# Patient Record
Sex: Male | Born: 1955
Health system: Southern US, Community
[De-identification: ages and names within clinical notes are randomized; demographics above are authoritative.]

## PROBLEM LIST (undated history)

## (undated) ENCOUNTER — Emergency Department (HOSPITAL_COMMUNITY): Payer: Medicare Other

## (undated) DIAGNOSIS — I4891 Unspecified atrial fibrillation: Secondary | ICD-10-CM

## (undated) DIAGNOSIS — Q211 Atrial septal defect: Secondary | ICD-10-CM

## (undated) DIAGNOSIS — Q2112 Patent foramen ovale: Secondary | ICD-10-CM

## (undated) DIAGNOSIS — I340 Nonrheumatic mitral (valve) insufficiency: Secondary | ICD-10-CM

## (undated) DIAGNOSIS — Z8679 Personal history of other diseases of the circulatory system: Secondary | ICD-10-CM

## (undated) DIAGNOSIS — R011 Cardiac murmur, unspecified: Secondary | ICD-10-CM

## (undated) DIAGNOSIS — I712 Thoracic aortic aneurysm, without rupture, unspecified: Secondary | ICD-10-CM

## (undated) DIAGNOSIS — F5104 Psychophysiologic insomnia: Secondary | ICD-10-CM

## (undated) DIAGNOSIS — I499 Cardiac arrhythmia, unspecified: Secondary | ICD-10-CM

## (undated) HISTORY — DX: Thoracic aortic aneurysm, without rupture, unspecified: I71.20

## (undated) HISTORY — DX: Nonrheumatic mitral (valve) insufficiency: I34.0

## (undated) HISTORY — PX: CARDIOVERSION: SHX1299

## (undated) HISTORY — DX: Patent foramen ovale: Q21.12

## (undated) HISTORY — DX: Psychophysiologic insomnia: F51.04

## (undated) HISTORY — DX: Unspecified atrial fibrillation: I48.91

## (undated) HISTORY — DX: Personal history of other diseases of the circulatory system: Z86.79

## (undated) HISTORY — DX: Thoracic aortic aneurysm, without rupture: I71.2

## (undated) HISTORY — DX: Atrial septal defect: Q21.1

---

## 2008-07-12 ENCOUNTER — Ambulatory Visit: Payer: Self-pay | Admitting: Cardiovascular Disease

## 2008-07-12 ENCOUNTER — Encounter: Payer: Self-pay | Admitting: Emergency Medicine

## 2008-07-13 ENCOUNTER — Encounter: Payer: Self-pay | Admitting: Cardiology

## 2008-07-13 ENCOUNTER — Inpatient Hospital Stay (HOSPITAL_COMMUNITY): Admission: EM | Admit: 2008-07-13 | Discharge: 2008-07-15 | Payer: Self-pay | Admitting: Cardiology

## 2008-07-16 ENCOUNTER — Ambulatory Visit: Payer: Self-pay | Admitting: Cardiology

## 2008-07-16 LAB — CONVERTED CEMR LAB: Prothrombin Time: 16.4 s — ABNORMAL HIGH (ref 10.9–13.3)

## 2008-07-19 ENCOUNTER — Ambulatory Visit: Payer: Self-pay | Admitting: Cardiology

## 2008-07-19 LAB — CONVERTED CEMR LAB
INR: 2.5 — ABNORMAL HIGH (ref 0.8–1.0)
Prothrombin Time: 27.1 s — ABNORMAL HIGH (ref 10.9–13.3)

## 2008-07-26 ENCOUNTER — Ambulatory Visit: Payer: Self-pay | Admitting: Internal Medicine

## 2008-07-26 DIAGNOSIS — G4700 Insomnia, unspecified: Secondary | ICD-10-CM | POA: Insufficient documentation

## 2008-07-26 DIAGNOSIS — I4891 Unspecified atrial fibrillation: Secondary | ICD-10-CM | POA: Insufficient documentation

## 2008-07-28 ENCOUNTER — Ambulatory Visit: Payer: Self-pay | Admitting: Cardiology

## 2008-07-29 ENCOUNTER — Ambulatory Visit: Payer: Self-pay | Admitting: Cardiology

## 2008-08-05 ENCOUNTER — Ambulatory Visit: Payer: Self-pay | Admitting: Cardiology

## 2008-10-15 ENCOUNTER — Telehealth: Payer: Self-pay | Admitting: Internal Medicine

## 2009-03-14 ENCOUNTER — Encounter (INDEPENDENT_AMBULATORY_CARE_PROVIDER_SITE_OTHER): Payer: Self-pay | Admitting: *Deleted

## 2009-04-26 ENCOUNTER — Encounter: Payer: Self-pay | Admitting: Cardiology

## 2009-05-03 ENCOUNTER — Encounter (INDEPENDENT_AMBULATORY_CARE_PROVIDER_SITE_OTHER): Payer: Self-pay | Admitting: *Deleted

## 2009-05-24 ENCOUNTER — Encounter (INDEPENDENT_AMBULATORY_CARE_PROVIDER_SITE_OTHER): Payer: Self-pay | Admitting: *Deleted

## 2009-06-17 ENCOUNTER — Ambulatory Visit: Payer: Self-pay | Admitting: Internal Medicine

## 2009-06-17 DIAGNOSIS — J029 Acute pharyngitis, unspecified: Secondary | ICD-10-CM | POA: Insufficient documentation

## 2009-08-19 ENCOUNTER — Telehealth: Payer: Self-pay | Admitting: Cardiology

## 2009-08-29 DIAGNOSIS — R002 Palpitations: Secondary | ICD-10-CM

## 2009-08-29 DIAGNOSIS — R0602 Shortness of breath: Secondary | ICD-10-CM

## 2009-08-29 DIAGNOSIS — I080 Rheumatic disorders of both mitral and aortic valves: Secondary | ICD-10-CM

## 2009-08-29 DIAGNOSIS — I340 Nonrheumatic mitral (valve) insufficiency: Secondary | ICD-10-CM | POA: Insufficient documentation

## 2009-08-31 ENCOUNTER — Ambulatory Visit: Payer: Self-pay | Admitting: Cardiology

## 2009-08-31 ENCOUNTER — Encounter: Payer: Self-pay | Admitting: Cardiology

## 2009-09-23 ENCOUNTER — Telehealth: Payer: Self-pay | Admitting: Internal Medicine

## 2009-09-29 ENCOUNTER — Ambulatory Visit: Payer: Self-pay | Admitting: Cardiology

## 2009-09-29 ENCOUNTER — Ambulatory Visit (HOSPITAL_COMMUNITY)
Admission: RE | Admit: 2009-09-29 | Discharge: 2009-09-29 | Payer: Self-pay | Source: Home / Self Care | Admitting: Cardiology

## 2009-09-29 ENCOUNTER — Ambulatory Visit (HOSPITAL_COMMUNITY): Admission: RE | Admit: 2009-09-29 | Discharge: 2009-09-29 | Payer: Self-pay | Admitting: Cardiology

## 2009-09-29 ENCOUNTER — Ambulatory Visit: Payer: Self-pay

## 2009-09-29 ENCOUNTER — Encounter: Payer: Self-pay | Admitting: Cardiology

## 2009-09-30 ENCOUNTER — Ambulatory Visit: Payer: Self-pay | Admitting: Cardiology

## 2009-10-06 ENCOUNTER — Telehealth: Payer: Self-pay | Admitting: Internal Medicine

## 2009-10-06 ENCOUNTER — Telehealth: Payer: Self-pay | Admitting: Cardiology

## 2010-01-05 ENCOUNTER — Telehealth: Payer: Self-pay | Admitting: Cardiology

## 2010-01-10 ENCOUNTER — Telehealth: Payer: Self-pay | Admitting: Cardiology

## 2010-01-10 DIAGNOSIS — I714 Abdominal aortic aneurysm, without rupture, unspecified: Secondary | ICD-10-CM | POA: Insufficient documentation

## 2010-01-14 ENCOUNTER — Encounter
Admission: RE | Admit: 2010-01-14 | Discharge: 2010-01-14 | Payer: Self-pay | Source: Home / Self Care | Admitting: Cardiology

## 2010-05-10 ENCOUNTER — Telehealth: Payer: Self-pay | Admitting: Internal Medicine

## 2010-08-16 ENCOUNTER — Encounter: Payer: Self-pay | Admitting: Cardiology

## 2010-08-16 ENCOUNTER — Ambulatory Visit: Payer: Self-pay | Admitting: Cardiology

## 2010-08-16 ENCOUNTER — Encounter: Payer: Self-pay | Admitting: Internal Medicine

## 2010-08-16 ENCOUNTER — Ambulatory Visit: Payer: Self-pay | Admitting: Internal Medicine

## 2010-08-16 DIAGNOSIS — I712 Thoracic aortic aneurysm, without rupture: Secondary | ICD-10-CM | POA: Insufficient documentation

## 2010-10-17 NOTE — Progress Notes (Signed)
Summary: calling back-regarding test  Phone Note Call from Patient Call back at Home Phone [phone]   Caller: Patient Reason for Call: Talk to Nurse, Lab or Test Results Summary of Call: calling back regarding test. Initial call taken by: Lorne Skeens,  January 05, 2010 8:54 AM  Follow-up for Phone Call        [facility] Scherrie Bateman, LPN  January 05, 2010 10:19 AM SPOKE WITH PT HAS ALOT OF BILLING QUESTIONS RE MRA OF THORACIC  AORTA NOT SCHEDULED YET PENDING  ANSWERS RE COST Follow-up by: Scherrie Bateman, LPN,  January 05, 2010 10:51 AM  Additional Follow-up for Phone Call Additional follow up Details #1::        LMTCB Scherrie Bateman, LPN  January 05, 2010 1:00 PM ATTEMPTED TO CALL PT WITH APPROX LIST OF CHARGES FOF MRA THORACIC  SCAN COSTS 1230.00 WITH WITHOUT CONTRAST CONTRAST VARIES $5.00 PER CC AND THE COST OF TEST BEING READ  Additional Follow-up by: Scherrie Bateman, LPN,  January 05, 2010 2:39 PM    Additional Follow-up for Phone Call Additional follow up Details #2::    lmtcb Scherrie Bateman, LPN  January 09, 2010 3:11 PM  returning call, Johnny [name]  January 09, 2010 4:52 PM   Additional Follow-up for Phone Call Additional follow up Details #3:: Details for Additional Follow-up Action Taken: PT AWARE OF FEES WILL CALL [employer] TO SEE WHAT WILL COVER AND CALL BACK TO SCHEDULE Additional Follow-up by: Scherrie Bateman, LPN,  January 09, 2010 4:58 PM

## 2010-10-17 NOTE — Progress Notes (Signed)
Summary: Estazolam Refill  Phone Note Refill Request Message from:  Fax from Pharmacy on September 23, 2009 11:34 AM  Refills Requested: Medication #1:  estazolam 2 mg tab   Brand Name Necessary? No   Supply Requested: 1 month   Last Refilled: 01/18/2009  Method Requested: Electronic Next Appointment Scheduled: 09-29-09 930 cardiology  Initial call taken by: Roselle Locus,  September 23, 2009 11:34 AM  Follow-up for Phone Call        ok to refill x 3 Follow-up by: [name] DO,  September 23, 2009 1:11 PM  Additional Follow-up for Phone Call Additional follow up Details #1::        Rx called to pharmacy Additional Follow-up by: Glendell Docker CMA,  September 23, 2009 4:11 PM    New/Updated Medications: ESTAZOLAM 2 MG TABS (ESTAZOLAM) Take 1 tablet by mouth once a day Prescriptions: ESTAZOLAM 2 MG TABS (ESTAZOLAM) Take 1 tablet by mouth once a day  #30 x 2   Entered by:   Glendell Docker CMA   Authorized by:   [name] DO   Signed by:   Glendell Docker CMA on 09/23/2009   Method used:   Telephoned to ...       [facility] [address] (retail)       [address]       [place], [place]  [address]       Ph: [phone] or [phone]       Fax: [phone]   RxID:   [id number]

## 2010-10-17 NOTE — Progress Notes (Signed)
Summary: Estazolam Refill  Phone Note Call from Patient Call back at Home Phone [phone]   Caller: Patient Call For: [name] DO Reason for Call: Refill Medication Summary of Call: Patient called and left voice message requesting refill on the Estazolam.  Last office visit with Dr Artist Pais was October 06/17/2009. No future appointments of file Initial call taken by: Glendell Docker CMA,  May 10, 2010 12:42 PM  Follow-up for Phone Call        refill x 1 .   inform pt - he has to been seen within 1 yr for refills Follow-up by: [name] DO,  May 11, 2010 8:35 AM  Additional Follow-up for Phone Call Additional follow up Details #1::        call returned to patient  at [phone], he was informed rx sent to pharmacy and a appointment is needed  for future refills. Patient verbalized understanding and agrees Additional Follow-up by: Glendell Docker CMA,  May 11, 2010 9:33 AM    Prescriptions: ESTAZOLAM 2 MG TABS (ESTAZOLAM) Take 1 tablet by mouth once a day  #30 x 0   Entered by:   Glendell Docker CMA   Authorized by:   [name] DO   Signed by:   Glendell Docker CMA on 05/11/2010   Method used:   Telephoned to ...       [facility] [address] (retail)       [address]       [place], [place]  [address]       Ph: [phone] or [phone]       Fax: [phone]   RxID:   [id number]

## 2010-10-17 NOTE — Progress Notes (Signed)
Summary: returning call  Phone Note Call from Patient Call back at Work Phone [phone] Call back at ext [phone]   Caller: Patient Reason for Call: Talk to Nurse Summary of Call: returning call Initial call taken by: Migdalia Dk,  October 06, 2009 8:41 AM  Follow-up for Phone Call        Discussed results of stress echo and 2D-echo with pt. Explained to him that Dr. Jens Som would like pt to have MRA for furthur evaluation. Pt aware he would need blood work prior to test. Pt wants to think about this and check with his insurance before proceeding.  Will forward message to Stanton Kidney to follow up with pt. Dossie Arbour, RN, BSN  October 06, 2009 9:30 AM   Additional Follow-up for Phone Call Additional follow up Details #1::        Left message to call back Deliah Goody, RN  October 14, 2009 4:05 PM  Left message to call back Deliah Goody, RN  October 27, 2009 3:20 PM

## 2010-10-17 NOTE — Progress Notes (Signed)
Summary: Calling about MRA  Phone Note Call from Patient Call back at [facility] Phone [phone]   Caller: Patient Summary of Call: Pt calling regarding getting a MRA Initial call taken by: Judie Grieve,  January 10, 2010 8:35 AM  Follow-up for Phone Call        spoke with pt, the pt has decided to schedule MRA to check aortic root size. Deliah Goody, RN  January 10, 2010 3:39 PM     New Problems: ABDOMINAL ANEURYSM WITHOUT MENTION OF RUPTURE (ICD-441.4)   New Problems: ABDOMINAL ANEURYSM WITHOUT MENTION OF RUPTURE (ICD-441.4)

## 2010-10-17 NOTE — Assessment & Plan Note (Signed)
Summary: Johnny [name]   Visit Type:  Follow-up Primary Johnny [name]:  Johnny [name]   History of Present Illness: Johnny [name] is a pleasant gentleman with a history of atrial fibrillation and mitral regurgitation for followup. He had a TEE-guided cardioversion on July 13, 2008.  His TEE showed normal LV function.  He had prolapse of the posterior mitral valve leaflet with moderate mitral regurgitation (2+).  He also had a patent foramen ovale.  We subsequently proceeded with cardioversion successfully to sinus rhythm.  Also note, a TSH was within normal limits of 1.237. Echocardiogram in January of 2011 revealed normal LV function, dilated aortic root, mitral valve prolapse with moderate mitral regurgitation and mild left atrial enlargement. Stress echocardiogram in January of 2011 was normal. MRA of the thoracic aorta in April of 2011 showed aortic root of 4.4 cm. I last saw him in December of [address]. Since then the patient denies any dyspnea on exertion, orthopnea, PND, pedal edema, palpitations, syncope or chest pain.   Current Medications (verified): 1)  Cardizem Cd 120 Mg Xr24h-Cap (Diltiazem Hcl Coated Beads) .Marland Kitchen.. 1 Tab By Mouth Once Daily 2)  Multivitamins  Tabs (Multiple Vitamin) .... Take 1 Tablet By Mouth Once A Day 3)  Estazolam 2 Mg Tabs (Estazolam) .... Take 1 Tablet By Mouth Once A Day As Needed  Allergies: 1)  ! * Antihistamines  Past History:  Past Medical History: MITRAL REGURGITATION (ICD-396.3) INSOMNIA, CHRONIC (ICD-307.42) ATRIAL FIBRILLATION (ICD-427.31) Rheumatic Fever age 61  patent foramen ovale Dilated aortic root  Past Surgical History: Reviewed history from 06/17/2009 and no changes required. Cardioversion    Social History: Reviewed history from 06/17/2009 and no changes required. 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 Remaining systems are negative.   Vital Signs:  Patient profile:   55 year old male Height:      74 inches Weight:      218 pounds BMI:     28.09 Pulse rate:   66 / minute Pulse rhythm:   regular Resp:     18 per minute BP sitting:   106 / 80  (left arm) Cuff size:   large  Vitals Entered By: Vikki Ports (August 16, 2010 11:20 AM)  Physical Exam  General:  Well-developed well-nourished in no acute distress.  Skin is warm and dry.  HEENT is normal.  Neck is supple. No thyromegaly.  Chest is clear to auscultation with normal expansion.  Cardiovascular exam is regular rate and rhythm.  Abdominal exam nontender or distended. No masses palpated. Extremities show no edema. neuro grossly intact    EKG  Procedure date:  08/16/2010  Findings:      Sinus Rhythm with No ST Changes.  Impression & Recommendations:  Problem # 1:  ANEURYSM, THORACIC AORTIC (ICD-441.2) Plan followup MRA of the chest in April of 2012.  Problem # 2:  MITRAL REGURGITATION (ICD-396.3) Plan followup echocardiogram in January 2012. Orders: Echocardiogram (Echo)  Problem # 3:  ATRIAL FIBRILLATION (ICD-427.31) Patient remains in sinus rhythm. Continue Cardizem. He is not routinely taking an aspirin. I explained the risk of CVA and recommended he resume aspirin. He has no embolic risk factors and therefore does not require Coumadin.  Patient Instructions: 1)  Your physician wants you to follow-up in: ONE YEAR  You will receive a reminder letter in the mail two months in advance. If you don't receive a letter, please call our office to schedule the follow-up appointment. 2)  Your physician has requested that you have an echocardiogram.  Echocardiography is a painless test that uses sound waves to create images of your heart. It  provides your doctor with information about the size and shape of your heart and how well your heart's chambers and valves are working.  This procedure takes approximately one hour. There are no restrictions for this procedure.SCHEDULED IN JAN 3)  MRA OF CHEST IN APRIL

## 2010-10-17 NOTE — Progress Notes (Signed)
Summary: Sleep aid  Phone Note Call from Patient Call back at Work Phone [phone] Call back at ext [phone]   Caller: Patient Call For: [name] DO Summary of Call: patient called and left voice message stating he had touched base with Dr Artist Pais awhile back about his sleeping difficulty and he would like to know if Dr Artist Pais would prescribe him a sleep aid. Initial call taken by: Glendell Docker CMA,  October 06, 2009 5:54 PM  Follow-up for Phone Call        patient called back checking on the status of his rx request. Follow-up by: Glendell Docker CMA,  October 07, 2009 2:39 PM  Additional Follow-up for Phone Call Additional follow up Details #1::        refill estazolam x 3 Additional Follow-up by: [name] DO,  October 07, 2009 3:03 PM    Additional Follow-up for Phone Call Additional follow up Details #2::    attempted to contact patient at [phone] answer , voice message left  informing patient rx was called to pharmacy Follow-up by: Glendell Docker CMA,  October 07, 2009 4:25 PM  Prescriptions: ESTAZOLAM 2 MG TABS (ESTAZOLAM) Take 1 tablet by mouth once a day  #30 x 3   Entered by:   Glendell Docker CMA   Authorized by:   [name] DO   Signed by:   Glendell Docker CMA on 10/07/2009   Method used:   Telephoned to ...       [facility] [address] (retail)       [address]       [place], [place]  [address]       Ph: [phone] or [phone]       Fax: [phone]   RxID:   [id number]

## 2010-10-19 NOTE — Assessment & Plan Note (Signed)
Summary: med check/mhf   Vital Signs:  Patient profile:   55 year old male Height:      74 inches Weight:      210.25 pounds BMI:     27.09 Temp:     97.8 degrees F oral Pulse rate:   66 / minute Pulse rhythm:   regular Resp:     16 per minute BP sitting:   106 / 80  (left arm) Cuff size:   large  Vitals Entered By: Mervin Kung CMA Duncan Dull) (August 16, 2010 10:30 AM) CC: Pt here for follow up on medication. Needs refill on Estazolam. Is Patient Diabetic? No Pain Assessment Patient in pain? no        Primary Care Provider:  Dondra Spry DO  CC:  Pt here for follow up on medication. Needs refill on Estazolam..  History of Present Illness: chronic insomnia no trouble getting to sleep always gets up 4 AM  recently returned from trip to the beach did not have any trouble  has been taking estazolam for last 3 yrs as needed  feels disconnected when stressed worries when "I am not in control"   Preventive Screening-Counseling & Management  Alcohol-Tobacco     Smoking Status: never  Allergies: 1)  ! 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   Lungs:  normal respiratory effort and normal breath sounds.   Heart:  normal rate, regular rhythm, and no gallop.   Psych:  normally interactive, good eye contact, not anxious appearing, and not depressed appearing.     Impression & Recommendations:  Problem # 1:  INSOMNIA, CHRONIC (ICD-307.42) Assessment  Unchanged continue estazolam as needed question underlying anxiety d/o pt declines trial of SSRI  Orders: T-Basic Metabolic Panel [phone]) T-TSH [phone])  Problem # 2:  HEALTH MAINTENANCE EXAM (ICD-V70.0) Pt never went for screening colonoscopy.  we discussed other options for colon ca screening. He wants to investigate options before making decision.  Orders: T-Basic Metabolic Panel [phone]) T-Hepatic Function [phone]) T-Lipid Profile [phone]) T-TSH [phone]) CRP, high sensitivity-[facility] ([id number])  Complete Medication List: 1)  Cardizem Cd 120 Mg Xr24h-cap (Diltiazem hcl coated beads) .Marland Kitchen.. 1 tab by mouth once daily 2)  Multivitamins Tabs (Multiple vitamin) .... Take 1 tablet by mouth once a day 3)  Estazolam 2 Mg Tabs (Estazolam) .... Take 1 tablet by mouth once a day as needed  Patient Instructions: 1)  Please schedule a follow-up appointment in 1 year. 2)  Please schedule a follow-up appointment as needed. Prescriptions: ESTAZOLAM 2 MG TABS (ESTAZOLAM) Take 1 tablet by mouth once a day as needed  #30 x 5   Entered and Authorized by:   [name] DO   Signed by:   [name] DO on 08/16/2010   Method used:   Print then Give to Patient   RxID:   [id number]    Orders Added: 1)  T-Basic Metabolic Panel [[id number]] 2)  T-Hepatic Function [[id number]] 3)  T-Lipid Profile [[id number]] 4)  T-TSH [[id number]] 5)  CRP, high sensitivity-[facility] [[id number]]  6)  Est. Patient Level III [[address]109]    Contraindications/Deferment of Procedures/Staging:    Test/Procedure: FLU VAX    Reason for deferment: patient declined   Current Allergies (reviewed today): ! * ANTIHISTAMINES

## 2011-01-30 NOTE — Discharge Summary (Signed)
NAMEAJANI, [name] NO.:  [id number]   MEDICAL RECORD NO.:  [id number]          PATIENT TYPE:  INP   LOCATION:  2033                         FACILITY:  [facility]   PHYSICIAN:  Madolyn Frieze. Jens Som, MD, FACCDATE OF BIRTH:  1955-11-14   DATE OF ADMISSION:  07/13/2008  DATE OF DISCHARGE:  07/15/2008                               DISCHARGE SUMMARY   PROCEDURES:  1. Transesophageal echocardiogram.  2. Direct-current cardioversion.  3. A 2-D echocardiogram.   PRIMARY FINAL DISCHARGE DIAGNOSIS:  Paroxysmal atrial fibrillation with  rapid ventricular response.   SECONDARY DIAGNOSES:  1. Anticoagulation with Coumadin started this admission and to      continue for 4 weeks.  2. History of rheumatic fever at age 26.  3. Allergy or intolerance to codeine and antihistamines with      depression.   TIME OF DISCHARGE:  37 minutes.   HOSPITAL COURSE:  [name] is a 55 year old male with very little  previous cardiac history.  He had 3 or 4 days of palpitations and  dyspnea on exertion.  When his symptoms did not improve, he came to the  emergency room and was found to be in AFib with rapid ventricular  response with a heart rate in the 140s.  He was admitted for further  evaluation and treatment.   He was initially rate controlled with IV Cardizem.  He remained  symptomatic in atrial fibrillation despite heart rates in the normal  range.  He was scheduled for a TEE cardioversion, which was performed on  July 13, 2008.   He had normal left ventricular function with no left atrial appendage  thrombus and a normal right atrium and right ventricle as well.  There  was no pericardial effusion.  He had prolapse of the posterior mitral  leaflet with moderate MR, which was 2+.  He had a patent foramen ovale  by color Doppler.  He was direct-current cardioverted with 120 biphasic  joules.  He went into sinus rhythm.  His Cardizem was transitioned to  p.o.   [name] was  started on Lovenox and transitioned to Coumadin.  In the  hospital, he received 10 mg on July 13, 2008, and 10 mg on July 14, 2008.  He is to take 10 mg on July 15, 2008.  His INR at  discharge is 1.2.  He is to continue the Lovenox until his INR is  therapeutic and the patient understands this.   [name] had been placed on Cardizem at 60 mg q.6 h.; however, he did  not tolerate this dose and had a vagal response with heart rates  dropping into the 40s, associated weakness and diaphoresis.  The  Cardizem dose was decreased to Cardizem CD 120 mg daily and he tolerated  this well.  Labs were checked including cardiac enzymes, which were  negative.  His BNP was slightly elevated at 614, but a chest x-ray  showed no active disease and he was not volume overloaded by exam.  A  TSH was within normal limits at 1.237 and a D-dimer was also within  normal limits at 0.23.  A lipid profile showed a total cholesterol of  154, triglycerides 100, HDL 31, and LDL 103.  Magnesium was within  normal limits as well.   On July 15, 2008, [name] was tolerating the Cardizem at 120 mg  daily.  He had no further episodes of hypotension and bradycardia.  He  had no indications of any kind of neurologic event.  Dr. Jens Som felt  that the plan should be to continue Coumadin for 4 weeks but then  discontinue it and began aspirin at 325 mg daily because his [place] score  is 0.  [name] requested an outpatient referral and he has been set up  with Dr. Artist Pais.  His echocardiogram is to be repeated in 6 months and  consideration can be given to a Myoview as an outpatient.  On July 15, 2008, Dr. Jens Som evaluated [name] and considered him stable for  discharge with close outpatient followup.   DISCHARGE INSTRUCTIONS:  1. His activity level is to be increased gradually.  He is to call our      office for any further palpitations or neurologic symptoms.  He is      to follow up with Dr. Jens Som  on July 28, 2008, at 9:45.  He      is to see Dr. Artist Pais as a new patient on July 26, 2008, at 9:30.      [facility] on July 16, 2008,      at 9:15.   DISCHARGE MEDICATIONS:  1. Mucinex p.r.n.  2. Coumadin 5 mg as directed, 2 tablets today.  3. Cardizem CD 120 mg daily.  4. Lovenox 95 mg every 12 hours until INR is greater than or equal to      2.0.      Johnny Demark, PA-C      Madolyn Frieze. Jens Som, MD, [facility]  Electronically Signed    RB/MEDQ  D:  07/15/2008  T:  07/15/2008  Job:  [id number]   cc:   Barbette Hair. Artist Pais, DO

## 2011-01-30 NOTE — H&P (Signed)
NAMELABIB, CWYNAR NO.:  [id number]   MEDICAL RECORD NO.:  [id number]          PATIENT TYPE:  INP   LOCATION:  2033                         FACILITY:  [facility]   PHYSICIAN:  Christell Faith, MD   DATE OF BIRTH:  12-Aug-1956   DATE OF ADMISSION:  07/13/2008  DATE OF DISCHARGE:                              HISTORY & PHYSICAL   CHIEF COMPLAINT:  Chest fluttering and shortness of breath.   HISTORY OF PRESENT ILLNESS:  This is a 55 year old white man, who  presented to the [facility] in [facility], complaining  of 3-4 days of chest fluttering, fast heart rate, dyspnea on exertion,  chest burning pain, and orthostatic lightheadedness.  He was found to be  in new-onset atrial fibrillation with rapid ventricular response with  rates in the 140s.  He is currently rate controlled on IV diltiazem and  is feeling much better although he is still in atrial fibrillation.  He  denies any recent fever, viral illness, travel, or angina.   PAST MEDICAL HISTORY:  Rheumatic fever at age 23.   SOCIAL HISTORY:  Lives in [place] with his wife.  He manages  [employer], has 2 daughters, lifelong nonsmoker, nondrinker,  no drugs.  He has fairly heavy consumption of caffeinated tea and plays  tennis for exercise.   FAMILY HISTORY:  Mother died of pulmonary fibrosis.  Father is alive and  has diverticulitis.   ALLERGIES:  No true allergies.  He stays away from medicines that make  him feel depressed such as codeine and antihistamine.   MEDICINE:  P.r.n. Mucinex.   REVIEW OF SYSTEMS:  Positive for 4 days of chest pain, shortness of  breath, dyspnea on exertion, presyncope, palpitations.  In general,  positive for tendency towards depression, positive for increased stress  secondary to financial situation, and positive for occasional bright red  blood per rectum.   PHYSICAL EXAMINATION:  VITAL SIGNS:  Temperature 98.4; pulse initially  149, currently  94; respiratory rate 16, blood pressure 106/57;  saturation 95% on room air; and weight 95 kg.  GENERAL:  A pleasant white man in no acute distress.  HEENT:  Pupils equal, round, and reactive to light.  Sclerae are clear.  Mucous membranes are moist.  No oral lesions.  Dentition is good.  NECK:  Supple.  Neck veins are flat.  No carotid bruits.  Carotid  upstrokes are normal with palpitation.  CARDIAC:  Rhythm is irregularly irregular.  Rate is controlled.  No  murmurs.  LUNGS:  Clear to auscultation bilaterally without wheezing or rales.  ABDOMEN:  Soft, nontender, and nondistended with no bruits.  No  hepatosplenomegaly.  EXTREMITIES:  No clubbing or edema.  2+ dorsalis pedis pulses  bilaterally.  1+ radial pulses bilaterally.  NEUROLOGIC:  Awake, alert, and oriented x3.  Cranial nerves symmetric  and normal, and 5/5 strength in all 4 extremities.   DIAGNOSTIC TESTS:  Chest x-ray shows no active disease.  EKG shows  atrial fibrillation/flutter at 127 beats per minute with no ischemic  changes.   LABORATORY:  White  blood cells 6.9, hemoglobin 14.6, and platelets 198.  Sodium 141, potassium 3.7, BUN 12, creatinine 1, and glucose 82.  CK-MB  1.1 and troponin less than 0.05.   IMPRESSION:  A 55 year old white man with new-onset atrial fibrillation.  Assuming this is not valvular related, then his CHADS2 score should be  quite low.   PLAN:  1. Admit to telemetry bed and rule out myocardial infarction by      cycling serial EKGs and cardiac enzymes.  2. TSH is pending.  3. Check transthoracic echo.  4. We will treat with IV Lovenox for now and initiate Coumadin in      anticipation of a few weeks of Coumadin post cardioversion after      which he may be able to transition to aspirin.  5. Continue IV diltiazem overnight, and if he does not spontaneously      convert, then he will be kept n.p.o. for potential TEE      cardioversion tomorrow.  6. He would prefer to take calcium  channel blockers long term as      opposed to beta blockers given the potential depressive side      effects of beta blockers.  7. Given the significant chest pain component, he may require an      outpatient stress test, and we will go ahead and check a D-dimer.      Christell Faith, MD  Electronically Signed     NDL/MEDQ  D:  07/13/2008  T:  07/13/2008  Job:  [id number]

## 2011-01-30 NOTE — Assessment & Plan Note (Signed)
[facility]   NAME:  [name], [name]              ACCOUNT NO.:  [id number]   MEDICAL RECORD NO.:  [id number]      DATE OF BIRTH:  09/21/1955   PHYSICIAN:  Madolyn Frieze. Jens Som, MD, [facility] VISIT DATE:  07/13/2008                                   OFFICE VISIT   This is cardioversion of atrial fibrillation.  The patient had a  transesophageal echocardiogram prior to the procedure that demonstrated  no left atrial appendage thrombus.  He was subsequently sedated with  Pentothal 150 mg intravenously.  Synchronized cardioversion with 120  joules (biphasic) resulted in normal sinus rhythm.  There were no  immediate complications.  We would recommend continuing the Lovenox  until the INR is therapeutic.      Madolyn Frieze Jens Som, MD, [facility]  Electronically Signed     BSC/MEDQ  D:  07/13/2008  T:  07/14/2008  Job:  [id number]

## 2011-01-30 NOTE — Assessment & Plan Note (Signed)
[facility]                            CARDIOLOGY OFFICE NOTE   NAME:Johnny [name], Johnny [name]                       MRN:          [id number]  DATE:07/28/2008                            DOB:          December 27, 1955    Johnny [name] is a 55 year old gentleman that I recently admitted to [facility].  He presented with complaints of dyspnea on exertion and  palpitations, was found to be in atrial fibrillation with a rapid  ventricular response.  He was treated with Cardizem, but remained  symptomatic.  He ultimately underwent a TEE-guided cardioversion on  July 13, 2008.  His TEE showed normal LV function.  He had prolapse  of the posterior mitral valve leaflet with moderate mitral regurgitation  (2+).  He also had a patent foramen ovale.  We subsequently proceeded  with cardioversion successfully to sinus rhythm.  He was treated with  Cardizem and Coumadin.  He did have a vagal reaction prior to discharge.  Also note, a TSH was within normal limits of 1.237.  Since he was  discharge, he is feeling well.  He denies any dyspnea, chest pain,  palpitations, or syncope, and there is no pedal edema.   PRESENT MEDICATIONS:  Coumadin as directed and Cardizem CD 120 mg p.o.  daily.   PHYSICAL EXAMINATION:  VITAL SIGNS:  Blood pressure of 118/72 and his  pulse is 86.  He weighs 212 pounds.  HEENT:  Normal.  NECK:  Supple.  CHEST:  Clear.  CARDIOVASCULAR:  Regular rate and rhythm.  ABDOMEN:  No tenderness.  EXTREMITIES:  No edema.   His electrocardiogram shows a sinus rhythm at a rate of 85.  There are  no significant ST changes noted.   DIAGNOSES:  1. Paroxysmal atrial fibrillation - Mr. Blye remains in sinus rhythm      today.  We will continue with Cardizem for rate control if his      atrial fibrillation recurs.  He will also continue on Coumadin with      a goal INR of 2-3 for two more weeks.  This will make it 4 weeks      since his recent  cardioversion.  We will then transition from      Coumadin to aspirin 325 mg p.o. daily (note, his CHADS 2 score is 0      as he has no embolic risk factors).  I think his atrial      fibrillation is most likely due to his mitral regurgitation from      his mitral valve prolapse.  If it recurs in the future, then we      will most likely add flecainide or an antiarrhythmic to maintain      sinus rhythm.  Note, I will schedule him to have a stress      echocardiogram now.  2. Mitral regurgitation - His previous transesophageal echocardiogram      showed moderate mitral regurgitation.  We will plan to proceed with      a transthoracic in 6 months when I see him back.  3. Coumadin  therapy - This is being monitored in our [facility]      and again we will      discontinue this in 2 weeks.  4. History of rheumatic fever at age 51 by his report.     Madolyn Frieze Jens Som, MD, [facility]  Electronically Signed    BSC/MedQ  DD: 07/28/2008  DT: 07/28/2008  Job #: [id number]

## 2011-02-02 ENCOUNTER — Other Ambulatory Visit: Payer: Self-pay | Admitting: *Deleted

## 2011-02-02 DIAGNOSIS — G4700 Insomnia, unspecified: Secondary | ICD-10-CM

## 2011-02-02 DIAGNOSIS — Z0000 Encounter for general adult medical examination without abnormal findings: Secondary | ICD-10-CM

## 2011-02-02 LAB — BASIC METABOLIC PANEL
Calcium: 10.3 mg/dL (ref 8.4–10.5)
Creat: 1.09 mg/dL (ref 0.40–1.50)
Potassium: 4.8 mEq/L (ref 3.5–5.3)
Sodium: 142 mEq/L (ref 135–145)

## 2011-02-02 LAB — HEPATIC FUNCTION PANEL
ALT: 31 U/L (ref 0–53)
AST: 26 U/L (ref 0–37)
Bilirubin, Direct: 0.2 mg/dL (ref 0.0–0.3)
Total Bilirubin: 1 mg/dL (ref 0.3–1.2)

## 2011-02-02 LAB — LIPID PANEL
HDL: 42 mg/dL (ref >39)
Total CHOL/HDL Ratio: 4.2 Ratio

## 2011-02-02 LAB — TSH: TSH: 1.027 u[IU]/mL (ref 0.350–4.500)

## 2011-02-23 ENCOUNTER — Telehealth: Payer: Self-pay | Admitting: Internal Medicine

## 2011-02-23 NOTE — Telephone Encounter (Signed)
New rx- estazolam tabs 2mg .   Pt requesting 90 day supply with appropriate refills.

## 2011-02-27 MED ORDER — ESTAZOLAM 2 MG PO TABS: 2.0000 mg | ORAL_TABLET | Freq: Every day | ORAL | Status: DC

## 2011-02-27 NOTE — Telephone Encounter (Signed)
Okay for #90, RF x 2.  --PM

## 2011-02-27 NOTE — Telephone Encounter (Signed)
Call placed to [facility] [phone], spoke with pharmacy tech Katrina B. Verbal order provided on Estazolam 2 mg.

## 2011-03-04 ENCOUNTER — Encounter: Payer: Self-pay | Admitting: Family

## 2011-03-07 ENCOUNTER — Telehealth: Payer: Self-pay | Admitting: *Deleted

## 2011-03-07 NOTE — Telephone Encounter (Signed)
Left message for pt to call to schedule MRA of the chest to f/u dilated aortic root Deliah Goody

## 2011-04-05 ENCOUNTER — Encounter: Payer: Self-pay | Admitting: *Deleted

## 2011-04-05 NOTE — Telephone Encounter (Signed)
Patient is not returning our calls, will send him a letter Johnny [name]

## 2011-06-19 LAB — POCT CARDIAC MARKERS: CKMB, poc: 1.1

## 2011-06-19 LAB — PROTIME-INR
INR: 1
INR: 1.1
INR: 1.1
INR: 1.2
Prothrombin Time: 14.4

## 2011-06-19 LAB — CBC
HCT: 46.6
Hemoglobin: 14.5
Hemoglobin: 15.3
MCHC: 33.9
MCHC: 34
MCV: 91.6
Platelets: 170
Platelets: 183
Platelets: 184
RDW: 13.3
RDW: 13.4
RDW: 13.8
WBC: 5.4
WBC: 6.6
WBC: 6.9
WBC: 6.9

## 2011-06-19 LAB — BASIC METABOLIC PANEL
BUN: 12
Calcium: 9.2
Chloride: 105
GFR calc non Af Amer: >60
Glucose, Bld: 82
Potassium: 3.7
Sodium: 141

## 2011-06-19 LAB — COMPREHENSIVE METABOLIC PANEL
Albumin: 3.6
BUN: 12
Calcium: 9.3
Chloride: 108
Creatinine, Ser: 0.89
Total Bilirubin: 1.1

## 2011-06-19 LAB — CK TOTAL AND CKMB (NOT AT ARMC)
Relative Index: INVALID
Total CK: 52
Total CK: 67

## 2011-06-19 LAB — DIFFERENTIAL
Eosinophils Absolute: 0.1
Eosinophils Relative: 2
Monocytes Relative: 8

## 2011-06-19 LAB — D-DIMER, QUANTITATIVE: D-Dimer, Quant: 0.23

## 2011-06-19 LAB — LIPID PANEL
Cholesterol: 154
HDL: 31 — ABNORMAL LOW
Total CHOL/HDL Ratio: 5
VLDL: 20

## 2011-06-19 LAB — GLUCOSE, CAPILLARY: Glucose-Capillary: 165 — ABNORMAL HIGH

## 2011-06-19 LAB — B-NATRIURETIC PEPTIDE (CONVERTED LAB): Pro B Natriuretic peptide (BNP): 614 — ABNORMAL HIGH

## 2011-08-02 ENCOUNTER — Ambulatory Visit (INDEPENDENT_AMBULATORY_CARE_PROVIDER_SITE_OTHER): Payer: 59 | Admitting: Internal Medicine

## 2011-08-02 ENCOUNTER — Encounter: Payer: Self-pay | Admitting: Internal Medicine

## 2011-08-02 VITALS — BP 90/60 | HR 67 | Temp 97.3°F | Resp 18 | Wt 209.0 lb

## 2011-08-02 DIAGNOSIS — F411 Generalized anxiety disorder: Secondary | ICD-10-CM | POA: Insufficient documentation

## 2011-08-02 DIAGNOSIS — F419 Anxiety disorder, unspecified: Secondary | ICD-10-CM | POA: Insufficient documentation

## 2011-08-02 DIAGNOSIS — G4700 Insomnia, unspecified: Secondary | ICD-10-CM | POA: Insufficient documentation

## 2011-08-02 MED ORDER — ESCITALOPRAM OXALATE 10 MG PO TABS: 10.0000 mg | ORAL_TABLET | Freq: Every day | ORAL | Status: DC

## 2011-08-02 MED ORDER — ESTAZOLAM 2 MG PO TABS: 2.0000 mg | ORAL_TABLET | Freq: Every day | ORAL | Status: DC

## 2011-08-02 NOTE — Assessment & Plan Note (Signed)
Reviewed prosom as a benzodiazepine with potential for addiction and/or tolerance. Recommend restraining dose to 2mg  qhs. Suspect contribution from anxiety.

## 2011-08-02 NOTE — Assessment & Plan Note (Signed)
Recommend attempt low dose lexapro 10mg  po qd. Schedule close follow up for 3-4 wks.

## 2011-08-02 NOTE — Progress Notes (Signed)
  Subjective:    Patient ID: Johnny [name], male    DOB: 1955-11-25, 55 y.o.   MRN: [id number]  HPI Pt presents to clinic for evaluation of insomnia. Notes long standing h/o insomnia treated with estazolam 2mg  qhs. Recently has self increased to 3mg  qhs and has intermittently suboptimal control. Relates significant stressors primarily work/financial related. Has associated stress and anxiety daily as a result. Feels better by Saturday however Sunday begins to note sx's again in anticipation of the week beginning. H/o afib, mitral regurgitation and dilated aortic root followed by cardiology. States is in the process of scheduling follow up with cardiology. Total time of visit ~35 of which >50% of time spent in counseling.  Past Medical History  Diagnosis Date  . Mitral regurgitation   . Chronic insomnia   . Atrial fibrillation   . History of rheumatic fever     age 24  . Patent foramen ovale   . Dilated aortic root    Past Surgical History  Procedure Date  . Cardioversion     reports that he has never smoked. He has never used smokeless tobacco. He reports that he does not drink alcohol or use illicit drugs. family history includes Diverticulosis in his father and Pulmonary fibrosis in his mother. No Known Allergies   Review of Systems see hpi     Objective:   Physical Exam  Nursing note and vitals reviewed. Constitutional: He appears well-developed and well-nourished. No distress.  Skin: He is not diaphoretic.  Psychiatric: He has a normal mood and affect. His speech is normal and behavior is normal. Thought content normal.          Assessment & Plan:

## 2011-08-15 ENCOUNTER — Encounter: Payer: Self-pay | Admitting: Internal Medicine

## 2011-08-15 ENCOUNTER — Ambulatory Visit (INDEPENDENT_AMBULATORY_CARE_PROVIDER_SITE_OTHER): Payer: 59 | Admitting: Internal Medicine

## 2011-08-15 DIAGNOSIS — F411 Generalized anxiety disorder: Secondary | ICD-10-CM

## 2011-08-15 DIAGNOSIS — J40 Bronchitis, not specified as acute or chronic: Secondary | ICD-10-CM | POA: Insufficient documentation

## 2011-08-15 DIAGNOSIS — F419 Anxiety disorder, unspecified: Secondary | ICD-10-CM

## 2011-08-15 MED ORDER — DOXYCYCLINE HYCLATE 100 MG PO TABS: 100.0000 mg | ORAL_TABLET | Freq: Two times a day (BID) | ORAL | Status: AC

## 2011-08-15 NOTE — Assessment & Plan Note (Signed)
Improved without need for medication.

## 2011-08-15 NOTE — Assessment & Plan Note (Signed)
Begin abx tx to completion. Continue otc cold medication prn. Followup if no improvement or worsening.

## 2011-08-15 NOTE — Progress Notes (Signed)
  Subjective:    Patient ID: Johnny [name], male    DOB: 1956/08/16, 55 y.o.   MRN: [id number]  HPI Pt presents to clinic for evaluation of cough. Notes ~7d h/o cough productive for brown/gray sputum without hemoptysis with associated chest and nasal congestion. +possible mild subjective fever without chills. Is taking otc cold medication. No alleviating or exacerbating factors. Feels no improvement of sxs. Also last seen to discuss insomnia and significant external stressors. Did not begin lexapro. Feels despite stressors is better able to control them and no current need of medication. Sleep may recently be improved but somewhat related to fatigue/malaise of URI. No other complaints.  Past Medical History  Diagnosis Date  . Mitral regurgitation   . Chronic insomnia   . Atrial fibrillation   . History of rheumatic fever     age 59  . Patent foramen ovale   . Dilated aortic root    Past Surgical History  Procedure Date  . Cardioversion     reports that he has never smoked. He has never used smokeless tobacco. He reports that he does not drink alcohol or use illicit drugs. family history includes Diverticulosis in his father and Pulmonary fibrosis in his mother. No Known Allergies   Review of Systems see hpi     Objective:   Physical Exam  Nursing note and vitals reviewed. Constitutional: He appears well-developed and well-nourished. No distress.  HENT:  Head: Normocephalic and atraumatic.  Right Ear: Tympanic membrane, external ear and ear canal normal.  Left Ear: Tympanic membrane, external ear and ear canal normal.  Nose: Mucosal edema and rhinorrhea present.  Mouth/Throat: Uvula is midline and mucous membranes are normal. Posterior oropharyngeal erythema present. No oropharyngeal exudate or posterior oropharyngeal edema.  Eyes: Conjunctivae are normal. Right eye exhibits no discharge. Left eye exhibits no discharge. No scleral icterus.  Neck: Neck supple.  Cardiovascular:  Normal rate, regular rhythm and normal heart sounds.   Pulmonary/Chest: Effort normal and breath sounds normal.  Neurological: He is alert.  Skin: Skin is warm and dry. He is not diaphoretic.  Psychiatric: He has a normal mood and affect.          Assessment & Plan:

## 2011-09-14 ENCOUNTER — Other Ambulatory Visit: Payer: Self-pay | Admitting: *Deleted

## 2011-09-14 MED ORDER — DILTIAZEM HCL ER COATED BEADS 120 MG PO CP24: 120.0000 mg | ORAL_CAPSULE | Freq: Every day | ORAL | Status: DC

## 2011-11-14 ENCOUNTER — Other Ambulatory Visit: Payer: Self-pay | Admitting: *Deleted

## 2011-11-14 ENCOUNTER — Encounter: Payer: Self-pay | Admitting: Internal Medicine

## 2011-11-14 MED ORDER — ESTAZOLAM 2 MG PO TABS: 2.0000 mg | ORAL_TABLET | Freq: Every day | ORAL | Status: DC

## 2011-11-14 NOTE — Telephone Encounter (Signed)
Rx faxed to [facility] on [address] per Dr Rodena Medin approval.

## 2011-11-14 NOTE — Progress Notes (Signed)
This encounter was created in error - please disregard.

## 2012-02-06 ENCOUNTER — Encounter: Payer: Self-pay | Admitting: Internal Medicine

## 2012-02-07 ENCOUNTER — Other Ambulatory Visit: Payer: Self-pay | Admitting: Internal Medicine

## 2012-02-07 MED ORDER — TROCHE BASE POWD: Status: DC

## 2012-03-16 ENCOUNTER — Other Ambulatory Visit: Payer: Self-pay | Admitting: Cardiology

## 2012-03-17 ENCOUNTER — Telehealth: Payer: Self-pay | Admitting: Internal Medicine

## 2012-03-17 MED ORDER — ESTAZOLAM 2 MG PO TABS: 2.0000 mg | ORAL_TABLET | Freq: Every day | ORAL | Status: DC

## 2012-03-17 NOTE — Telephone Encounter (Signed)
Rx done/SLS 

## 2012-03-17 NOTE — Telephone Encounter (Signed)
Refill-estazolam 2mg  tab. Take one tablet by mouth at bedtime. Qty 30 last fill 5.29.13

## 2012-03-17 NOTE — Telephone Encounter (Signed)
#  30 rf3 

## 2012-04-15 ENCOUNTER — Other Ambulatory Visit: Payer: Self-pay | Admitting: *Deleted

## 2012-04-15 NOTE — Telephone Encounter (Signed)
Received fax from [facility] requesting refill of pt's estazolam. Left detailed message on pharmacy voicemail to use refills on file from 03/17/12 or call and let us know if we are not allowed to give additional refills beyond 30 days of this medication at a time.

## 2012-09-03 ENCOUNTER — Telehealth: Payer: Self-pay | Admitting: Internal Medicine

## 2012-09-03 NOTE — Telephone Encounter (Signed)
#  30 rf3. Hasn't been seen in a year. Needs non urgent f/u

## 2012-09-03 NOTE — Telephone Encounter (Signed)
Refill- estazolam 2mg  tablets. Take one tablet by mouth at bedtime. Qty 30 last fill 11.15.13

## 2012-09-04 MED ORDER — ESTAZOLAM 2 MG PO TABS: 2.0000 mg | ORAL_TABLET | Freq: Every day | ORAL | Status: DC

## 2012-09-04 NOTE — Telephone Encounter (Signed)
Rx to pharmacy-*Patient Overdue for Office Visit*/SLS [facility] with contact name & number to inform pt follow-up OV due; please call office to schedule/SLS

## 2013-01-08 ENCOUNTER — Other Ambulatory Visit: Payer: Self-pay | Admitting: Internal Medicine

## 2013-01-08 NOTE — Telephone Encounter (Signed)
Estazolam request denied as pt has not been seen since 08/15/11. Please call pt to arrange follow up.

## 2013-01-15 NOTE — Telephone Encounter (Signed)
Left message for patient to return my call.

## 2013-01-19 ENCOUNTER — Telehealth: Payer: Self-pay | Admitting: Internal Medicine

## 2013-01-19 NOTE — Telephone Encounter (Signed)
Patient scheduled an appointment for 01/20/13

## 2013-01-19 NOTE — Telephone Encounter (Signed)
Opened in error

## 2013-01-20 ENCOUNTER — Encounter: Payer: Self-pay | Admitting: Family

## 2013-01-20 ENCOUNTER — Ambulatory Visit (INDEPENDENT_AMBULATORY_CARE_PROVIDER_SITE_OTHER): Payer: BC Managed Care – PPO | Admitting: Family

## 2013-01-20 VITALS — BP 110/80 | HR 60 | Temp 97.5°F | Resp 18 | Wt 217.0 lb

## 2013-01-20 DIAGNOSIS — I059 Rheumatic mitral valve disease, unspecified: Secondary | ICD-10-CM

## 2013-01-20 DIAGNOSIS — I712 Thoracic aortic aneurysm, without rupture: Secondary | ICD-10-CM

## 2013-01-20 DIAGNOSIS — I340 Nonrheumatic mitral (valve) insufficiency: Secondary | ICD-10-CM

## 2013-01-20 DIAGNOSIS — E7212 Methylenetetrahydrofolate reductase deficiency: Secondary | ICD-10-CM

## 2013-01-20 DIAGNOSIS — G4700 Insomnia, unspecified: Secondary | ICD-10-CM

## 2013-01-20 DIAGNOSIS — E7210 Disorders of sulfur-bearing amino-acid metabolism, unspecified: Secondary | ICD-10-CM

## 2013-01-20 DIAGNOSIS — F419 Anxiety disorder, unspecified: Secondary | ICD-10-CM

## 2013-01-20 DIAGNOSIS — I714 Abdominal aortic aneurysm, without rupture: Secondary | ICD-10-CM

## 2013-01-20 DIAGNOSIS — F411 Generalized anxiety disorder: Secondary | ICD-10-CM

## 2013-01-20 MED ORDER — ESTAZOLAM 2 MG PO TABS: 2.0000 mg | ORAL_TABLET | Freq: Every day | ORAL | Status: DC

## 2013-01-20 NOTE — Assessment & Plan Note (Addendum)
Recommended that he add baby aspirin 81mg  once daily for cardiac protection.  Also recommended that he schedule fasting physical so we can review preventative care measures and also so that we can also check flp- he declines.  He says that he "already had 6000 dollars worth of lab work and I have signed a release." Will request records.

## 2013-01-20 NOTE — Patient Instructions (Addendum)
Schedule a follow up appointment with Dr.Crenshaw. You will be contact about your MRA chest and echocardiogram.  Please let us know if you have not heard back within 1 week about your referral.

## 2013-01-20 NOTE — Progress Notes (Signed)
Subjective:    Patient ID: Johnny [name], male    DOB: 1956-05-14, 57 y.o.   MRN: [id number]  HPI Johnny [name] is a 57 yr old male who presents today for follow up.  Johnny [name] has not been seen in >1 year.    Anxiety- reports that this is generally well controlled.  Gets down sometimes about some business stressors but notes that Johnny [name] is constantly reminded about all things Johnny [name] is thankful for.  Insomnia- requesting refill on Prosom. Using every night.  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    Johnny [name] has hx of AF- denies palpitations.   Review of Systems See HPI  Past Medical History  Diagnosis Date  . Mitral regurgitation   . Chronic insomnia   . Atrial fibrillation   . History of rheumatic fever     age 26  . Patent foramen ovale   . Dilated aortic root     History   Social History  . Marital Status: Married    Spouse Name: N/A    Number of Children: N/A  . Years of Education: N/A   Occupational History  . Not on file.   Social History Main Topics  . Smoking status: Never Smoker   . Smokeless tobacco: Never Used  . Alcohol Use: No  . Drug Use: No  . Sexually Active: Not on file   Other Topics Concern  . Not on file   Social History Narrative   Manages [employer]   Married   2 daughters ages 61, 65          Past Surgical History  Procedure Laterality Date  . Cardioversion      Family History  Problem Relation Age of Onset  . Pulmonary fibrosis Mother   . Diverticulosis Father     Allergies  Allergen Reactions  . Antihistamines, Chlorpheniramine-Type Other (See Comments)    Acts as a depressant with pt.    Current Outpatient Prescriptions on File Prior to Visit  Medication Sig Dispense Refill  . diltiazem (CARDIZEM CD) 120 MG 24 hr capsule TAKE ONE CAPSULE BY MOUTH ONE TIME  DAILY  30 capsule  12  . estazolam (PROSOM) 2 MG tablet Take 1 tablet (2 mg total) by mouth at bedtime.  30 tablet  3  . Multiple Vitamin (MULTIVITAMIN) tablet Take 1 tablet by mouth daily.        . Troche Base POWD Compounded MMD (TROCHE) Methylcobalamin 5 mg/Methyfolate 5 mg/Vitamin D3 5,000IU take 1 Troche daily  1 g  prn   No current facility-administered medications on file prior to visit.    BP 110/80  Pulse 60  Temp(Src) 97.5 F (36.4 C) (Oral)  Resp 18  Wt 217 lb (98.431 kg)  BMI 27.85 kg/m2  SpO2 95%       Objective:   Physical Exam  Constitutional: Johnny [name] is oriented to person, place, and time. Johnny [name] appears well-developed and well-nourished. No distress.  Cardiovascular: Normal rate and regular rhythm.   No murmur heard. Pulmonary/Chest: Effort normal and breath sounds normal. No respiratory distress. Johnny [name] has no wheezes. Johnny [name] has no rales. Johnny [name] exhibits no tenderness.  Musculoskeletal: Johnny [name] exhibits no edema.  Neurological: Johnny [name] is alert and oriented to person, place, and time.  Psychiatric: Johnny [name] has a normal mood and affect. His behavior is  normal. Judgment and thought content normal.          Assessment & Plan:

## 2013-01-20 NOTE — Assessment & Plan Note (Signed)
Stable  monitor

## 2013-01-20 NOTE — Assessment & Plan Note (Signed)
Stable, continue prosom, refill provided today.

## 2013-01-20 NOTE — Assessment & Plan Note (Signed)
Noted thoracic aneurysm per Dr. Jens Som noted 2011.  Pt did not follow up as recommended for echo/f/u MRA in 2012.  Recommended follow up with Dr. Jens Som. He does not wish to see him back until these follow up tests are completed.  Will order.

## 2013-01-23 ENCOUNTER — Telehealth: Payer: Self-pay | Admitting: Family

## 2013-01-23 NOTE — Telephone Encounter (Signed)
OK.  I will let Dr. Jens Som order these tests.  He does not have appointment with Dr. Jens Som, could you pls help him arrange? He is already established. Thanks.

## 2013-01-23 NOTE — Telephone Encounter (Signed)
Call to  pt he wants to see  Dr Jens Som before he schedules  Echo.     Because Dr Jens Som had order the last MRA  He also may wait until after his appointment with Dr Jens Som  To schedule MRA, .  Patient will call back

## 2013-01-27 ENCOUNTER — Encounter: Payer: Self-pay | Admitting: Family

## 2013-01-27 ENCOUNTER — Telehealth: Payer: Self-pay | Admitting: Family

## 2013-01-27 DIAGNOSIS — R739 Hyperglycemia, unspecified: Secondary | ICD-10-CM | POA: Insufficient documentation

## 2013-01-27 DIAGNOSIS — R7301 Impaired fasting glucose: Secondary | ICD-10-CM | POA: Insufficient documentation

## 2013-01-27 NOTE — Telephone Encounter (Signed)
Could you please call pt and arrange a follow up appointment with Dr. Jens Som?

## 2013-01-27 NOTE — Telephone Encounter (Signed)
Received medical records from [facility]  P: [phone] F: [phone]

## 2013-01-28 NOTE — Telephone Encounter (Signed)
Appointment scheduled with Dr. Jens Som for 02/04/13.

## 2013-01-29 NOTE — Telephone Encounter (Signed)
Patient returned phone call stating that he was confused about who orders which testing. He said that he wanted to see Dr. Jens Som after the testing is done. Transferred phone call to [place].

## 2013-01-29 NOTE — Telephone Encounter (Signed)
Left detailed message for patient to return my call to confirm this appointment

## 2013-02-03 ENCOUNTER — Telehealth: Payer: Self-pay | Admitting: Family

## 2013-02-03 ENCOUNTER — Ambulatory Visit
Admission: RE | Admit: 2013-02-03 | Discharge: 2013-02-03 | Disposition: A | Discharge status: Home / Self Care | Payer: No Typology Code available for payment source | Source: Ambulatory Visit | Attending: Family | Admitting: Family

## 2013-02-03 DIAGNOSIS — I714 Abdominal aortic aneurysm, without rupture: Secondary | ICD-10-CM

## 2013-02-03 MED ORDER — GADOBENATE DIMEGLUMINE 529 MG/ML IV SOLN
19.0000 mL | Freq: Once | INTRAVENOUS | Status: AC | PRN
  Administered 2013-02-03: 19 mL via INTRAVENOUS

## 2013-02-03 NOTE — Telephone Encounter (Signed)
Message copied by Sandford Craze on Tue Feb 03, 2013  4:55 PM ------      Message from: Eulah Pont      Created: Tue Feb 03, 2013  9:08 AM         Patient is scheduled for MRI   Tonight   GI,  He has appt with Dr Jens Som tomorrow here in [facility]  , Note states pt wants to wait until he sees Dr Jens Som to schedule 2 D Echo.      ----- Message -----         From: Sandford Craze, NP         Sent: 02/03/2013   8:16 AM           To: Vilinda Flake,            What is status of pt's 2d echo scheduling? thanks       ------

## 2013-02-04 ENCOUNTER — Encounter: Payer: Self-pay | Admitting: Cardiology

## 2013-02-04 ENCOUNTER — Ambulatory Visit (INDEPENDENT_AMBULATORY_CARE_PROVIDER_SITE_OTHER): Payer: BC Managed Care – PPO | Admitting: Cardiology

## 2013-02-04 VITALS — BP 124/80 | HR 68 | Ht 74.0 in | Wt 208.0 lb

## 2013-02-04 DIAGNOSIS — I080 Rheumatic disorders of both mitral and aortic valves: Secondary | ICD-10-CM

## 2013-02-04 DIAGNOSIS — I712 Thoracic aortic aneurysm, without rupture: Secondary | ICD-10-CM

## 2013-02-04 DIAGNOSIS — I4891 Unspecified atrial fibrillation: Secondary | ICD-10-CM

## 2013-02-04 NOTE — Progress Notes (Signed)
   HPI: [name] is a pleasant gentleman with a history of atrial fibrillation and mitral regurgitation for followup. He had a TEE-guided cardioversion on July 13, 2008.  His TEE showed normal LV function.  He had prolapse of the posterior mitral valve leaflet with moderate mitral regurgitation (2+).  He also had a patent foramen ovale.  We subsequently proceeded with cardioversion successfully to sinus rhythm.  Also note, a TSH was within normal limits of 1.237. Echocardiogram in January of 2011 revealed normal LV function, dilated aortic root, mitral valve prolapse with moderate mitral regurgitation and mild left atrial enlargement. Stress echocardiogram in January of 2011 was normal. Note we recommended followup echocardiogram and MRA of his thoracic aorta in January of 2012. He did not followup with the studies. MRA of the thoracic aorta repeated in May of 2014. This showed his aneurysm had increased from 4.4 cm to 5.0 cm since 2011. The ascending aorta was 3.7 cm. I have not seen him since November of 2011. Since he was last seen, the patient denies any dyspnea on exertion, orthopnea, PND, pedal edema, palpitations, syncope or chest pain.  Current Outpatient Prescriptions  Medication Sig Dispense Refill  . diltiazem (CARDIZEM CD) 120 MG 24 hr capsule TAKE ONE CAPSULE BY MOUTH ONE TIME DAILY  30 capsule  12  . estazolam (PROSOM) 2 MG tablet Take 1 tablet (2 mg total) by mouth at bedtime.  30 tablet  0  . Methylcobalamin POWD Inject 1 mL into the muscle 2 (two) times a week.      . Multiple Vitamin (MULTIVITAMIN) tablet Take 1 tablet by mouth daily.        . Troche [employer] POWD Compounded MMD (TROCHE) Methylcobalamin 5 mg/Methyfolate 5 mg/Vitamin D3 5,000IU take 1 Troche daily  1 g  prn   No current facility-administered medications for this visit.     Past Medical History  Diagnosis Date  . Mitral regurgitation   . Chronic insomnia   . Atrial fibrillation   . History of rheumatic fever    age 57  . Patent foramen ovale   . Thoracic aortic aneurysm     Past Surgical History  Procedure Laterality Date  . Cardioversion      History   Social History  . Marital Status: Married    Spouse Name: N/A    Number of Children: N/A  . Years of Education: N/A   Occupational History  . Not on file.   Social History Main Topics  . Smoking status: Never Smoker   . Smokeless tobacco: Never Used  . Alcohol Use: No  . Drug Use: No  . Sexually Active: Not on file   Other Topics Concern  . Not on file   Social History Narrative   Manages [employer]   Married   2 daughters ages 53, 20          ROS: no fevers or chills, productive cough, hemoptysis, dysphasia, odynophagia, melena, hematochezia, dysuria, hematuria, rash, seizure activity, orthopnea, PND, pedal edema, claudication. Remaining systems are negative.  Physical Exam: Well-developed well-nourished in no acute distress.  Skin is warm and dry.  HEENT is normal.  Neck is supple.  Chest is clear to auscultation with normal expansion.  Cardiovascular exam is regular rate and rhythm. 2/6 systolic murmur apex Abdominal exam nontender or distended. No masses palpated. Extremities show no edema. neuro grossly intact  ECG sinus rhythm at a rate of 68. Probable early repolarization abnormality.

## 2013-02-04 NOTE — Assessment & Plan Note (Signed)
Patient remains in sinus rhythm. Continue Cardizem. I have asked him to take aspirin. He has no other embolic risk factors.

## 2013-02-04 NOTE — Patient Instructions (Addendum)
Your physician recommends that you schedule a follow-up appointment in: November 2014 AFTER MRA  Your physician has requested that you have an echocardiogram. Echocardiography is a painless test that uses sound waves to create images of your heart. It provides your doctor with information about the size and shape of your heart and how well your heart's chambers and valves are working. This procedure takes approximately one hour. There are no restrictions for this procedure.  MRA OF THE CHEST November 2014

## 2013-02-04 NOTE — Assessment & Plan Note (Signed)
Patient's thoracic aortic aneurysm has increased in size to 5 cm. I will plan to repeat an MRA in 6 months. If it increases to greater than 5.5 cm or shows significant increased rate of growth we will need to consider referral for aortic root replacement. Note he does not have a history of Marfan's and there is no family history of aneurysms.

## 2013-02-04 NOTE — Assessment & Plan Note (Signed)
Repeat echocardiogram to assess mitral valve prolapse mitral regurgitation.

## 2013-02-10 ENCOUNTER — Other Ambulatory Visit (HOSPITAL_COMMUNITY): Payer: Self-pay | Admitting: Cardiology

## 2013-02-10 DIAGNOSIS — I059 Rheumatic mitral valve disease, unspecified: Secondary | ICD-10-CM

## 2013-02-12 ENCOUNTER — Other Ambulatory Visit (HOSPITAL_COMMUNITY): Payer: BC Managed Care – PPO

## 2013-02-16 ENCOUNTER — Other Ambulatory Visit: Payer: Self-pay | Admitting: Family

## 2013-02-16 NOTE — Telephone Encounter (Signed)
Prosom last filled 01/20/13, #30 x no refills. Please advise re: refills.

## 2013-02-16 NOTE — Telephone Encounter (Signed)
OK to sen #30 no refills.

## 2013-02-17 MED ORDER — ESTAZOLAM 2 MG PO TABS: ORAL_TABLET | ORAL | Status: DC

## 2013-02-17 NOTE — Telephone Encounter (Signed)
Med phoned in °

## 2013-02-19 ENCOUNTER — Encounter: Payer: Self-pay | Admitting: *Deleted

## 2013-03-09 ENCOUNTER — Telehealth: Payer: Self-pay | Admitting: Family

## 2013-03-09 NOTE — Telephone Encounter (Signed)
Refill- mmd raspberry 5/5/5000IU troche. Park 1 troche inside of mouth and let dissolve once daily. Qty 30 last fill 5.22.14

## 2013-03-10 MED ORDER — TROCHE BASE POWD: Status: DC

## 2013-03-13 ENCOUNTER — Other Ambulatory Visit: Payer: Self-pay | Admitting: Family

## 2013-03-13 NOTE — Telephone Encounter (Signed)
Medication name:  Name from pharmacy:  estazolam (PROSOM) 2 MG tablet  ESTAZOLAM 2MG  TABLETS Sig: TAKE 1 TABLET BY MOUTH AT BEDTIME Dispense: 30 tablet Refills: 0 Start: 03/13/2013 Class: Normal Requested on: 03/13/2013 Originally ordered on: 02/27/2011 Last refill: 02/17/2013

## 2013-03-13 NOTE — Telephone Encounter (Signed)
OK to send refill- do not fill until 7/2.

## 2013-03-13 NOTE — Telephone Encounter (Signed)
Refill called to pharmacy voicemail. 

## 2013-04-27 ENCOUNTER — Other Ambulatory Visit: Payer: Self-pay | Admitting: *Deleted

## 2013-04-27 MED ORDER — DILTIAZEM HCL ER COATED BEADS 120 MG PO CP24: ORAL_CAPSULE | ORAL | Status: DC

## 2013-04-29 ENCOUNTER — Other Ambulatory Visit: Payer: Self-pay | Admitting: Family

## 2013-04-29 NOTE — Telephone Encounter (Signed)
Refill request for estazolam Last filled by MD on - 03/13/13 #30 x0 Last seen- 01/20/13 Next appt: none Please advise refill?

## 2013-04-29 NOTE — Telephone Encounter (Signed)
OK to send #30 no refills.  

## 2013-04-30 NOTE — Telephone Encounter (Signed)
Rx called to pharmacy voicemail. 

## 2013-05-29 ENCOUNTER — Other Ambulatory Visit: Payer: Self-pay | Admitting: Family

## 2013-05-29 NOTE — Telephone Encounter (Signed)
OK to send 30 tabs zero refills please.  

## 2013-05-29 NOTE — Telephone Encounter (Signed)
Refill called to pharmacy voicemail. 

## 2013-06-29 ENCOUNTER — Other Ambulatory Visit: Payer: Self-pay | Admitting: Family

## 2013-06-29 NOTE — Telephone Encounter (Signed)
OK to send 1 refill.  Should be seen back in November for 6 month follow up please.

## 2013-06-29 NOTE — Telephone Encounter (Signed)
Estazolam refill last given on 05/29/13. Pt last seen in may and has no follow ups on record.  Please advise re: refill and follow up appt?

## 2013-06-30 NOTE — Telephone Encounter (Signed)
Rx called to pharmacy voicemail. 

## 2013-06-30 NOTE — Telephone Encounter (Signed)
Please call pt to arrange appt as advised below. 

## 2013-06-30 NOTE — Telephone Encounter (Signed)
Left detailed message informing patient of medication refill and that he needs to call our office to scheduled 6 month follow up for sometime in November.

## 2013-07-01 NOTE — Telephone Encounter (Signed)
Left message for patient to return my call.

## 2013-07-06 NOTE — Telephone Encounter (Signed)
Left message for patient to return my call.

## 2013-07-23 ENCOUNTER — Other Ambulatory Visit: Payer: Self-pay

## 2013-07-29 ENCOUNTER — Other Ambulatory Visit: Payer: Self-pay | Admitting: Family

## 2013-07-29 NOTE — Telephone Encounter (Signed)
Ok to send 30 tabs zero refills.  Needs OV please prior to additional refills.

## 2013-07-31 ENCOUNTER — Encounter: Payer: Self-pay | Admitting: Family

## 2013-07-31 ENCOUNTER — Telehealth: Payer: Self-pay | Admitting: Family

## 2013-07-31 ENCOUNTER — Ambulatory Visit (INDEPENDENT_AMBULATORY_CARE_PROVIDER_SITE_OTHER): Payer: BC Managed Care – PPO | Admitting: Family

## 2013-07-31 VITALS — BP 116/80 | HR 73 | Temp 97.5°F | Resp 16 | Wt 217.1 lb

## 2013-07-31 DIAGNOSIS — I714 Abdominal aortic aneurysm, without rupture: Secondary | ICD-10-CM

## 2013-07-31 DIAGNOSIS — I4891 Unspecified atrial fibrillation: Secondary | ICD-10-CM

## 2013-07-31 DIAGNOSIS — G4700 Insomnia, unspecified: Secondary | ICD-10-CM

## 2013-07-31 DIAGNOSIS — F411 Generalized anxiety disorder: Secondary | ICD-10-CM

## 2013-07-31 DIAGNOSIS — F419 Anxiety disorder, unspecified: Secondary | ICD-10-CM

## 2013-07-31 MED ORDER — ESTAZOLAM 2 MG PO TABS: ORAL_TABLET | ORAL | Status: DC

## 2013-07-31 NOTE — Telephone Encounter (Signed)
Rx called to pharmacy voicemail. 

## 2013-07-31 NOTE — Assessment & Plan Note (Signed)
Stable on prosom, continue same.

## 2013-07-31 NOTE — Assessment & Plan Note (Signed)
Repeat MRA to follow up.

## 2013-07-31 NOTE — Assessment & Plan Note (Signed)
Stable, moitor.

## 2013-07-31 NOTE — Telephone Encounter (Signed)
Johnny [name] does not have HTN.  He is cardizem due to atrial fibrillation.  Please notify [place] [facility].  If they require BMET due to age, it can be ordered due to need for contrast but he does not have HTN.

## 2013-07-31 NOTE — Patient Instructions (Addendum)
You will be contacted about your MRI. Follow up in 6 months, sooner if problems/concerns.

## 2013-07-31 NOTE — Assessment & Plan Note (Signed)
Rate controlled. Advised pt to take aspirin regularly.

## 2013-07-31 NOTE — Telephone Encounter (Signed)
Notified scheduler and to make sure MRI gets scheduled at [facility] imaging on [address] per pt's request.

## 2013-07-31 NOTE — Progress Notes (Signed)
Subjective:    Patient ID: Johnny [name], male    DOB: 1956/02/12, 58 y.o.   MRN: [id number]  HPI  Johnny [name] is a 58 yr old male who presents today for follow up of multiple medical problems:  Anxiety/insomnia- has trouble sleeping with certain stressors.  Reports that does have good response to the prosom.    Thoracic aortic aneurysm- 5 cm 5/14.  It was recommended that he have a 6 month follow up MRA.  Reports that he would like to proceede with follow up MRA.  A Fib- Asymptomatic.  Not taking aspirin regularly.    Review of Systems    see HPI  Past Medical History  Diagnosis Date  . Mitral regurgitation   . Chronic insomnia   . Atrial fibrillation   . History of rheumatic fever     age 57  . Patent foramen ovale   . Thoracic aortic aneurysm     History   Social History  . Marital Status: Married    Spouse Name: N/A    Number of Children: N/A  . Years of Education: N/A   Occupational History  . Not on file.   Social History Main Topics  . Smoking status: Never Smoker   . Smokeless tobacco: Never Used  . Alcohol Use: No  . Drug Use: No  . Sexual Activity: Not on file   Other Topics Concern  . Not on file   Social History Narrative   Manages [employer]   Married   2 daughters ages 54, 63          Past Surgical History  Procedure Laterality Date  . Cardioversion      Family History  Problem Relation Age of Onset  . Pulmonary fibrosis Mother   . Diverticulosis Father     Allergies  Allergen Reactions  . Antihistamines, Chlorpheniramine-Type Other (See Comments)    Acts as a depressant with pt.    Current Outpatient Prescriptions on File Prior to Visit  Medication Sig Dispense Refill  . diltiazem (CARDIZEM CD) 120 MG 24 hr capsule TAKE ONE CAPSULE BY MOUTH ONE TIME DAILY  30 capsule  6  . estazolam (PROSOM) 2 MG tablet TAKE 1 TABLET BY MOUTH AT BEDTIME  30 tablet  0  . Methylcobalamin POWD Inject 1 mL into the muscle 2 (two) times  a week.      . Multiple Vitamin (MULTIVITAMIN) tablet Take 1 tablet by mouth daily.        . [employer] POWD Compounded MMD (TROCHE) Methylcobalamin 5 mg/Methyfolate 5 mg/Vitamin D3 5,000IU take 1 Troche daily  1 g  prn   No current facility-administered medications on file prior to visit.    BP 116/80  Pulse 73  Temp(Src) 97.5 F (36.4 C) (Oral)  Resp 16  Wt 217 lb 1.9 oz (98.485 kg)  SpO2 99%    Objective:   Physical Exam  Constitutional: He is oriented to person, place, and time. He appears well-developed and well-nourished. No distress.  HENT:  Head: Normocephalic and atraumatic.  Cardiovascular: Normal rate and regular rhythm.   No murmur heard. Pulmonary/Chest: Effort normal and breath sounds normal. No respiratory distress. He has no wheezes. He has no rales. He exhibits no tenderness.  Neurological: He is alert and oriented to person, place, and time.  Psychiatric: He has a normal mood and affect. His behavior is normal. Judgment and thought content normal.          Assessment &  Plan:

## 2013-07-31 NOTE — Telephone Encounter (Signed)
I got Johnny [name] precerted for his Johnny MRA of the chest with contrast.  I scheduled it for tomorrow at 1 at the [facility].  They called back to say he has to have labs prior to the MRI as he has hypertension.  It is not listed on his problem list but when he had his last Johnny MRA it was listed on his imaging report.  He is taking Cardizem.  Johnny [name] wants you to ad the hypertension to his problem list and to enter the order for the bun and creatinine.  I have cancelled his procedure for tomorrow and will try and get him reschedule for another day next week.

## 2013-07-31 NOTE — Progress Notes (Signed)
Pre visit review using our clinic review tool, if applicable. No additional management support is needed unless otherwise documented below in the visit note. 

## 2013-07-31 NOTE — Assessment & Plan Note (Signed)
-  Stable, continue prosom.  

## 2013-08-01 ENCOUNTER — Ambulatory Visit (HOSPITAL_BASED_OUTPATIENT_CLINIC_OR_DEPARTMENT_OTHER): Payer: BC Managed Care – PPO

## 2013-08-06 ENCOUNTER — Inpatient Hospital Stay: Admission: RE | Admit: 2013-08-06 | Payer: BC Managed Care – PPO | Source: Ambulatory Visit

## 2013-08-22 ENCOUNTER — Ambulatory Visit
Admission: RE | Admit: 2013-08-22 | Discharge: 2013-08-22 | Disposition: A | Discharge status: Home / Self Care | Payer: No Typology Code available for payment source | Source: Ambulatory Visit | Attending: Family | Admitting: Family

## 2013-08-22 DIAGNOSIS — I714 Abdominal aortic aneurysm, without rupture: Secondary | ICD-10-CM

## 2013-08-22 MED ORDER — GADOBENATE DIMEGLUMINE 529 MG/ML IV SOLN
20.0000 mL | Freq: Once | INTRAVENOUS | Status: AC | PRN
  Administered 2013-08-22: 20 mL via INTRAVENOUS

## 2013-08-31 ENCOUNTER — Other Ambulatory Visit: Payer: Self-pay | Admitting: Family

## 2013-08-31 NOTE — Telephone Encounter (Signed)
Rx printed and was faxed to [phone].

## 2013-08-31 NOTE — Telephone Encounter (Signed)
Estazolam refilled per protocol. JG//CMA

## 2013-11-27 ENCOUNTER — Other Ambulatory Visit: Payer: Self-pay | Admitting: Cardiology

## 2013-12-30 ENCOUNTER — Other Ambulatory Visit: Payer: Self-pay | Admitting: Family

## 2013-12-30 NOTE — Telephone Encounter (Signed)
Ok to send 30 tabs with zero refills.  

## 2013-12-30 NOTE — Telephone Encounter (Signed)
Pt is due for follow up in May.  Please advise.  Medication name:  Name from pharmacy:  estazolam (PROSOM) 2 MG tablet  ESTAZOLAM 2MG  TABLETS Sig: TAKE 1 TABLET BY MOUTH DAILY AT BEDTIME. Dispense: 30 tablet Refills: 0 Start: 12/30/2013 Class: Normal Requested on: 12/30/2013 Originally ordered on: 02/27/2011 Last refill: 11/27/2013

## 2013-12-31 ENCOUNTER — Other Ambulatory Visit: Payer: Self-pay | Admitting: Cardiology

## 2014-01-01 NOTE — Telephone Encounter (Signed)
Rx called to pharmacy voicemail as below. 

## 2014-01-29 ENCOUNTER — Other Ambulatory Visit: Payer: Self-pay | Admitting: Cardiology

## 2014-01-29 ENCOUNTER — Other Ambulatory Visit: Payer: Self-pay | Admitting: Family

## 2014-01-29 NOTE — Telephone Encounter (Signed)
Medication name:  Name from pharmacy:  estazolam (PROSOM) 2 MG tablet  ESTAZOLAM 2MG  TABLETS Sig: TAKE 1 TABLET BY MOUTH AT BEDTIME Dispense: 30 tablet Refills: 0 Start: 01/29/2014 Class: Normal Requested on: 01/29/2014 Originally ordered on: 02/27/2011 Last refill: 01/01/2014

## 2014-01-31 NOTE — Telephone Encounter (Signed)
OK to send 30 tabs zero refills.  

## 2014-02-01 NOTE — Telephone Encounter (Signed)
Rx called to pharmacy voicemail. 

## 2014-02-28 ENCOUNTER — Other Ambulatory Visit: Payer: Self-pay | Admitting: Cardiology

## 2014-03-06 ENCOUNTER — Other Ambulatory Visit: Payer: Self-pay | Admitting: Family

## 2014-03-08 NOTE — Telephone Encounter (Signed)
Ok to send 30 tabs no refills. Should be seen for follow up please.

## 2014-03-08 NOTE — Telephone Encounter (Signed)
Refill request for estazlopam Last filled by MD on - 02/01/2014 #30 x0 Last Appt: 07/31/2013 Next Appt: none Please advise refill?

## 2014-03-12 NOTE — Telephone Encounter (Signed)
Left message informing patient that rx has been faxed.

## 2014-03-12 NOTE — Telephone Encounter (Signed)
Rx faxed

## 2014-03-12 NOTE — Telephone Encounter (Signed)
Patient is calling regarding this. He states that he needs this completed by later this morning. He is going out of town tomorrow. I also informed patient that he needs to be seen for additional refills and he states that he will call to schedule appointment when he is back in town.

## 2014-04-06 ENCOUNTER — Telehealth: Payer: Self-pay | Admitting: *Deleted

## 2014-04-06 DIAGNOSIS — I712 Thoracic aortic aneurysm, without rupture, unspecified: Secondary | ICD-10-CM

## 2014-04-06 NOTE — Telephone Encounter (Signed)
Left message for pt to call  Time to schedule CTA of chest to follow up thoracic aneurysm

## 2014-04-07 NOTE — Telephone Encounter (Signed)
Left message for pt, order placed for him to have the MRA at the [address] location. number left so he can call and schedule the MRA at his convenience. Also told the pt to call and schedule follow up with dr Jens Somcrenshaw after MRA is complete. Pt does not require lab work prior to imaging. Pt requested I leave this information on his voicemail.

## 2014-04-07 NOTE — Telephone Encounter (Signed)
Returning your call. °

## 2014-04-14 ENCOUNTER — Telehealth: Payer: Self-pay | Admitting: Cardiology

## 2014-04-14 NOTE — Telephone Encounter (Signed)
Left message with appointment information for MRI / MRA of abdomen---scheduled for Saturday 04/24/14 at 1:00pm---arrival time 12:30pm---no restrictions.  Please have lab work done no later than Thursday 04/22/14.

## 2014-04-24 ENCOUNTER — Ambulatory Visit
Admission: RE | Admit: 2014-04-24 | Discharge: 2014-04-24 | Disposition: A | Discharge status: Home / Self Care | Payer: BC Managed Care – PPO | Source: Ambulatory Visit | Attending: Cardiology | Admitting: Cardiology

## 2014-04-24 DIAGNOSIS — I712 Thoracic aortic aneurysm, without rupture, unspecified: Secondary | ICD-10-CM

## 2014-04-24 MED ORDER — GADOBENATE DIMEGLUMINE 529 MG/ML IV SOLN
20.0000 mL | Freq: Once | INTRAVENOUS | Status: AC | PRN
  Administered 2014-04-24: 20 mL via INTRAVENOUS

## 2014-04-27 ENCOUNTER — Encounter: Payer: Self-pay | Admitting: Cardiology

## 2014-05-05 ENCOUNTER — Encounter: Payer: Self-pay | Admitting: *Deleted

## 2014-05-05 ENCOUNTER — Encounter: Payer: Self-pay | Admitting: Cardiology

## 2014-05-05 ENCOUNTER — Ambulatory Visit (INDEPENDENT_AMBULATORY_CARE_PROVIDER_SITE_OTHER): Payer: BC Managed Care – PPO | Admitting: Cardiology

## 2014-05-05 VITALS — BP 124/70 | HR 81 | Ht 74.0 in | Wt 217.8 lb

## 2014-05-05 DIAGNOSIS — I059 Rheumatic mitral valve disease, unspecified: Secondary | ICD-10-CM

## 2014-05-05 DIAGNOSIS — I4891 Unspecified atrial fibrillation: Secondary | ICD-10-CM

## 2014-05-05 DIAGNOSIS — I080 Rheumatic disorders of both mitral and aortic valves: Secondary | ICD-10-CM

## 2014-05-05 DIAGNOSIS — I712 Thoracic aortic aneurysm, without rupture, unspecified: Secondary | ICD-10-CM

## 2014-05-05 DIAGNOSIS — R0989 Other specified symptoms and signs involving the circulatory and respiratory systems: Secondary | ICD-10-CM

## 2014-05-05 NOTE — Assessment & Plan Note (Signed)
Plan follow-up echocardiogram. 

## 2014-05-05 NOTE — Assessment & Plan Note (Signed)
Most recent MRA in August shows thoracic aortic aneurysm of 4.9 cm. It has not changed in one year. Plan repeat study in August of 2016. No history of aortic dissection in his family. No history of aneurysm in his family. We will plan an abdominal ultrasound to exclude aneurysm given bruit.

## 2014-05-05 NOTE — Progress Notes (Signed)
      HPI: FU atrial fibrillation, thoracic aortic aneurysm and mitral regurgitation. He had a TEE-guided cardioversion on July 13, 2008. His TEE showed normal LV function. He had prolapse of the posterior mitral valve leaflet with moderate mitral regurgitation (2+). He also had a patent foramen ovale. We subsequently proceeded with cardioversion successfully to sinus rhythm. Also note, a TSH was within normal limits of 1.237. Echocardiogram in January of 2011 revealed normal LV function, dilated aortic root, mitral valve prolapse with moderate mitral regurgitation and mild left atrial enlargement. Stress echocardiogram in January of 2011 was normal. Note we recommended followup echocardiogram for MR. He did not followup with this study. Last MRA of his thoracic aorta in August of 2015 showed stable fusiform dilatation of the aortic root at 4.9 cm. Since he was last seen, the patient denies any dyspnea on exertion, orthopnea, PND, pedal edema, palpitations, syncope or chest pain.   Current Outpatient Prescriptions  Medication Sig Dispense Refill  . aspirin EC 81 MG tablet Take 81 mg by mouth daily.      Marland Kitchen. CARTIA XT 120 MG 24 hr capsule TAKE ONE CAPSULE BY MOUTH EVERY DAY  30 capsule  2  . estazolam (PROSOM) 2 MG tablet TAKE 1 TABLET BY MOUTH AT BEDTIME  30 tablet  0  . Methylcobalamin POWD Inject 1 mL into the muscle 2 (two) times a week.      . Multiple Vitamin (MULTIVITAMIN) tablet Take 1 tablet by mouth daily.        . [employer] POWD Compounded MMD (TROCHE) Methylcobalamin 5 mg/Methyfolate 5 mg/Vitamin D3 5,000IU take 1 Troche daily  1 g  prn   No current facility-administered medications for this visit.     Past Medical History  Diagnosis Date  . Mitral regurgitation   . Chronic insomnia   . Atrial fibrillation   . History of rheumatic fever     age 58  . Patent foramen ovale   . Thoracic aortic aneurysm     Past Surgical History  Procedure Laterality Date  . Cardioversion        History   Social History  . Marital Status: Married    Spouse Name: N/A    Number of Children: N/A  . Years of Education: N/A   Occupational History  . Not on file.   Social History Main Topics  . Smoking status: Never Smoker   . Smokeless tobacco: Never Used  . Alcohol Use: No  . Drug Use: No  . Sexual Activity: Not on file   Other Topics Concern  . Not on file   Social History Narrative   [employer]ages [employer]   Married   2 daughters ages 2027, 20          ROS: no fevers or chills, productive cough, hemoptysis, dysphasia, odynophagia, melena, hematochezia, dysuria, hematuria, rash, seizure activity, orthopnea, PND, pedal edema, claudication. Remaining systems are negative.  Physical Exam: Well-developed well-nourished in no acute distress.  Skin is warm and dry.  HEENT is normal.  Neck is supple.  Chest is clear to auscultation with normal expansion.  Cardiovascular exam is regular rate and rhythm. 2/6 systolic murmur apex. Abdominal exam nontender or distended. No masses palpated. Positive bruit Extremities show no edema. neuro grossly intact  ECG Sinus rhythm, nonspecific ST changes.

## 2014-05-05 NOTE — Patient Instructions (Signed)
Your physician wants you to follow-up in: ONE YEAR WITH DR CRENSHAW You will receive a reminder letter in the mail two months in advance. If you don't receive a letter, please call our office to schedule the follow-up appointment.   Your physician has requested that you have an echocardiogram. Echocardiography is a painless test that uses sound waves to create images of your heart. It provides your doctor with information about the size and shape of your heart and how well your heart's chambers and valves are working. This procedure takes approximately one hour. There are no restrictions for this procedure.   Your physician has requested that you have an abdominal aorta duplex. During this test, an ultrasound is used to evaluate the aorta. Allow 30 minutes for this exam. Do not eat after midnight the day before and avoid carbonated beverages  

## 2014-05-05 NOTE — Assessment & Plan Note (Signed)
Patient remains in sinus rhythm. Continue Cardizem. Continue aspirin. No embolic risk factors.

## 2014-06-11 ENCOUNTER — Other Ambulatory Visit: Payer: Self-pay | Admitting: Cardiology

## 2014-08-03 ENCOUNTER — Ambulatory Visit (INDEPENDENT_AMBULATORY_CARE_PROVIDER_SITE_OTHER): Payer: BC Managed Care – PPO | Admitting: Physician Assistant

## 2014-08-03 ENCOUNTER — Encounter: Payer: Self-pay | Admitting: Physician Assistant

## 2014-08-03 VITALS — BP 116/69 | HR 89 | Temp 98.3°F | Resp 16 | Ht 74.0 in | Wt 225.5 lb

## 2014-08-03 DIAGNOSIS — G4700 Insomnia, unspecified: Secondary | ICD-10-CM

## 2014-08-03 MED ORDER — ALPRAZOLAM 0.5 MG PO TABS: 0.5000 mg | ORAL_TABLET | Freq: Every day | ORAL | Status: DC

## 2014-08-03 NOTE — Patient Instructions (Signed)
Please take medication as directed at bedtime only if needed to help with sleep.  Follow-up with your Primary Care Provider in 1 month [facility](Melissa Peggyann Juba'Sullivan).     Insomnia Insomnia is frequent trouble falling and/or staying asleep. Insomnia can be a long term problem or a short term problem. Both are common. Insomnia can be a short term problem when the wakefulness is related to a certain stress or worry. Long term insomnia is often related to ongoing stress during waking hours and/or poor sleeping habits. Overtime, sleep deprivation itself can make the problem worse. Every little thing feels more severe because you are overtired and your ability to cope is decreased. CAUSES   Stress, anxiety, and depression.  Poor sleeping habits.  Distractions such as TV in the bedroom.  Naps close to bedtime.  Engaging in emotionally charged conversations before bed.  Technical reading before sleep.  Alcohol and other sedatives. They may make the problem worse. They can hurt normal sleep patterns and normal dream activity.  Stimulants such as caffeine for several hours prior to bedtime.  Pain syndromes and shortness of breath can cause insomnia.  Exercise late at night.  Changing time zones may cause sleeping problems (jet lag). It is sometimes helpful to have someone observe your sleeping patterns. They should look for periods of not breathing during the night (sleep apnea). They should also look to see how long those periods last. If you live alone or observers are uncertain, you can also be observed at a sleep clinic where your sleep patterns will be professionally monitored. Sleep apnea requires a checkup and treatment. Give your caregivers your medical history. Give your caregivers observations your family has made about your sleep.  SYMPTOMS   Not feeling rested in the morning.  Anxiety and restlessness at bedtime.  Difficulty falling and staying asleep. TREATMENT   Your caregiver may  prescribe treatment for an underlying medical disorders. Your caregiver can give advice or help if you are using alcohol or other drugs for self-medication. Treatment of underlying problems will usually eliminate insomnia problems.  Medications can be prescribed for short time use. They are generally not recommended for lengthy use.  Over-the-counter sleep medicines are not recommended for lengthy use. They can be habit forming.  You can promote easier sleeping by making lifestyle changes such as:  Using relaxation techniques that help with breathing and reduce muscle tension.  Exercising earlier in the day.  Changing your diet and the time of your last meal. No night time snacks.  Establish a regular time to go to bed.  Counseling can help with stressful problems and worry.  Soothing music and white noise may be helpful if there are background noises you cannot remove.  Stop tedious detailed work at least one hour before bedtime. HOME CARE INSTRUCTIONS   Keep a diary. Inform your caregiver about your progress. This includes any medication side effects. See your caregiver regularly. Take note of:  Times when you are asleep.  Times when you are awake during the night.  The quality of your sleep.  How you feel the next day. This information will help your caregiver care for you.  Get out of bed if you are still awake after 15 minutes. Read or do some quiet activity. Keep the lights down. Wait until you feel sleepy and go back to bed.  Keep regular sleeping and waking hours. Avoid naps.  Exercise regularly.  Avoid distractions at bedtime. Distractions include watching television or engaging in any intense  or detailed activity like attempting to balance the household checkbook.  Develop a bedtime ritual. Keep a familiar routine of bathing, brushing your teeth, climbing into bed at the same time each night, listening to soothing music. Routines increase the success of falling to  sleep faster.  Use relaxation techniques. This can be using breathing and muscle tension release routines. It can also include visualizing peaceful scenes. You can also help control troubling or intruding thoughts by keeping your mind occupied with boring or repetitive thoughts like the old concept of counting sheep. You can make it more creative like imagining planting one beautiful flower after another in your backyard garden.  During your day, work to eliminate stress. When this is not possible use some of the previous suggestions to help reduce the anxiety that accompanies stressful situations. MAKE SURE YOU:   Understand these instructions.  Will watch your condition.  Will get help right away if you are not doing well or get worse. Document Released: 08/31/2000 Document Revised: 11/26/2011 Document Reviewed: 10/01/2007 [facility] Patient Information 2015 [place], [place]. This information is not intended to replace advice given to you by your health care provider. Make sure you discuss any questions you have with your health care provider.

## 2014-08-03 NOTE — Assessment & Plan Note (Signed)
Patient refusing medications like Bellsomra or Lunesta giving history of ineffectiveness of Ambien.  Previous success with short-term use of Estazolam.   Will begin one time Rx for xanax to use sparingly.  Patient does not belong to me and will need to follow-up with PCP.

## 2014-08-03 NOTE — Progress Notes (Signed)
History of Present Illness: Johnny [name] is a 58 y.o. male who present to the clinic today c/o chronic insomnia.  Was previously on Estazolam prescribed by Dr. Artist PaisYoo with good relief.  Has weaned off his medication several months ago.   Has never tried over-the-counter-measures.  Has taken Melatonin and Ambien in the past with little relief.  Is allergic to medications like Sominex.  History: Past Medical History  Diagnosis Date  . Mitral regurgitation   . Chronic insomnia   . Atrial fibrillation   . History of rheumatic fever     age 56  . Patent foramen ovale   . Thoracic aortic aneurysm    Current outpatient prescriptions: aspirin EC 81 MG tablet, Take 81 mg by mouth daily., Disp: , Rfl: ;  CARTIA XT 120 MG 24 hr capsule, TAKE 1 CAPSULE BY MOUTH EVERY DAY, Disp: 30 capsule, Rfl: 12;  Methylcobalamin POWD, Inject 1 mL into the muscle 2 (two) times a week., Disp: , Rfl: ;  Multiple Vitamin (MULTIVITAMIN) tablet, Take 1 tablet by mouth daily.  , Disp: , Rfl:  ALPRAZolam (XANAX) 0.5 MG tablet, Take 1 tablet (0.5 mg total) by mouth at bedtime., Disp: 30 tablet, Rfl: 0 Allergies  Allergen Reactions  . Antihistamines, Chlorpheniramine-Type Other (See Comments)    Acts as a depressant with pt.   Family History  Problem Relation Age of Onset  . Pulmonary fibrosis Mother   . Diverticulosis Father    History   Social History  . Marital Status: Married    Spouse Name: N/A    Number of Children: N/A  . Years of Education: N/A   Social History Main Topics  . Smoking status: Never Smoker   . Smokeless tobacco: Never Used  . Alcohol Use: No  . Drug Use: No  . Sexual Activity: None   Other Topics Concern  . None   Social History Narrative   [employer]   Married   2 daughters ages 4827, 8020         Review of Systems: See HPI.  All other ROS are negative.  Physical Examination: BP 116/69 mmHg  Pulse 89  Temp(Src) 98.3 F (36.8 C) (Oral)  Resp 16  Ht 6\' 2"   (1.88 m)  Wt 225 lb 8 oz (102.286 kg)  BMI 28.94 kg/m2  SpO2 97%  Physical Exam  Constitutional: He is oriented to person, place, and time and well-developed, well-nourished, and in no distress.  HENT:  Head: Normocephalic and atraumatic.  Right Ear: External ear normal.  Left Ear: External ear normal.  Nose: Nose normal.  Mouth/Throat: Oropharynx is clear and moist. No oropharyngeal exudate.  TM within normal limits bilaterally.  Eyes: Conjunctivae are normal. Pupils are equal, round, and reactive to light.  Neck: Neck supple.  Cardiovascular: Normal rate, regular rhythm, normal heart sounds and intact distal pulses.   Pulmonary/Chest: Effort normal and breath sounds normal. No respiratory distress. He has no wheezes. He has no rales. He exhibits no tenderness.  Lymphadenopathy:    He has no cervical adenopathy.  Neurological: He is oriented to person, place, and time.  Skin: Skin is warm and dry. No rash noted.  Psychiatric: Affect normal.  Vitals reviewed.  Assessment/Plan: Insomnia Patient refusing medications like Bellsomra or Lunesta giving history of ineffectiveness of Ambien.  Previous success with short-term use of Estazolam.   Will begin one time Rx for xanax to use sparingly.  Patient does not belong to me and will  need to follow-up with PCP.

## 2014-08-03 NOTE — Progress Notes (Signed)
Pre visit review using our clinic review tool, if applicable. No additional management support is needed unless otherwise documented below in the visit note/SLS  

## 2015-04-28 ENCOUNTER — Telehealth: Payer: Self-pay | Admitting: *Deleted

## 2015-04-28 DIAGNOSIS — I712 Thoracic aortic aneurysm, without rupture, unspecified: Secondary | ICD-10-CM

## 2015-04-28 NOTE — Telephone Encounter (Signed)
Pt needs to have MRA of chest for one year follow up of thoracic aneurysm and follow up with dr Jens Som Left message for pt to call to discuss

## 2015-05-10 NOTE — Telephone Encounter (Signed)
Spoke with pt, he will have lab work at the [facility] in [place]. Order faxed for bmp prior to MRA to [phone].

## 2015-05-12 LAB — BASIC METABOLIC PANEL
BUN / CREAT RATIO: 11 (ref 10–22)
BUN: 11 mg/dL (ref 8–27)
CO2: 23 mmol/L (ref 18–29)
CREATININE: 0.97 mg/dL (ref 0.76–1.27)
Calcium: 9.7 mg/dL (ref 8.6–10.2)
Chloride: 104 mmol/L (ref 97–108)
GFR calc non Af Amer: 84 mL/min/{1.73_m2} (ref >59)
GFR, EST AFRICAN AMERICAN: 97 mL/min/{1.73_m2} (ref >59)
Glucose: 104 mg/dL — ABNORMAL HIGH (ref 65–99)
Potassium: 4.6 mmol/L (ref 3.5–5.2)
Sodium: 141 mmol/L (ref 134–144)

## 2015-05-14 ENCOUNTER — Ambulatory Visit
Admission: RE | Admit: 2015-05-14 | Discharge: 2015-05-14 | Disposition: A | Discharge status: Home / Self Care | Payer: BLUE CROSS/BLUE SHIELD | Source: Ambulatory Visit | Attending: Cardiology | Admitting: Cardiology

## 2015-05-14 DIAGNOSIS — I712 Thoracic aortic aneurysm, without rupture, unspecified: Secondary | ICD-10-CM

## 2015-05-14 MED ORDER — GADOBENATE DIMEGLUMINE 529 MG/ML IV SOLN
20.0000 mL | Freq: Once | INTRAVENOUS | Status: AC | PRN
Start: 2015-05-14 — End: 2015-05-14
  Administered 2015-05-14: 20 mL via INTRAVENOUS

## 2015-07-07 ENCOUNTER — Other Ambulatory Visit: Payer: Self-pay | Admitting: Cardiology

## 2015-07-07 MED ORDER — DILTIAZEM HCL ER COATED BEADS 120 MG PO CP24: ORAL_CAPSULE | ORAL | Status: DC

## 2015-08-06 ENCOUNTER — Other Ambulatory Visit: Payer: Self-pay | Admitting: Cardiology

## 2015-09-28 ENCOUNTER — Ambulatory Visit (INDEPENDENT_AMBULATORY_CARE_PROVIDER_SITE_OTHER): Payer: BLUE CROSS/BLUE SHIELD | Admitting: Cardiology

## 2015-09-28 ENCOUNTER — Encounter: Payer: Self-pay | Admitting: Cardiology

## 2015-09-28 VITALS — BP 118/81 | HR 68 | Ht 74.0 in | Wt 227.0 lb

## 2015-09-28 DIAGNOSIS — I712 Thoracic aortic aneurysm, without rupture, unspecified: Secondary | ICD-10-CM

## 2015-09-28 DIAGNOSIS — I4891 Unspecified atrial fibrillation: Secondary | ICD-10-CM

## 2015-09-28 DIAGNOSIS — I059 Rheumatic mitral valve disease, unspecified: Secondary | ICD-10-CM

## 2015-09-28 DIAGNOSIS — R0989 Other specified symptoms and signs involving the circulatory and respiratory systems: Secondary | ICD-10-CM | POA: Diagnosis not present

## 2015-09-28 DIAGNOSIS — I480 Paroxysmal atrial fibrillation: Secondary | ICD-10-CM | POA: Diagnosis not present

## 2015-09-28 MED ORDER — DILTIAZEM HCL ER COATED BEADS 120 MG PO CP24: ORAL_CAPSULE | ORAL | Status: DC

## 2015-09-28 NOTE — Patient Instructions (Signed)
Medication Instructions:   NO CHANGE  Testing/Procedures:  Your physician has requested that you have an echocardiogram. Echocardiography is a painless test that uses sound waves to create images of your heart. It provides your doctor with information about the size and shape of your heart and how well your heart's chambers and valves are working. This procedure takes approximately one hour. There are no restrictions for this procedure.   Your physician has requested that you have an abdominal aorta duplex. During this test, an ultrasound is used to evaluate the aorta. Allow 30 minutes for this exam. Do not eat after midnight the day before and avoid carbonated beverages   Follow-Up:  Your physician wants you to follow-up in: ONE YEAR WITH DR CRENSHAW You will receive a reminder letter in the mail two months in advance. If you don't receive a letter, please call our office to schedule the follow-up appointment.   If you need a refill on your cardiac medications before your next appointment, please call your pharmacy.  

## 2015-09-28 NOTE — Assessment & Plan Note (Signed)
We will again try and scheduleAn echocardiogram to reassess mitral regurgitation.

## 2015-09-28 NOTE — Progress Notes (Signed)
      HPI: FU atrial fibrillation, thoracic aortic aneurysm and mitral regurgitation. He had a TEE-guided cardioversion on July 13, 2008. His TEE showed normal LV function. He had prolapse of the posterior mitral valve leaflet with moderate mitral regurgitation (2+). He also had a patent foramen ovale. We subsequently proceeded with cardioversion successfully to sinus rhythm. Also note, a TSH was within normal limits of 1.237. Echocardiogram in January of 2011 revealed normal LV function, dilated aortic root, mitral valve prolapse with moderate mitral regurgitation and mild left atrial enlargement. Stress echocardiogram in January of 2011 was normal. Note we recommended followup echocardiogram for MR. He has not followed up for these studies. Abdominal ultrasound also ordered at last office visit but not performed. Last MRA of his thoracic aorta in August of 2016 showed fusiform dilatation of the aortic root measuring 50 mm. Since he was last seen, the patient denies any dyspnea on exertion, orthopnea, PND, pedal edema, palpitations, syncope or chest pain.   Current Outpatient Prescriptions  Medication Sig Dispense Refill  . CARTIA XT 120 MG 24 hr capsule TAKE 1 CAPSULE BY MOUTH EVERY DAY 30 capsule 1  . Methylcobalamin POWD Inject 1 mL into the muscle 2 (two) times a week. Reported on 09/28/2015    . Multiple Vitamin (MULTIVITAMIN) tablet Take 1 tablet by mouth daily.       No current facility-administered medications for this visit.     Past Medical History  Diagnosis Date  . Mitral regurgitation   . Chronic insomnia   . Atrial fibrillation (HCC)   . History of rheumatic fever     age 60  . Patent foramen ovale   . Thoracic aortic aneurysm [facility](HCC)     Past Surgical History  Procedure Laterality Date  . Cardioversion      Social History   Social History  . Marital Status: Married    Spouse Name: N/A  . Number of Children: N/A  . Years of Education: N/A   Occupational History    . Not on file.   Social History Main Topics  . Smoking status: Never Smoker   . Smokeless tobacco: Never Used  . Alcohol Use: No  . Drug Use: No  . Sexual Activity: Not on file   Other Topics Concern  . Not on file   Social History Narrative   [employer]ages [employer]   Married   2 daughters ages 6327, 5020          Family History  Problem Relation Age of Onset  . Pulmonary fibrosis Mother   . Diverticulosis Father     ROS: no fevers or chills, productive cough, hemoptysis, dysphasia, odynophagia, melena, hematochezia, dysuria, hematuria, rash, seizure activity, orthopnea, PND, pedal edema, claudication. Remaining systems are negative.  Physical Exam: Well-developed well-nourished in no acute distress.  Skin is warm and dry.  HEENT is normal.  Neck is supple.  Chest is clear to auscultation with normal expansion.  Cardiovascular exam is regular rate and rhythm. 2/6 systolic murmur apex Abdominal exam nontender or distended. No masses palpated. Extremities show no edema. neuro grossly intact  ECG Sinus rhythm at a rate of 69. No ST changes.

## 2015-09-28 NOTE — Assessment & Plan Note (Signed)
Most recent MRA in August shows thoracic aortic aneurysm of 5 cm. It has not changed dramatically in one year. Plan repeat study in August of 2017. No history of aortic dissection in his family. No history of aneurysm in his family. We will plan an abdominal ultrasound to exclude aneurysm given bruit.

## 2015-09-28 NOTE — Assessment & Plan Note (Signed)
Patient remains in sinus rhythm. Continue Cardizem.No embolic risk factors. CHADSvasc 0.

## 2015-10-12 ENCOUNTER — Ambulatory Visit (HOSPITAL_BASED_OUTPATIENT_CLINIC_OR_DEPARTMENT_OTHER)
Admission: RE | Admit: 2015-10-12 | Discharge: 2015-10-12 | Disposition: A | Discharge status: Home / Self Care | Payer: BLUE CROSS/BLUE SHIELD | Source: Ambulatory Visit | Attending: Cardiology | Admitting: Cardiology

## 2015-10-12 ENCOUNTER — Other Ambulatory Visit: Payer: Self-pay | Admitting: Cardiology

## 2015-10-12 DIAGNOSIS — I77810 Thoracic aortic ectasia: Secondary | ICD-10-CM | POA: Diagnosis not present

## 2015-10-12 DIAGNOSIS — I059 Rheumatic mitral valve disease, unspecified: Secondary | ICD-10-CM | POA: Insufficient documentation

## 2015-10-12 DIAGNOSIS — I340 Nonrheumatic mitral (valve) insufficiency: Secondary | ICD-10-CM | POA: Insufficient documentation

## 2015-10-12 DIAGNOSIS — I341 Nonrheumatic mitral (valve) prolapse: Secondary | ICD-10-CM | POA: Diagnosis not present

## 2015-10-12 DIAGNOSIS — R0989 Other specified symptoms and signs involving the circulatory and respiratory systems: Secondary | ICD-10-CM

## 2015-10-12 DIAGNOSIS — I517 Cardiomegaly: Secondary | ICD-10-CM | POA: Diagnosis not present

## 2015-10-12 DIAGNOSIS — I5189 Other ill-defined heart diseases: Secondary | ICD-10-CM | POA: Insufficient documentation

## 2015-10-12 DIAGNOSIS — I253 Aneurysm of heart: Secondary | ICD-10-CM | POA: Insufficient documentation

## 2015-10-12 NOTE — Progress Notes (Signed)
  Echocardiogram 2D Echocardiogram has been performed.  Johnny [name] 10/12/2015, 11:32 AM

## 2015-11-22 ENCOUNTER — Telehealth: Payer: Self-pay | Admitting: Family

## 2015-11-22 NOTE — Telephone Encounter (Signed)
LVM inquiring if patient received flu shot  °

## 2016-05-07 ENCOUNTER — Telehealth: Payer: Self-pay | Admitting: *Deleted

## 2016-05-07 DIAGNOSIS — I712 Thoracic aortic aneurysm, without rupture, unspecified: Secondary | ICD-10-CM

## 2016-05-07 NOTE — Telephone Encounter (Signed)
Pt needs MRA of the chest to follow up on his thoracic aneurysm. He will need a BMP for that scan. Left message for pt to call

## 2016-05-14 ENCOUNTER — Ambulatory Visit (HOSPITAL_COMMUNITY): Admission: RE | Admit: 2016-05-14 | Payer: BLUE CROSS/BLUE SHIELD | Source: Ambulatory Visit

## 2016-06-05 NOTE — Telephone Encounter (Signed)
Left message for pt to call to discuss.

## 2016-06-20 ENCOUNTER — Encounter: Payer: Self-pay | Admitting: *Deleted

## 2016-06-20 NOTE — Telephone Encounter (Signed)
Letter mailed to patient asking him to call and schedule

## 2016-10-10 ENCOUNTER — Other Ambulatory Visit: Payer: Self-pay | Admitting: *Deleted

## 2016-10-10 DIAGNOSIS — I712 Thoracic aortic aneurysm, without rupture, unspecified: Secondary | ICD-10-CM

## 2016-10-10 DIAGNOSIS — I714 Abdominal aortic aneurysm, without rupture, unspecified: Secondary | ICD-10-CM

## 2016-10-25 ENCOUNTER — Telehealth: Payer: Self-pay | Admitting: Cardiology

## 2016-10-25 NOTE — Telephone Encounter (Signed)
[facility] they called patient on 10/12/2016 to schedule MR Chest/ABD.  Unable to reach due to home phone was disconnected.  I called patient cell phone and told him that GI was trying to call him to schedule the tests.   I called GI and to schedule the test and to let them know his number had change.  They are going to call him to schedule the test.   Patient information has been updated in the system.

## 2016-11-10 ENCOUNTER — Ambulatory Visit
Admission: RE | Admit: 2016-11-10 | Discharge: 2016-11-10 | Disposition: A | Discharge status: Home / Self Care | Payer: 59 | Source: Ambulatory Visit | Attending: Cardiology | Admitting: Cardiology

## 2016-11-10 ENCOUNTER — Other Ambulatory Visit: Payer: Self-pay | Admitting: Cardiology

## 2016-11-10 DIAGNOSIS — I712 Thoracic aortic aneurysm, without rupture, unspecified: Secondary | ICD-10-CM

## 2016-11-10 DIAGNOSIS — I714 Abdominal aortic aneurysm, without rupture, unspecified: Secondary | ICD-10-CM

## 2016-11-11 ENCOUNTER — Other Ambulatory Visit: Payer: Self-pay | Admitting: Cardiology

## 2016-11-12 NOTE — Telephone Encounter (Signed)
Rx(s) sent to pharmacy electronically.  

## 2016-12-13 ENCOUNTER — Encounter: Payer: Self-pay | Admitting: Cardiology

## 2016-12-18 NOTE — Progress Notes (Signed)
      HPI: FU atrial fibrillation, thoracic aortic aneurysm and mitral regurgitation. He had a TEE-guided cardioversion on July 13, 2008. His TEE showed normal LV function. He had prolapse of the posterior mitral valve leaflet with moderate mitral regurgitation (2+). He also had a patent foramen ovale. Stress echocardiogram in January of 2011 was normal. Last echocardiogram January 2017 showed normal LV function, moderate left ventricular hypertrophy, dilated aortic root, bileaflet mitral valve prolapse with moderate mitral regurgitation. Last MRA February 2018 showed aortic root measuring 4.9 cm at the sinuses of Valsalva unchanged. Abdominal ultrasound January 2017 showed abdominal aortic aneurysm measuring 3.3 cm. Since he was last seen, the patient denies any dyspnea on exertion, orthopnea, PND, pedal edema, palpitations, syncope or chest pain.   Current Outpatient Prescriptions  Medication Sig Dispense Refill  . diltiazem (CARTIA XT) 120 MG 24 hr capsule Take 1 capsule (120 mg total) by mouth daily. <PLEASE MAKE APPOINTMENT FOR REFILLS> 180 capsule 0  . Methylcobalamin POWD Inject 1 mL into the muscle 2 (two) times a week. Reported on 09/28/2015    . Multiple Vitamin (MULTIVITAMIN) tablet Take 1 tablet by mouth daily.       No current facility-administered medications for this visit.      Past Medical History:  Diagnosis Date  . Atrial fibrillation (HCC)   . Chronic insomnia   . History of rheumatic fever    age 61  . Mitral regurgitation   . Patent foramen ovale   . Thoracic aortic aneurysm [facility])     Past Surgical History:  Procedure Laterality Date  . CARDIOVERSION      Social History   Social History  . Marital status: Married    Spouse name: N/A  . Number of children: N/A  . Years of education: N/A   Occupational History  . Not on file.   Social History Main Topics  . Smoking status: Never Smoker  . Smokeless tobacco: Never Used  . Alcohol use No  . Drug use:  No  . Sexual activity: Not on file   Other Topics Concern  . Not on file   Social History Narrative   [employer]ages [employer]   Married   2 daughters ages 49, 58          Family History  Problem Relation Age of Onset  . Pulmonary fibrosis Mother   . Diverticulosis Father     ROS: no fevers or chills, productive cough, hemoptysis, dysphasia, odynophagia, melena, hematochezia, dysuria, hematuria, rash, seizure activity, orthopnea, PND, pedal edema, claudication. Remaining systems are negative.  Physical Exam: Well-developed well-nourished in no acute distress.  Skin is warm and dry.  HEENT is normal.  Neck is supple. No bruits Chest is clear to auscultation with normal expansion.  Cardiovascular exam is regular rate and rhythm.  Abdominal exam nontender or distended. No masses palpated. Extremities show no edema. neuro grossly intact  ECG- Normal sinus rhythm with no ST changes. personally reviewed  A/P  1 Thoracic aortic aneurysm-plan follow-up MRA February 2019.  2 Mitral valve prolapse with moderate mitral regurgitation-plan follow-up echocardiogram.  3 paroxysmal atrial fibrillation-patient remains in sinus rhythm. CHADSvasc 0. Continue ASA. Continue Cardizem for rate control if atrial fibrillation recurs.  4 abdominal aortic aneurysm-schedule follow-up ultrasound.  Olga Millers, MD

## 2016-12-26 ENCOUNTER — Ambulatory Visit (INDEPENDENT_AMBULATORY_CARE_PROVIDER_SITE_OTHER): Payer: 59 | Admitting: Cardiology

## 2016-12-26 ENCOUNTER — Encounter: Payer: Self-pay | Admitting: Cardiology

## 2016-12-26 VITALS — BP 111/74 | HR 80 | Ht 74.0 in | Wt 226.1 lb

## 2016-12-26 DIAGNOSIS — I712 Thoracic aortic aneurysm, without rupture, unspecified: Secondary | ICD-10-CM

## 2016-12-26 DIAGNOSIS — I059 Rheumatic mitral valve disease, unspecified: Secondary | ICD-10-CM | POA: Diagnosis not present

## 2016-12-26 DIAGNOSIS — I714 Abdominal aortic aneurysm, without rupture, unspecified: Secondary | ICD-10-CM

## 2016-12-26 NOTE — Patient Instructions (Signed)
Medication Instructions:   NO CHANGE  Testing/Procedures:  Your physician has requested that you have an echocardiogram. Echocardiography is a painless test that uses sound waves to create images of your heart. It provides your doctor with information about the size and shape of your heart and how well your heart's chambers and valves are working. This procedure takes approximately one hour. There are no restrictions for this procedure.   Your physician has requested that you have an abdominal aorta duplex. During this test, an ultrasound is used to evaluate the aorta. Allow 30 minutes for this exam. Do not eat after midnight the day before and avoid carbonated beverages   Follow-Up:  Your physician wants you to follow-up in: ONE YEAR WITH DR CRENSHAW You will receive a reminder letter in the mail two months in advance. If you don't receive a letter, please call our office to schedule the follow-up appointment.   If you need a refill on your cardiac medications before your next appointment, please call your pharmacy.  

## 2017-02-05 ENCOUNTER — Encounter: Payer: Self-pay | Admitting: *Deleted

## 2017-11-07 ENCOUNTER — Other Ambulatory Visit: Payer: Self-pay | Admitting: *Deleted

## 2017-11-07 DIAGNOSIS — I714 Abdominal aortic aneurysm, without rupture, unspecified: Secondary | ICD-10-CM

## 2017-11-07 DIAGNOSIS — I712 Thoracic aortic aneurysm, without rupture, unspecified: Secondary | ICD-10-CM

## 2018-04-08 ENCOUNTER — Other Ambulatory Visit: Payer: Self-pay | Admitting: Cardiology

## 2018-04-12 ENCOUNTER — Inpatient Hospital Stay: Admission: RE | Admit: 2018-04-12 | Payer: 59 | Source: Ambulatory Visit

## 2018-04-12 ENCOUNTER — Other Ambulatory Visit: Payer: 59

## 2018-04-26 ENCOUNTER — Ambulatory Visit
Admission: RE | Admit: 2018-04-26 | Discharge: 2018-04-26 | Disposition: A | Discharge status: Home / Self Care | Payer: 59 | Source: Ambulatory Visit | Attending: Cardiology | Admitting: Cardiology

## 2018-04-26 ENCOUNTER — Other Ambulatory Visit: Payer: 59

## 2018-04-26 DIAGNOSIS — I712 Thoracic aortic aneurysm, without rupture, unspecified: Secondary | ICD-10-CM

## 2018-04-26 MED ORDER — GADOBENATE DIMEGLUMINE 529 MG/ML IV SOLN
20.0000 mL | Freq: Once | INTRAVENOUS | Status: AC | PRN
  Administered 2018-04-26: 20 mL via INTRAVENOUS

## 2018-06-03 NOTE — Progress Notes (Signed)
HPI: FU atrial fibrillation, thoracic aortic aneurysm and mitral regurgitation. He had a TEE-guided cardioversion on July 13, 2008. His TEE showed normal LV function. He had prolapse of the posterior mitral valve leaflet with moderate mitral regurgitation (2+). He also had a patent foramen ovale. Stress echocardiogram in January of 2011 was normal. Last echocardiogram January 2017 showed normal LV function, moderate left ventricular hypertrophy, dilated aortic root, bileaflet mitral valve prolapse with moderate mitral regurgitation. Abdominal ultrasound January 2017 showed abdominal aortic aneurysm measuring 3.3 cm.   MRA August 2019 showed 4.9 cm dilated aortic root and dilated aortic arch measuring 3.8 cm.  Echocardiogram and abdominal ultrasound ordered at last office visit but not performed.  Since he was last seen,  he denies chest pain, dyspnea, palpitations or syncope.  Current Outpatient Medications  Medication Sig Dispense Refill  . Methylcobalamin POWD Inject 1 mL into the muscle 2 (two) times a week. Reported on 09/28/2015    . Multiple Vitamin (MULTIVITAMIN) tablet Take 1 tablet by mouth daily.      Marland Kitchen. diltiazem (CARTIA XT) 120 MG 24 hr capsule Take 1 capsule (120 mg total) by mouth daily. <PLEASE MAKE APPOINTMENT FOR REFILLS> (Patient not taking: Reported on 06/11/2018) 180 capsule 0   No current facility-administered medications for this visit.      Past Medical History:  Diagnosis Date  . Atrial fibrillation (HCC)   . Chronic insomnia   . History of rheumatic fever    age 62  . Mitral regurgitation   . Patent foramen ovale   . Thoracic aortic aneurysm [facility](HCC)     Past Surgical History:  Procedure Laterality Date  . CARDIOVERSION      Social History   Socioeconomic History  . Marital status: Married    Spouse name: Not on file  . Number of children: Not on file  . Years of education: Not on file  . Highest education level: Not on file  Occupational History  .  Not on file  Social Needs  . Financial resource strain: Not on file  . Food insecurity:    Worry: Not on file    Inability: Not on file  . Transportation needs:    Medical: Not on file    Non-medical: Not on file  Tobacco Use  . Smoking status: Never Smoker  . Smokeless tobacco: Never Used  Substance and Sexual Activity  . Alcohol use: No  . Drug use: No  . Sexual activity: Not on file  Lifestyle  . Physical activity:    Days per week: Not on file    Minutes per session: Not on file  . Stress: Not on file  Relationships  . Social connections:    Talks on phone: Not on file    Gets together: Not on file    Attends religious service: Not on file    Active member of club or organization: Not on file    Attends meetings of clubs or organizations: Not on file    Relationship status: Not on file  . Intimate partner violence:    Fear of current or ex partner: Not on file    Emotionally abused: Not on file    Physically abused: Not on file    Forced sexual activity: Not on file  Other Topics Concern  . Not on file  Social History Narrative   [employer]ages [employer]   Married   2 daughters ages 7427, 7920  Family History  Problem Relation Age of Onset  . Pulmonary fibrosis Mother   . Diverticulosis Father     ROS: no fevers or chills, productive cough, hemoptysis, dysphasia, odynophagia, melena, hematochezia, dysuria, hematuria, rash, seizure activity, orthopnea, PND, pedal edema, claudication. Remaining systems are negative.  Physical Exam: Well-developed well-nourished in no acute distress.  Skin is warm and dry.  HEENT is normal.  Neck is supple.  Chest is clear to auscultation with normal expansion.  Cardiovascular exam is regular rate and rhythm.  2/6 systolic murmur left sternal border. Abdominal exam nontender or distended. No masses palpated. Extremities show no edema. neuro grossly intact  ECG-sinus rhythm at a rate of 75.  No ST changes.   Personally reviewed  A/P  1 thoracic aortic aneurysm-patient will need a follow-up MRA August 2020.  2 mitral valve prolapse with history of moderate mitral regurgitation-we will repeat echocardiogram.    3 abdominal aortic aneurysm-we will arrange follow-up abdominal ultrasound.   4 paroxysmal atrial fibrillation-no recurrent atrial arrhythmias. CHADSvasc 0.  Continue aspirin.  We discontinued Cardizem previously and he understands his heart rate may be higher if atrial fibrillation recurs.  Olga Millers, MD

## 2018-06-11 ENCOUNTER — Encounter: Payer: Self-pay | Admitting: Cardiology

## 2018-06-11 ENCOUNTER — Ambulatory Visit: Payer: 59 | Admitting: Cardiology

## 2018-06-11 VITALS — BP 116/68 | HR 75 | Ht 74.0 in | Wt 232.0 lb

## 2018-06-11 DIAGNOSIS — I712 Thoracic aortic aneurysm, without rupture, unspecified: Secondary | ICD-10-CM

## 2018-06-11 DIAGNOSIS — I714 Abdominal aortic aneurysm, without rupture, unspecified: Secondary | ICD-10-CM

## 2018-06-11 DIAGNOSIS — I059 Rheumatic mitral valve disease, unspecified: Secondary | ICD-10-CM

## 2018-06-11 NOTE — Patient Instructions (Signed)
Medication Instructions:   NO CHANGE  Testing/Procedures:  Your physician has requested that you have an echocardiogram. Echocardiography is a painless test that uses sound waves to create images of your heart. It provides your doctor with information about the size and shape of your heart and how well your heart's chambers and valves are working. This procedure takes approximately one hour. There are no restrictions for this procedure.  Your physician has requested that you have an abdominal aorta duplex. During this test, an ultrasound is used to evaluate the aorta. Allow 30 minutes for this exam. Do not eat after midnight the day before and avoid carbonated beverages     Follow-Up:  Your physician recommends that you schedule a follow-up appointment in: July 2020   If you need a refill on your cardiac medications before your next appointment, please call your pharmacy.

## 2018-06-25 ENCOUNTER — Other Ambulatory Visit (HOSPITAL_BASED_OUTPATIENT_CLINIC_OR_DEPARTMENT_OTHER): Payer: 59

## 2018-06-27 ENCOUNTER — Ambulatory Visit (HOSPITAL_BASED_OUTPATIENT_CLINIC_OR_DEPARTMENT_OTHER): Admission: RE | Admit: 2018-06-27 | Payer: 59 | Source: Ambulatory Visit

## 2019-03-10 ENCOUNTER — Other Ambulatory Visit: Payer: Self-pay | Admitting: *Deleted

## 2019-03-10 DIAGNOSIS — I712 Thoracic aortic aneurysm, without rupture, unspecified: Secondary | ICD-10-CM

## 2019-03-10 DIAGNOSIS — I714 Abdominal aortic aneurysm, without rupture, unspecified: Secondary | ICD-10-CM

## 2019-03-11 ENCOUNTER — Telehealth: Payer: Self-pay | Admitting: *Deleted

## 2019-03-11 NOTE — Telephone Encounter (Signed)
Unable to reach Johnny [name] by phone today,a message was left for him to call me back.

## 2019-07-27 ENCOUNTER — Other Ambulatory Visit: Payer: Self-pay | Admitting: *Deleted

## 2019-08-29 ENCOUNTER — Other Ambulatory Visit: Payer: 59

## 2019-08-29 ENCOUNTER — Ambulatory Visit
Admission: RE | Admit: 2019-08-29 | Discharge: 2019-08-29 | Disposition: A | Discharge status: Home / Self Care | Payer: Self-pay | Source: Ambulatory Visit | Attending: Cardiology | Admitting: Cardiology

## 2019-08-29 ENCOUNTER — Other Ambulatory Visit: Payer: Self-pay

## 2019-08-29 ENCOUNTER — Ambulatory Visit
Admission: RE | Admit: 2019-08-29 | Discharge: 2019-08-29 | Disposition: A | Discharge status: Home / Self Care | Payer: 59 | Source: Ambulatory Visit | Attending: Cardiology | Admitting: Cardiology

## 2019-08-29 DIAGNOSIS — I714 Abdominal aortic aneurysm, without rupture, unspecified: Secondary | ICD-10-CM

## 2019-08-29 DIAGNOSIS — I712 Thoracic aortic aneurysm, without rupture, unspecified: Secondary | ICD-10-CM

## 2019-08-29 MED ORDER — GADOBENATE DIMEGLUMINE 529 MG/ML IV SOLN
20.0000 mL | Freq: Once | INTRAVENOUS | Status: AC | PRN
  Administered 2019-08-29: 20 mL via INTRAVENOUS

## 2020-04-12 ENCOUNTER — Encounter (HOSPITAL_BASED_OUTPATIENT_CLINIC_OR_DEPARTMENT_OTHER): Payer: Self-pay | Admitting: Emergency Medicine

## 2020-04-12 ENCOUNTER — Inpatient Hospital Stay (HOSPITAL_BASED_OUTPATIENT_CLINIC_OR_DEPARTMENT_OTHER)
Admission: EM | Admit: 2020-04-12 | Discharge: 2020-04-14 | DRG: 310 | Disposition: A | Discharge status: Home / Self Care | Payer: Medicare Other | Attending: Internal Medicine | Admitting: Internal Medicine

## 2020-04-12 ENCOUNTER — Other Ambulatory Visit: Payer: Self-pay

## 2020-04-12 ENCOUNTER — Telehealth: Payer: Self-pay | Admitting: Cardiology

## 2020-04-12 ENCOUNTER — Emergency Department (HOSPITAL_BASED_OUTPATIENT_CLINIC_OR_DEPARTMENT_OTHER): Payer: Medicare Other

## 2020-04-12 DIAGNOSIS — I4891 Unspecified atrial fibrillation: Secondary | ICD-10-CM | POA: Diagnosis not present

## 2020-04-12 DIAGNOSIS — I340 Nonrheumatic mitral (valve) insufficiency: Secondary | ICD-10-CM | POA: Diagnosis not present

## 2020-04-12 DIAGNOSIS — R079 Chest pain, unspecified: Secondary | ICD-10-CM | POA: Diagnosis present

## 2020-04-12 DIAGNOSIS — R42 Dizziness and giddiness: Secondary | ICD-10-CM | POA: Diagnosis present

## 2020-04-12 DIAGNOSIS — I712 Thoracic aortic aneurysm, without rupture, unspecified: Secondary | ICD-10-CM | POA: Diagnosis present

## 2020-04-12 DIAGNOSIS — Z91030 Bee allergy status: Secondary | ICD-10-CM

## 2020-04-12 DIAGNOSIS — Z20822 Contact with and (suspected) exposure to covid-19: Secondary | ICD-10-CM | POA: Diagnosis present

## 2020-04-12 DIAGNOSIS — F5104 Psychophysiologic insomnia: Secondary | ICD-10-CM | POA: Diagnosis present

## 2020-04-12 DIAGNOSIS — Z888 Allergy status to other drugs, medicaments and biological substances status: Secondary | ICD-10-CM

## 2020-04-12 DIAGNOSIS — I700 Atherosclerosis of aorta: Secondary | ICD-10-CM | POA: Diagnosis not present

## 2020-04-12 DIAGNOSIS — Z8679 Personal history of other diseases of the circulatory system: Secondary | ICD-10-CM

## 2020-04-12 DIAGNOSIS — I480 Paroxysmal atrial fibrillation: Secondary | ICD-10-CM | POA: Diagnosis not present

## 2020-04-12 DIAGNOSIS — Z79899 Other long term (current) drug therapy: Secondary | ICD-10-CM

## 2020-04-12 DIAGNOSIS — J849 Interstitial pulmonary disease, unspecified: Secondary | ICD-10-CM | POA: Diagnosis not present

## 2020-04-12 LAB — CBC
HCT: 48.7 % (ref 39.0–52.0)
Hemoglobin: 16.1 g/dL (ref 13.0–17.0)
MCH: 30.6 pg (ref 26.0–34.0)
MCHC: 33.1 g/dL (ref 30.0–36.0)
MCV: 92.4 fL (ref 80.0–100.0)
Platelets: 220 10*3/uL (ref 150–400)
RBC: 5.27 MIL/uL (ref 4.22–5.81)
RDW: 13.8 % (ref 11.5–15.5)
WBC: 9.5 10*3/uL (ref 4.0–10.5)
nRBC: 0 % (ref 0.0–0.2)

## 2020-04-12 LAB — SARS CORONAVIRUS 2 BY RT PCR (HOSPITAL ORDER, PERFORMED IN ~~LOC~~ HOSPITAL LAB): SARS Coronavirus 2: NEGATIVE

## 2020-04-12 LAB — TSH: TSH: 0.696 u[IU]/mL (ref 0.350–4.500)

## 2020-04-12 LAB — BASIC METABOLIC PANEL
Anion gap: 12 (ref 5–15)
BUN: 22 mg/dL (ref 8–23)
CO2: 24 mmol/L (ref 22–32)
Calcium: 9.3 mg/dL (ref 8.9–10.3)
Chloride: 103 mmol/L (ref 98–111)
Creatinine, Ser: 1.14 mg/dL (ref 0.61–1.24)
GFR calc Af Amer: >60 mL/min (ref >60)
GFR calc non Af Amer: >60 mL/min (ref >60)
Glucose, Bld: 118 mg/dL — ABNORMAL HIGH (ref 70–99)
Potassium: 4.2 mmol/L (ref 3.5–5.1)
Sodium: 139 mmol/L (ref 135–145)

## 2020-04-12 LAB — MRSA PCR SCREENING: MRSA by PCR: NEGATIVE

## 2020-04-12 LAB — TROPONIN I (HIGH SENSITIVITY)
Troponin I (High Sensitivity): 18 ng/L — ABNORMAL HIGH (ref <18)
Troponin I (High Sensitivity): 18 ng/L — ABNORMAL HIGH (ref <18)

## 2020-04-12 LAB — BRAIN NATRIURETIC PEPTIDE: B Natriuretic Peptide: 508.8 pg/mL — ABNORMAL HIGH (ref 0.0–100.0)

## 2020-04-12 LAB — MAGNESIUM: Magnesium: 1.9 mg/dL (ref 1.7–2.4)

## 2020-04-12 LAB — HIV ANTIBODY (ROUTINE TESTING W REFLEX): HIV Screen 4th Generation wRfx: NONREACTIVE

## 2020-04-12 MED ORDER — DILTIAZEM HCL-DEXTROSE 125-5 MG/125ML-% IV SOLN (PREMIX)
5.0000 mg/h | INTRAVENOUS | Status: DC
  Administered 2020-04-12: 10 mg/h via INTRAVENOUS
  Administered 2020-04-13: 15 mg/h via INTRAVENOUS
  Filled 2020-04-12 (×2): qty 125

## 2020-04-12 MED ORDER — ONDANSETRON HCL 4 MG/2ML IJ SOLN: 4.0000 mg | Freq: Four times a day (QID) | INTRAMUSCULAR | Status: DC | PRN

## 2020-04-12 MED ORDER — DILTIAZEM LOAD VIA INFUSION
10.0000 mg | Freq: Once | INTRAVENOUS | Status: DC
  Filled 2020-04-12: qty 10

## 2020-04-12 MED ORDER — METOPROLOL TARTRATE 12.5 MG HALF TABLET
12.5000 mg | ORAL_TABLET | Freq: Two times a day (BID) | ORAL | Status: DC
  Administered 2020-04-12 – 2020-04-13 (×3): 12.5 mg via ORAL
  Filled 2020-04-12 (×2): qty 1

## 2020-04-12 MED ORDER — DILTIAZEM LOAD VIA INFUSION
15.0000 mg | Freq: Once | INTRAVENOUS | Status: AC
  Administered 2020-04-12: 15 mg via INTRAVENOUS
  Filled 2020-04-12: qty 15

## 2020-04-12 MED ORDER — DILTIAZEM HCL-DEXTROSE 125-5 MG/125ML-% IV SOLN (PREMIX)
INTRAVENOUS | Status: AC
  Administered 2020-04-12: 10 mg via INTRAVENOUS
  Filled 2020-04-12: qty 125

## 2020-04-12 MED ORDER — SODIUM CHLORIDE 0.9 % IV SOLN: Freq: Once | INTRAVENOUS | Status: AC

## 2020-04-12 MED ORDER — NITROGLYCERIN 0.4 MG SL SUBL: 0.4000 mg | SUBLINGUAL_TABLET | SUBLINGUAL | Status: DC | PRN

## 2020-04-12 MED ORDER — DILTIAZEM HCL 25 MG/5ML IV SOLN
15.0000 mg | Freq: Once | INTRAVENOUS | Status: DC
  Filled 2020-04-12: qty 5

## 2020-04-12 MED ORDER — CHLORHEXIDINE GLUCONATE 0.12 % MT SOLN: 15.0000 mL | Freq: Two times a day (BID) | OROMUCOSAL | Status: DC

## 2020-04-12 MED ORDER — ORAL CARE MOUTH RINSE
15.0000 mL | Freq: Two times a day (BID) | OROMUCOSAL | Status: DC
  Administered 2020-04-12 – 2020-04-13 (×2): 15 mL via OROMUCOSAL

## 2020-04-12 MED ORDER — SODIUM CHLORIDE 0.9 % IV BOLUS
1000.0000 mL | Freq: Once | INTRAVENOUS | Status: AC
  Administered 2020-04-12: 1000 mL via INTRAVENOUS

## 2020-04-12 MED ORDER — ACETAMINOPHEN 325 MG PO TABS: 650.0000 mg | ORAL_TABLET | ORAL | Status: DC | PRN

## 2020-04-12 NOTE — ED Notes (Signed)
NPO SINCE [id number] (HALF A GLASS OF MILK)

## 2020-04-12 NOTE — Consult Note (Signed)
Cardiology Consultation:   Patient ID: Johnny [name] MRN: [id number]; DOB: [birth year]  Admit date: 04/12/2020 Date of Consult: 04/12/2020  Primary Care Provider: Patient, No Pcp Per [facility] Cardiologist: [facility] Electrophysiologist:  None    Patient Profile:   Johnny [name] is a 64 y.o. male with a hx of PAF , rheumatic fever, MR,   thoracic aneurysm ( 4.9 cm)   who is being seen today for the evaluation of  Atrial fib  at the request of  Dr. Benjamine Mola.  History of Present Illness:   [name] is a 64 year old gentleman who is followed by Dr. Jens Som.  He has a remote history of atrial fibrillation approximately 15 years ago.  He has a thoracic aneurysm that is been stable over the years.  On Sunday he remembers not feeling very well.  When he took his blood pressure it was noted to be irregular.  He was having some increasing shortness of breath with any sort of activity or exertion.  He presented to the med [facility] on July 27 and was found to have rapid atrial fibrillation.  I contemplated doing a cardioversion but because he could not be exactly sure of when the A. fib started, the decision was made to admit him for Eliquis initiation.  He was started on Cardizem drip.  His rate slowed some.  We added low-dose metoprolol   He is normally extremely active.  He he works out in his yard and does lots of yard work without any chest pain or shortness of breath.  Past Medical History:  Diagnosis Date  . Atrial fibrillation (HCC)   . Chronic insomnia   . History of rheumatic fever    age 1  . Mitral regurgitation   . Patent foramen ovale   . Thoracic aortic aneurysm [facility])     Past Surgical History:  Procedure Laterality Date  . CARDIOVERSION       Home Medications:  Prior to Admission medications   Not on File    Inpatient Medications: Scheduled Meds: . chlorhexidine  15 mL Mouth Rinse BID  . mouth rinse  15 mL Mouth Rinse q12n4p    Continuous Infusions: . diltiazem (CARDIZEM) infusion 15 mg/hr (04/12/20 1700)   PRN Meds: acetaminophen, ondansetron (ZOFRAN) IV  Allergies:    Allergies  Allergen Reactions  . Bee Venom Anaphylaxis  . Antihistamines, Chlorpheniramine-Type Other (See Comments)    Acts as a depressant with pt.    Social History:   Social History   Socioeconomic History  . Marital status: Married    Spouse name: Not on file  . Number of children: Not on file  . Years of education: Not on file  . Highest education level: Not on file  Occupational History  . Not on file  Tobacco Use  . Smoking status: Never Smoker  . Smokeless tobacco: Never Used  Substance and Sexual Activity  . Alcohol use: No  . Drug use: No  . Sexual activity: Not on file  Other Topics Concern  . Not on file  Social History Narrative   [employer]   Married   2 daughters ages 83, 49         Social Determinants of Health   Financial Resource Strain:   . Difficulty of Paying Living Expenses:   Food Insecurity:   . Worried About Programme researcher, broadcasting/film/video in the Last Year:   . Barista in  the Last Year:   Transportation Needs:   . Freight forwarder (Medical):   Marland Kitchen Lack of Transportation (Non-Medical):   Physical Activity:   . Days of Exercise per Week:   . Minutes of Exercise per Session:   Stress:   . Feeling of Stress :   Social Connections:   . Frequency of Communication with Friends and Family:   . Frequency of Social Gatherings with Friends and Family:   . Attends Religious Services:   . Active Member of Clubs or Organizations:   . Attends Banker Meetings:   Marland Kitchen Marital Status:   Intimate Partner Violence:   . Fear of Current or Ex-Partner:   . Emotionally Abused:   Marland Kitchen Physically Abused:   . Sexually Abused:     Family History:    Family History  Problem Relation Age of Onset  . Pulmonary fibrosis Mother   . Diverticulosis Father      ROS:  Please see  the history of present illness.   All other ROS reviewed and negative.     Physical Exam/Data:   Vitals:   04/12/20 1700 04/12/20 1710 04/12/20 1715 04/12/20 1719  BP: 103/79     Pulse: (!) 125 66 (!) 138 (!) 144  Resp: 22 (!) 27  20  Temp: 98.2 F (36.8 C)     TempSrc: Oral     SpO2: 95% 93%  93%  Weight: (!) 105.9 kg     Height: 6\' 2"  (1.88 m)       Intake/Output Summary (Last 24 hours) at 04/12/2020 1752 Last data filed at 04/12/2020 1700 Gross per 24 hour  Intake 959.2 ml  Output 225 ml  Net 734.2 ml   Last 3 Weights 04/12/2020 04/12/2020 04/12/2020  Weight (lbs) 233 lb 7.5 oz 236 lb 225 lb  Weight (kg) 105.9 kg 107.049 kg 102.059 kg     Body mass index is 29.98 kg/m.  General:  Well nourished, well developed, in no acute distress HEENT: normal Lymph: no adenopathy Neck: no JVD Endocrine:  No thryomegaly Vascular: No carotid bruits; FA pulses 2+ bilaterally without bruits  Cardiac:  Irreg. irreg Lungs:  clear to auscultation bilaterally, no wheezing, rhonchi or rales  Abd: soft, nontender, no hepatomegaly  Ext: no edema Musculoskeletal:  No deformities, BUE and BLE strength normal and equal Skin: warm and dry  Neuro:  CNs 2-12 intact, no focal abnormalities noted Psych:  Normal affect   EKG:  The EKG was personally reviewed and demonstrates:   Atrial fib with RVR  Telemetry:  Telemetry was personally reviewed and demonstrates:  AF with RVR   Relevant CV Studies:   Laboratory Data:  High Sensitivity Troponin:   Recent Labs  Lab 04/12/20 0854 04/12/20 1132  TROPONINIHS 18* 18*     Chemistry Recent Labs  Lab 04/12/20 0852  NA 139  K 4.2  CL 103  CO2 24  GLUCOSE 118*  BUN 22  CREATININE 1.14  CALCIUM 9.3  GFRNONAA >60  GFRAA >60  ANIONGAP 12    No results for input(s): PROT, ALBUMIN, AST, ALT, ALKPHOS, BILITOT in the last 168 hours. Hematology Recent Labs  Lab 04/12/20 0852  WBC 9.5  RBC 5.27  HGB 16.1  HCT 48.7  MCV 92.4  MCH 30.6    MCHC 33.1  RDW 13.8  PLT 220   BNPNo results for input(s): BNP, PROBNP in the last 168 hours.  DDimer No results for input(s): DDIMER in the last 168 hours.  Radiology/Studies:  DG Chest Port 1 View  Result Date: 04/12/2020 CLINICAL DATA:  Epigastric pain, lightheadedness EXAM: PORTABLE CHEST 1 VIEW COMPARISON:  07/12/2008 FINDINGS: Heart size is within normal limits. Atherosclerotic calcification of the aortic knob. Increased interstitial markings within the perihilar and bibasilar regions, left greater than right. No pleural effusion or pneumothorax. IMPRESSION: Increased interstitial markings within the perihilar and bibasilar regions, left greater than right. Findings may represent interstitial edema versus atypical/viral infection. Electronically Signed   By: Duanne Guess D.O.   On: 04/12/2020 09:41      Assessment and Plan:   1.   Atrial fib:   Much better on Dilt drip.   We added metoprolol 12.5 BID   The plan is for TEE / CV on  Thursday  CHADS2VASC is 1 ( age )   2.  Thoracic aortic dilatation:   Has been followed for years by Dr. Jens Som.     For questions or updates, please contact [facility] Please consult [email or web address] for contact info under    Signed, Kristeen Miss, MD  04/12/2020 5:52 PM

## 2020-04-12 NOTE — Telephone Encounter (Signed)
In review of pt sx and and dx pt notified to go to the ER for evaluation of noted sx-chest pain and afib

## 2020-04-12 NOTE — ED Notes (Signed)
ALLERGY STATUS REVIEWED

## 2020-04-12 NOTE — ED Notes (Addendum)
PATIENT BELONGINGS; ONE YELLOW METAL RING ON LEFT HAND, ONE PAIR OF TAN PANTS, ONE PAIR OF SOCKS, ONE PAIR OF GLASSES, ONE MOBILE PHONE WIFE IS TAKING HOME IS SUIT COAT, ONE SHIRT AND ONE TIE, T SHIRT AND ONE PAIR SHOES AND WATCH, WALLET

## 2020-04-12 NOTE — ED Provider Notes (Signed)
[facility] EMERGENCY DEPARTMENT Provider Note   CSN: [id number] Arrival date & time: 04/12/20  1324     History Chief Complaint  Patient presents with  . Chest Pain    Johnny [name] is a 64 y.o. male.  HPI   64 year old male with history of paroxysmal atrial fibrillation, rheumatic fever, mitral regurg, PFO, thoracic aortic aneurysm, who presents to the emergency department today for evaluation of chest pain.  Patient states that for the last 2-3 days he has been having lightheadedness upon standing and generalized fatigue.  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  He reports some associated midsternal chest pain/epigastric pain described as a pressure.  States pain has been mild and rates it at about a 2-3/10 however currently his pain is resolved.  He reports some associated dyspnea on exertion, palpitations and nausea.  Denies any lower extremity edema, orthopnea.  He called his cardiologist who advised him to come to the ED for assessment.  Past Medical History:  Diagnosis Date  . Atrial fibrillation (HCC)   . Chronic insomnia   . History of rheumatic fever    age 8  . Mitral regurgitation   . Patent foramen ovale   . Thoracic aortic aneurysm [facility])     Patient Active Problem List   Diagnosis Date Noted  . Atrial fibrillation with tachycardic ventricular rate (HCC) 04/12/2020  . Hyperglycemia 01/27/2013  . MTHFR mutation (HCC) 01/20/2013  . Anxiety 08/02/2011  . Insomnia 08/02/2011  . Aneurysm of thoracic aorta (HCC) 08/16/2010  . ABDOMINAL ANEURYSM WITHOUT MENTION OF RUPTURE 01/10/2010  . MITRAL REGURGITATION 08/29/2009  . INSOMNIA, CHRONIC 07/26/2008  . ATRIAL FIBRILLATION 07/26/2008    Past Surgical History:  Procedure Laterality Date  . CARDIOVERSION         Family History  Problem Relation Age of Onset  . Pulmonary fibrosis Mother   . Diverticulosis Father     Social History   Tobacco Use  . Smoking  status: Never Smoker  . Smokeless tobacco: Never Used  Substance Use Topics  . Alcohol use: No  . Drug use: No    Home Medications Prior to Admission medications   Medication Sig Start Date End Date Taking? Authorizing Provider  diltiazem (CARTIA XT) 120 MG 24 hr capsule Take 1 capsule (120 mg total) by mouth daily. <PLEASE MAKE APPOINTMENT FOR REFILLS> Patient not taking: Reported on 06/11/2018 11/12/16   Lewayne Bunting, MD  Methylcobalamin POWD Inject 1 mL into the muscle 2 (two) times a week. Reported on 09/28/2015 12/25/12   [provider]  Multiple Vitamin (MULTIVITAMIN) tablet Take 1 tablet by mouth daily.      [provider]    Allergies    Antihistamines, chlorpheniramine-type  Review of Systems   Review of Systems  Constitutional: Positive for fatigue. Negative for fever.  HENT: Negative for ear pain and sore throat.   Eyes: Negative for pain and visual disturbance.  Respiratory: Positive for shortness of breath. Negative for cough.   Cardiovascular: Positive for chest pain and palpitations. Negative for leg swelling.  Gastrointestinal: Positive for nausea. Negative for constipation, diarrhea and vomiting.       Epigastric pain  Genitourinary: Negative for dysuria and hematuria.  Musculoskeletal: Negative for back pain.  Skin: Negative for color change and rash.  Neurological: Positive for light-headedness. Negative for seizures and syncope.       Near syncopal  All other systems reviewed and are negative.  Physical Exam Updated Vital Signs BP (!) 88/78   Pulse (!) 150   Temp 98.1 F (36.7 C) (Oral)   Resp (!) 24   Ht 6\' 2"  (1.88 m)   Wt (!) 107 kg   SpO2 96%   BMI 30.30 kg/m   Physical Exam Vitals and nursing note reviewed.  Constitutional:      Appearance: He is well-developed.  HENT:     Head: Normocephalic and atraumatic.  Eyes:     Conjunctiva/sclera: Conjunctivae normal.  Cardiovascular:     Heart sounds: Murmur heard.       Comments: Tachycardic, irregularly irregular rhythm Pulmonary:     Effort: Pulmonary effort is normal. No respiratory distress.     Breath sounds: No decreased breath sounds, wheezing, rhonchi or rales.  Abdominal:     Palpations: Abdomen is soft.     Tenderness: There is no abdominal tenderness. There is no guarding or rebound.  Musculoskeletal:     Cervical back: Neck supple.     Right lower leg: No tenderness.     Left lower leg: No tenderness.  Skin:    General: Skin is warm and dry.  Neurological:     Mental Status: He is alert.     ED Results / Procedures / Treatments   Labs (all labs ordered are listed, but only abnormal results are displayed) Labs Reviewed  BASIC METABOLIC PANEL - Abnormal; Notable for the following components:      Result Value   Glucose, Bld 118 (*)    All other components within normal limits  TROPONIN I (HIGH SENSITIVITY) - Abnormal; Notable for the following components:   Troponin I (High Sensitivity) 18 (*)    All other components within normal limits  SARS CORONAVIRUS 2 BY RT PCR (HOSPITAL ORDER, PERFORMED IN [facility])  CBC  MAGNESIUM  TROPONIN I (HIGH SENSITIVITY)    EKG EKG Interpretation  Date/Time:  Tuesday April 12 2020 08:40:12 EDT Ventricular Rate:  168 PR Interval:    QRS Duration: 83 QT Interval:  284 QTC Calculation: 475 R Axis:   51 Text Interpretation: Atrial fibrillation with rapid V-rate RSR' in V1 or V2, probably normal variant Repolarization abnormality, prob rate related No STEMI Confirmed by [birth year]20[birth year]1981 [phone]) on 04/12/2020 9:18:47 AM   Radiology DG Chest Port 1 View  Result Date: 04/12/2020 CLINICAL DATA:  Epigastric pain, lightheadedness EXAM: PORTABLE CHEST 1 VIEW COMPARISON:  07/12/2008 FINDINGS: Heart size is within normal limits. Atherosclerotic calcification of the aortic knob. Increased interstitial markings within the perihilar and bibasilar regions, left greater than right. No pleural  effusion or pneumothorax. IMPRESSION: Increased interstitial markings within the perihilar and bibasilar regions, left greater than right. Findings may represent interstitial edema versus atypical/viral infection. Electronically Signed   By: 07/14/2008 D.O.   On: 04/12/2020 09:41    Procedures Procedures (including critical care time)  CRITICAL CARE Performed by: 04/14/2020   Total critical care time: 37 minutes  Critical care time was exclusive of separately billable procedures and treating other patients.  Critical care was necessary to treat or prevent imminent or life-threatening deterioration.  Critical care was time spent personally by me on the following activities: development of treatment plan with patient and/or surrogate as well as nursing, discussions with consultants, evaluation of patient's response to treatment, examination of patient, obtaining history from patient or surrogate, ordering and performing treatments and interventions, ordering and review of laboratory studies, ordering and review of radiographic studies,  pulse oximetry and re-evaluation of patient's condition.  9:35 AM  Cardiac monitoring reveals Afib with HR 160s (Rate & rhythm), as reviewed and interpreted by me. Cardiac monitoring was ordered due to afib with rvr and to monitor patient for dysrhythmia.  12:03 PM Cardiac monitoring reveals Afib with HR 137 (Rate & rhythm), as reviewed and interpreted by me. Cardiac monitoring was ordered due to afib treated with cardizem bolus/drip and to monitor patient for dysrhythmia.     Medications Ordered in ED Medications  diltiazem (CARDIZEM) 125 mg in dextrose 5% 125 mL (1 mg/mL) infusion (10 mg/hr Intravenous Rate/Dose Change 04/12/20 1149)  sodium chloride 0.9 % bolus 1,000 mL ( Intravenous Stopped 04/12/20 1030)  diltiazem (CARDIZEM) 1 mg/mL load via infusion 15 mg (15 mg Intravenous Bolus from Bag 04/12/20 0940)  0.9 %  sodium chloride infusion (  Intravenous New Bag/Given 04/12/20 1158)    ED Course  I have reviewed the triage vital signs and the nursing notes.  Pertinent labs & imaging results that were available during my care of the patient were reviewed by me and considered in my medical decision making (see chart for details).  Clinical Course as of Apr 12 1202  Tue Apr 12, 2020  1154 64 yo male presenting to ED with A Fib w/ RVR, onset of lightheadedness and fatigue on Sunday (2 days ago) in the afternoon.  He has a hx of parox A Fib but is not on blood thinners and says he takes "no medicines" for his heart.  Today his rate is 120-150 bpm and irregular, his BP has been low-normal but stable, and he is overall well appearing on physical exam.  His workup shows negative COVID test (he was not vaccinted), normal CBC and unremarkable BMP.  Initial trop is 18 likely from some demand ischemic; 2nd trop pending.  ECG per my interpretation does not appear acutely ischemic.  The patient received a diltiazem bolus and was started on a drip for rate control - thus far unsuccessful.  We spoke to cardiology who recommended a medical admission for A Fib evaluation.  Patient admitted to hospitalist.  He remains stable and asymptomatic in ED.   [MT]  1156 He does have a hx of thoracic aneurysm (4.9 cm) and AAA that is being monitored by his cardiologist.  He has no active chest pain or abdominal pain at this time to raise immediate concern for dissection or rupture.   [MT]    Clinical Course User Index [MT] Trifan, Matthew J, MD   MDM Rules/Calculators/A&P                          63  year old male presenting for evaluation of fatigue and lightheadedness.  EKG reveals A. fib with RVR, with a rate of 168.  Reviewed/interpreted labs and chest x-ray CBC without leukocytosis or anemia BMP with no significant electrolyte derangement, normal kidney function Magnesium normal Troponin marginally elevated at 18, likely due to demand Covid testing  negative  Chest x-ray with concern for possible atypical/viral infection versus interstitial edema  CHADSVasc = 1  Patient was given bolus and infusion of diltiazem however he remains in A. fib with RVR.  He is just about at 48 hours out from the onset of his symptoms.  Will consult cardiology for further recommendations regarding cardioversion.  10:10 AM CONSULT with Dr. Elease HashimotoNahser with cardiology who does not recommend cardioversion in the ED. Recommends admission to hospitalist service.  11:20 AM  CONSULT with Dr. Lajuana Ripple who accepts patient for admission.    Final Clinical Impression(s) / ED Diagnoses Final diagnoses:  Atrial fibrillation, unspecified type [facility])    Rx / DC Orders ED Discharge Orders         Ordered    Amb referral to AFIB Clinic     Discontinue  Reprint     04/12/20 0923           Karrie Meres, PA-C 04/12/20 1203    Terald Sleeper, MD 04/12/20 [phone]

## 2020-04-12 NOTE — ED Notes (Signed)
ED Provider at bedside. 

## 2020-04-12 NOTE — Progress Notes (Signed)
   04/12/20 1719  Vitals  Pulse Rate (!) 144  ECG Heart Rate (!) 125  Resp 20  Oxygen Therapy  SpO2 93 %  MEWS Score  MEWS Temp 0  MEWS Systolic 0  MEWS Pulse 2  MEWS RR 0  MEWS LOC 0  MEWS Score 2  MEWS Score Color Yellow  paged dr Elease Hashimoto, regarding pt here from [facility], Dr. Jacqulyn Bath wanted [place] to call Dr Elease Hashimoto to make aware of pt. Arriving to [facility]. Will come assess pt., will continue monitoring pt on cardizem gtt.

## 2020-04-12 NOTE — Telephone Encounter (Signed)
Patient c/o Palpitations:  High priority if patient c/o lightheadedness, shortness of breath, or chest pain  1) How long have you had palpitations/irregular HR/ Afib? Are you having the symptoms now? Atrial symptoms now- started Saturday night  2) Are you currently experiencing lightheadedness, SOB or CP? Yes- chest pain  3) Do you have a history of afib (atrial fibrillation) or irregular heart rhythm? yes 4) Have you checked your BP or HR? (document readings if available):  H 5)  6)  7) re you experiencing any other symptoms?

## 2020-04-12 NOTE — ED Notes (Signed)
[facility] Team at bedside 

## 2020-04-12 NOTE — ED Triage Notes (Signed)
Pt arrives POV with wife c/o epigastric and feeling lighted x 3 days. Pt endorses orthopnea and mild nausea, was carrying boxes outside when pain started. Pt also endorses fatigue. Pt tachycardic in triage

## 2020-04-12 NOTE — Plan of Care (Signed)

## 2020-04-12 NOTE — Progress Notes (Signed)
   04/12/20 1700  Assess: MEWS Score  Temp 98.2 F (36.8 C)  BP 103/79  Pulse Rate (!) 125  ECG Heart Rate (!) 130  Resp 22  Level of Consciousness Alert  SpO2 95 %  O2 Device Room Air  Assess: MEWS Score  MEWS Temp 0  MEWS Systolic 0  MEWS Pulse 3  MEWS RR 1  MEWS LOC 0  MEWS Score 4  MEWS Score Color Red  Assess: if the MEWS score is Yellow or Red  Were vital signs taken at a resting state? Yes  Focused Assessment No change from prior assessment  Early Detection of Sepsis Score *See Row Information* Low  MEWS guidelines implemented *See Row Information* Yes  Treat  MEWS Interventions Escalated (See documentation below)  Pain Scale 0-10  Pain Score 0  Take Vital Signs  Increase Vital Sign Frequency  Red: Q 1hr X 4 then Q 4hr X 4, if remains red, continue Q 4hrs  Escalate  MEWS: Escalate Red: discuss with charge nurse/RN and provider, consider discussing with RRT  Notify: Charge Nurse/RN  Name of Charge Nurse/RN Notified creshenda  Date Charge Nurse/RN Notified 04/12/20  Time Charge Nurse/RN Notified 1705  Notify: Provider  Provider Name/Title dr Jacqulyn Bath  Date Provider Notified 04/12/20  Time Provider Notified 1703  Notification Type Face-to-face  Notification Reason Other (Comment) (new admission)  Response See new orders  Date of Provider Response 04/12/20  Time of Provider Response 1703  Document  Patient Outcome Not stable and remains on department  Progress note created (see row info) Yes  Dr. Jacqulyn Bath at bedside assessing pt. Aware of MEWS, new orders rec'd for Echo and continue cardizem gtt. RN to notify Dr. Elease Hashimoto (cards) per Dr. Jacqulyn Bath request. No c/o pain

## 2020-04-12 NOTE — Progress Notes (Signed)
°   04/12/20 1900  Vitals  BP 98/69  MAP (mmHg) 75  Pulse Rate 85  ECG Heart Rate 96  Resp 23  Oxygen Therapy  SpO2 96 %  MEWS Score  MEWS Temp 0  MEWS Systolic 1  MEWS Pulse 0  MEWS RR 1  MEWS LOC 0  MEWS Score 2  MEWS Score Color Yellow  Dr Elease Hashimoto assess pt new orders rec'd for metropolol. Will continue to monitor.

## 2020-04-12 NOTE — H&P (Signed)
History and Physical    RAJAH TAGLIAFERRO LTJ:[id number] DOB: 1955-12-04 DOA: 04/12/2020  PCP: Patient, No Pcp Per  Patient coming from: [facility]  I have personally briefly reviewed patient's old medical records in [facility]  Chief Complaint: Palpitation and chest pain since 2 days  HPI: Johnny [name] is a 64 y.o. male with medical history significant of A. fib, thoracic aortic aneurysm, history of rheumatic fever presents to emergency department with mediastinal chest pain and palpitations since 2 days.    Patient tells me that he started having palpitation, lightheadedness, fatigue on Sunday afternoon.  Reports 3-4 out of 10 midsternal chest pain, pressure-like, nonradiating, denies association with shortness of breath, leg swelling, nausea, vomiting/diaphoresis.  No history of headache, blurry vision, seizures, fever, chills, cough, congestion, abdominal pain, urinary or bowel changes.  He was diagnosed with A. fib in 2007 and was on po Cardizem for 8 years.  He underwent cardioversion long time ago.  He is currently not taking any medications at home except as needed antacids for GERD.  He sees cardiology-Dr. Jens Som once in a year due to history of thoracic aortic aneurysm.   He tells me that he is very active, exercise and eats healthy food.  No history of smoking, alcohol, illicit drug use.  ED Course: Upon arrival to ED: Patient was noted to be in A. fib with RVR with heart rate in 168, blood pressure is on lower side, initial labs including CBC, CMP, magnesium, WNL, COVID-19 negative.  Troponin 18x2.  Chest x-ray concerning for interstitial edema versus atypical/viral infection.  Patient started on Cardizem drip.  EDP consulted cardiology recommended transfer patient to [facility] for further evaluation and management.  Review of Systems: As per HPI otherwise negative.    Past Medical History:  Diagnosis Date  . Atrial fibrillation (HCC)   . Chronic insomnia     . History of rheumatic fever    age 76  . Mitral regurgitation   . Patent foramen ovale   . Thoracic aortic aneurysm [facility])     Past Surgical History:  Procedure Laterality Date  . CARDIOVERSION       reports that he has never smoked. He has never used smokeless tobacco. He reports that he does not drink alcohol and does not use drugs.  Allergies  Allergen Reactions  . Bee Venom Anaphylaxis  . Antihistamines, Chlorpheniramine-Type Other (See Comments)    Acts as a depressant with pt.    Family History  Problem Relation Age of Onset  . Pulmonary fibrosis Mother   . Diverticulosis Father     Prior to Admission medications   Not on File    Physical Exam: Vitals:   04/12/20 1400 04/12/20 1430 04/12/20 1500 04/12/20 1545  BP: 121/74 99/69 (!) 107/62 102/73  Pulse: (!) 112 94 57 (!) 114  Resp: 20 (!) 29 19 22   Temp:      TempSrc:      SpO2: 92% 94% 95% 95%  Weight:      Height:        Constitutional: NAD, calm, comfortable, on room air Eyes: PERRL, lids and conjunctivae normal ENMT: Mucous membranes are moist. Posterior pharynx clear of any exudate or lesions.Normal dentition.  Neck: normal, supple, no masses, no thyromegaly Respiratory: clear to auscultation bilaterally, no wheezing, no crackles. Normal respiratory effort. No accessory muscle use.  Cardiovascular: Irregularly irregular rhythm, no murmurs / rubs / gallops. No extremity edema. 2+ pedal pulses. No carotid  bruits.  Abdomen: no tenderness, no masses palpated. No hepatosplenomegaly. Bowel sounds positive.  Musculoskeletal: no clubbing / cyanosis. No joint deformity upper and lower extremities. Good ROM, no contractures. Normal muscle tone.  Skin: no rashes, lesions, ulcers. No induration Neurologic: CN 2-12 grossly intact. Sensation intact, DTR normal. Strength 5/5 in all 4.  Psychiatric: Normal judgment and insight. Alert and oriented x 3. Normal mood.    Labs on Admission: I have personally reviewed  following labs and imaging studies  CBC: Recent Labs  Lab 04/12/20 0852  WBC 9.5  HGB 16.1  HCT 48.7  MCV 92.4  PLT 220   Basic Metabolic Panel: Recent Labs  Lab 04/12/20 0852  NA 139  K 4.2  CL 103  CO2 24  GLUCOSE 118*  BUN 22  CREATININE 1.14  CALCIUM 9.3  MG 1.9   GFR: Estimated Creatinine Clearance: 84.2 mL/min (by C-G formula based on SCr of 1.14 mg/dL). Liver Function Tests: No results for input(s): AST, ALT, ALKPHOS, BILITOT, PROT, ALBUMIN in the last 168 hours. No results for input(s): LIPASE, AMYLASE in the last 168 hours. No results for input(s): AMMONIA in the last 168 hours. Coagulation Profile: No results for input(s): INR, PROTIME in the last 168 hours. Cardiac Enzymes: No results for input(s): CKTOTAL, CKMB, CKMBINDEX, TROPONINI in the last 168 hours. BNP (last 3 results) No results for input(s): PROBNP in the last 8760 hours. HbA1C: No results for input(s): HGBA1C in the last 72 hours. CBG: No results for input(s): GLUCAP in the last 168 hours. Lipid Profile: No results for input(s): CHOL, HDL, LDLCALC, TRIG, CHOLHDL, LDLDIRECT in the last 72 hours. Thyroid Function Tests: No results for input(s): TSH, T4TOTAL, FREET4, T3FREE, THYROIDAB in the last 72 hours. Anemia Panel: No results for input(s): VITAMINB12, FOLATE, FERRITIN, TIBC, IRON, RETICCTPCT in the last 72 hours. Urine analysis: No results found for: COLORURINE, APPEARANCEUR, LABSPEC, PHURINE, GLUCOSEU, HGBUR, BILIRUBINUR, KETONESUR, PROTEINUR, UROBILINOGEN, NITRITE, LEUKOCYTESUR  Radiological Exams on Admission: DG Chest Port 1 View  Result Date: 04/12/2020 CLINICAL DATA:  Epigastric pain, lightheadedness EXAM: PORTABLE CHEST 1 VIEW COMPARISON:  07/12/2008 FINDINGS: Heart size is within normal limits. Atherosclerotic calcification of the aortic knob. Increased interstitial markings within the perihilar and bibasilar regions, left greater than right. No pleural effusion or pneumothorax.  IMPRESSION: Increased interstitial markings within the perihilar and bibasilar regions, left greater than right. Findings may represent interstitial edema versus atypical/viral infection. Electronically Signed   By: Duanne Guess D.O.   On: 04/12/2020 09:41    EKG: Independently reviewed.  A. fib with RVR.  Assessment/Plan Principal Problem:   Atrial fibrillation with tachycardic ventricular rate (HCC) Active Problems:   Aneurysm of thoracic aorta (HCC)   History of rheumatic fever    A. fib with RVR: -Patient has history of A. fib-currently not on any medications at home. -Upon arrival: His heart rate was noted to be in 160s.  Troponin 18x2.  Patient started on Cardizem drip in ED. -He is afebrile with no leukocytosis.  Initial labs including CBC, CMP: WNL, COVID-19 negative.  Reviewed chest x-ray. -Admit patient stepdown unit for close monitoring.  Continue Cardizem drip. -Check TSH, will get transthoracic echo. -CHA2DS2-VASc score: 1 (age)-will defer to start antiplatelet/anticoagulation to cardiology -Monitor electrolytes closely.  Monitor vitals closely.  Atypical chest pain: -Likely secondary to A. fib.  Troponin 18x2.  Reviewed EKG. -Denies ACS symptoms currently. -Nitro as needed for chest pain.  Aneurysm of thoracic aorta: Stable -Reviewed MRA chest on 08/29/2019- -Followed  by cardiology-Dr. Jens Som outpatient.  DVT prophylaxis: Lovenox/SCD Code Status: Full code Family Communication: None present at bedside.  Plan of care discussed with patient in length and he verbalized understanding and agreed with it. Disposition Plan: Home in 2 to 3 days Consults called: Cardiology Admission status: Inpatient   Ollen Bowl MD [facility]  If 7PM-7AM, please contact night-coverage [email or web address] Password [facility]  04/12/2020, 4:53 PM

## 2020-04-12 NOTE — ED Notes (Signed)
15MG  CARDIZEM BOLUS GIVEN, NBP AT 109/83

## 2020-04-12 NOTE — ED Notes (Signed)
Covid Swab obtained 

## 2020-04-13 ENCOUNTER — Inpatient Hospital Stay (HOSPITAL_COMMUNITY): Payer: Medicare Other

## 2020-04-13 DIAGNOSIS — I712 Thoracic aortic aneurysm, without rupture: Secondary | ICD-10-CM | POA: Diagnosis not present

## 2020-04-13 DIAGNOSIS — I4891 Unspecified atrial fibrillation: Secondary | ICD-10-CM | POA: Diagnosis not present

## 2020-04-13 DIAGNOSIS — I340 Nonrheumatic mitral (valve) insufficiency: Secondary | ICD-10-CM

## 2020-04-13 LAB — ECHOCARDIOGRAM COMPLETE
Height: 74 in
MV M vel: 4.53 m/s
MV Peak grad: 82.1 mmHg
S' Lateral: 2.8 cm
Weight: 3671.98 oz

## 2020-04-13 LAB — CBC
HCT: 44.6 % (ref 39.0–52.0)
Hemoglobin: 14.7 g/dL (ref 13.0–17.0)
MCH: 30.1 pg (ref 26.0–34.0)
MCHC: 33 g/dL (ref 30.0–36.0)
MCV: 91.2 fL (ref 80.0–100.0)
Platelets: 179 10*3/uL (ref 150–400)
RBC: 4.89 MIL/uL (ref 4.22–5.81)
RDW: 13.6 % (ref 11.5–15.5)
WBC: 9 10*3/uL (ref 4.0–10.5)
nRBC: 0 % (ref 0.0–0.2)

## 2020-04-13 LAB — URINALYSIS, ROUTINE W REFLEX MICROSCOPIC
Bilirubin Urine: NEGATIVE
Glucose, UA: NEGATIVE mg/dL
Hgb urine dipstick: NEGATIVE
Ketones, ur: NEGATIVE mg/dL
Leukocytes,Ua: NEGATIVE
Nitrite: NEGATIVE
Protein, ur: NEGATIVE mg/dL
Specific Gravity, Urine: 1.016 (ref 1.005–1.030)
pH: 6 (ref 5.0–8.0)

## 2020-04-13 LAB — BASIC METABOLIC PANEL
Anion gap: 11 (ref 5–15)
BUN: 13 mg/dL (ref 8–23)
CO2: 20 mmol/L — ABNORMAL LOW (ref 22–32)
Calcium: 8.7 mg/dL — ABNORMAL LOW (ref 8.9–10.3)
Chloride: 106 mmol/L (ref 98–111)
Creatinine, Ser: 1.05 mg/dL (ref 0.61–1.24)
GFR calc Af Amer: >60 mL/min (ref >60)
GFR calc non Af Amer: >60 mL/min (ref >60)
Glucose, Bld: 105 mg/dL — ABNORMAL HIGH (ref 70–99)
Potassium: 4.1 mmol/L (ref 3.5–5.1)
Sodium: 137 mmol/L (ref 135–145)

## 2020-04-13 LAB — PROTIME-INR
INR: 1.3 — ABNORMAL HIGH (ref 0.8–1.2)
Prothrombin Time: 15.6 seconds — ABNORMAL HIGH (ref 11.4–15.2)

## 2020-04-13 LAB — LIPID PANEL
Cholesterol: 165 mg/dL (ref 0–200)
HDL: 37 mg/dL — ABNORMAL LOW (ref >40)
LDL Cholesterol: 115 mg/dL — ABNORMAL HIGH (ref 0–99)
Total CHOL/HDL Ratio: 4.5 RATIO
Triglycerides: 65 mg/dL (ref <150)
VLDL: 13 mg/dL (ref 0–40)

## 2020-04-13 LAB — PROCALCITONIN: Procalcitonin: <0.1 ng/mL

## 2020-04-13 MED ORDER — SODIUM CHLORIDE 0.9 % IV SOLN
2.0000 g | INTRAVENOUS | Status: DC
Start: 2020-04-13 — End: 2020-04-13

## 2020-04-13 MED ORDER — DILTIAZEM HCL-DEXTROSE 125-5 MG/125ML-% IV SOLN (PREMIX)
5.0000 mg/h | INTRAVENOUS | Status: DC
  Administered 2020-04-13 – 2020-04-14 (×3): 15 mg/h via INTRAVENOUS
  Filled 2020-04-13 (×4): qty 125

## 2020-04-13 MED ORDER — APIXABAN 5 MG PO TABS
5.0000 mg | ORAL_TABLET | Freq: Two times a day (BID) | ORAL | Status: DC
  Administered 2020-04-13 – 2020-04-14 (×3): 5 mg via ORAL
  Filled 2020-04-13 (×3): qty 1

## 2020-04-13 MED ORDER — SODIUM CHLORIDE 0.9 % IV SOLN: INTRAVENOUS | Status: DC

## 2020-04-13 MED ORDER — AZITHROMYCIN 500 MG PO TABS: 500.0000 mg | ORAL_TABLET | Freq: Every day | ORAL | Status: DC

## 2020-04-13 NOTE — Progress Notes (Signed)
OT Cancellation Note  Patient Details Name: Johnny [name] MRN: [id number] DOB: 04/16/1956   Cancelled Treatment:    Reason Eval/Treat Not Completed: OT screened, no needs identified, will sign off; per RN pt mobilizing and performing ADL independently. Acute OT to sign off at this time, please re-consult should pt's needs change.  Marcy Siren, OT Acute Rehabilitation Services Pager [phone] Office [phone]   Orlando Penner 04/13/2020, 2:31 PM

## 2020-04-13 NOTE — Plan of Care (Signed)

## 2020-04-13 NOTE — Plan of Care (Signed)
  Problem: Education: Goal: Understanding of medication regimen will improve Outcome: Progressing   Problem: Cardiac: Goal: Ability to achieve and maintain adequate cardiopulmonary perfusion will improve Outcome: Progressing   Problem: Health Behavior/Discharge Planning: Goal: Ability to safely manage health-related needs after discharge will improve Outcome: Progressing   Problem: Health Behavior/Discharge Planning: Goal: Ability to manage health-related needs will improve Outcome: Progressing   Problem: Clinical Measurements: Goal: Ability to maintain clinical measurements within normal limits will improve Outcome: Progressing Goal: Will remain free from infection Outcome: Progressing Goal: Diagnostic test results will improve Outcome: Progressing Goal: Respiratory complications will improve Outcome: Progressing Goal: Cardiovascular complication will be avoided Outcome: Progressing   Problem: Activity: Goal: Risk for activity intolerance will decrease Outcome: Progressing   Problem: Nutrition: Goal: Adequate nutrition will be maintained Outcome: Progressing   Problem: Coping: Goal: Level of anxiety will decrease Outcome: Progressing   Problem: Elimination: Goal: Will not experience complications related to bowel motility Outcome: Progressing Goal: Will not experience complications related to urinary retention Outcome: Progressing   Problem: Pain Managment: Goal: General experience of comfort will improve Outcome: Progressing   Problem: Safety: Goal: Ability to remain free from injury will improve Outcome: Progressing   Problem: Skin Integrity: Goal: Risk for impaired skin integrity will decrease Outcome: Progressing

## 2020-04-13 NOTE — Progress Notes (Signed)
Progress Note  Patient Name: Johnny [name] Date of Encounter: 04/13/2020  [facility] Cardiologist: Olga Millers, MD    Subjective   64 year old gentleman with a history of atrial fibrillation many years ago.  He had cardioversion at that time.  He has a history of a thoracic aortic aneurysm and has been followed yearly by Dr. Jens Som.  He presented to med [facility] with rapid atrial fibrillation.  He was admitted for initiation of anticoagulation and for TEE cardioversion. His rate is better on diltiazem and metoprolol.  We initially meant to start Eliquis last night but due to the computer issues it will be started this morning.   Heart rate seems to be well controlled when he is at rest.  When he gets up and starts talking his heart rate goes up into the 120s.  He is no longer having significant shortness of breath. He did have some chills and a slight fever last night.  Dr. Zenaida Niece is looking into that.  Inpatient Medications    Scheduled Meds: . apixaban  5 mg Oral BID  . mouth rinse  15 mL Mouth Rinse q12n4p  . metoprolol tartrate  12.5 mg Oral BID   Continuous Infusions: . diltiazem (CARDIZEM) infusion     PRN Meds: acetaminophen, nitroGLYCERIN, ondansetron (ZOFRAN) IV   Vital Signs    Vitals:   04/13/20 0411 04/13/20 0614 04/13/20 0737 04/13/20 0757  BP:   112/66   Pulse:   77 85  Resp:   17 20  Temp: 99.1 F (37.3 C)  98.7 F (37.1 C) 98.7 F (37.1 C)  TempSrc: Oral  Oral   SpO2:   96% 92%  Weight:  (!) 104.1 kg    Height:        Intake/Output Summary (Last 24 hours) at 04/13/2020 0924 Last data filed at 04/13/2020 2774 Gross per 24 hour  Intake 1753.31 ml  Output 1725 ml  Net 28.31 ml   Last 3 Weights 04/13/2020 04/12/2020 04/12/2020  Weight (lbs) 229 lb 8 oz 233 lb 7.5 oz 236 lb  Weight (kg) 104.1 kg 105.9 kg 107.049 kg      Telemetry    Afib with RVR  - Personally Reviewed  ECG     Afib  - Personally Reviewed  Physical Exam    GEN: No acute distress.   Neck: No JVD Cardiac:  irreg.  Irreg.  Soft systolic murmur  Respiratory: Clear to auscultation bilaterally. GI: Soft, nontender, non-distended  MS: No edema; No deformity. Neuro:  Nonfocal  Psych: Normal affect   Labs    High Sensitivity Troponin:   Recent Labs  Lab 04/12/20 0854 04/12/20 1132  TROPONINIHS 18* 18*      Chemistry Recent Labs  Lab 04/12/20 0852 04/13/20 0143  NA 139 137  K 4.2 4.1  CL 103 106  CO2 24 20*  GLUCOSE 118* 105*  BUN 22 13  CREATININE 1.14 1.05  CALCIUM 9.3 8.7*  GFRNONAA >60 >60  GFRAA >60 >60  ANIONGAP 12 11     Hematology Recent Labs  Lab 04/12/20 0852 04/13/20 0143  WBC 9.5 9.0  RBC 5.27 4.89  HGB 16.1 14.7  HCT 48.7 44.6  MCV 92.4 91.2  MCH 30.6 30.1  MCHC 33.1 33.0  RDW 13.8 13.6  PLT 220 179    BNP Recent Labs  Lab 04/12/20 1650  BNP 508.8*     DDimer No results for input(s): DDIMER in the last 168 hours.  Radiology    DG Chest Port 1 View  Result Date: 04/12/2020 CLINICAL DATA:  Epigastric pain, lightheadedness EXAM: PORTABLE CHEST 1 VIEW COMPARISON:  07/12/2008 FINDINGS: Heart size is within normal limits. Atherosclerotic calcification of the aortic knob. Increased interstitial markings within the perihilar and bibasilar regions, left greater than right. No pleural effusion or pneumothorax. IMPRESSION: Increased interstitial markings within the perihilar and bibasilar regions, left greater than right. Findings may represent interstitial edema versus atypical/viral infection. Electronically Signed   By: Duanne Guess D.O.   On: 04/12/2020 09:41    Cardiac Studies      Patient Profile     64 y.o. male    Assessment & Plan     1.  Atrial fibrillation with rapid ventricular response.  Eliquis started this morning.  He is on diltiazem drip.  We will increase his metoprolol to 25 mg twice a day to help with rate control.  He will be getting an echocardiogram today.  The  plan is for TEE/cardioversion tomorrow.   2.  Thoracic aortic dilatation: Further management as an outpatient with Dr. Jens Som.       For questions or updates, please contact [facility] Please consult [email or web address] for contact info under        Signed, Kristeen Miss, MD  04/13/2020, 9:24 AM

## 2020-04-13 NOTE — TOC Benefit Eligibility Note (Signed)
Transition of Care [facility]) Benefit Eligibility Note    Patient Details  Name: Johnny [name] MRN: [id number] Date of Birth: August 24, 1956   Medication/Dose: ELIQUIS  5 MG BID  Covered?: Yes  Tier: 3 Drug  Prescription Coverage Preferred Pharmacy: [facility]  Spoke with Person/Company/Phone Number:: [facility]  @ [employer] # [phone]  Co-Pay: $ 47.00  Prior Approval: No  Deductible: Unmet       Mardene Sayer Phone Number: 04/13/2020, 5:13 PM

## 2020-04-13 NOTE — Progress Notes (Signed)
PT Cancellation Note  Patient Details Name: Johnny [name] MRN: [id number] DOB: February 17, 1956   Cancelled Treatment:    Reason Eval/Treat Not Completed: PT screened, no needs identified, will sign off. Nurse reports pt is independent with mobility.   Angelina Ok [facility] 04/13/2020, 1:44 PM Skip Mayer PT Acute Rehabilitation Services Pager [phone] Office [phone]

## 2020-04-13 NOTE — Progress Notes (Signed)
Noted that pt converted to nsr, called [facility] to find time converted. Converted to nsr at 1605 was not made aware of pt converting at that time.

## 2020-04-13 NOTE — Progress Notes (Signed)
°  Echocardiogram 2D Echocardiogram has been performed.  Johnny [name] 04/13/2020, 2:45 PM

## 2020-04-13 NOTE — Progress Notes (Signed)
Progress Note    Johnny [name]  ZHY:[id number] DOB: Jun 22, 1956  DOA: 04/12/2020 PCP: Patient, No Pcp Per    Brief Narrative:     Medical records reviewed and are as summarized below:  Johnny [name] is an 64 y.o. male with medical history significant of A. fib, thoracic aortic aneurysm, history of rheumatic fever presents to emergency department with mediastinal chest pain and palpitations since 2 days.    Started having palpitation, lightheadedness, fatigue on Saturday into Sunday.  Reports 3-4 out of 10 midsternal chest pain, pressure-like, nonradiating, denies association with shortness of breath, leg swelling, nausea, vomiting/diaphoresis.  He was diagnosed with A. fib in 2007 and was on po Cardizem for 8 years.  He underwent cardioversion long time ago.  He is currently not taking any medications at home except as needed antacids for GERD.  He sees cardiology-Dr. Jens Som once in a year due to history of thoracic aortic aneurysm.    Assessment/Plan:   Principal Problem:   Atrial fibrillation with tachycardic ventricular rate (HCC) Active Problems:   Aneurysm of thoracic aorta (HCC)   History of rheumatic fever   A. fib with RVR: -Patient has history of A. fib-currently not on any medications at home. -Upon arrival: His heart rate was noted to be in 160s.  Troponin 18x2.  Patient started on Cardizem drip in ED. -TSH normal -cardiology consult appreciated: eliquis, plan for TEE/cardioversion, metoprolol BID- increase as able  Atypical chest pain: -Likely secondary to A. fib.  Troponin 18x2.  Reviewed EKG. -Denies ACS symptoms currently. -Nitro as needed for chest pain.  Aneurysm of thoracic aorta: Stable -Reviewed MRA chest on 08/29/2019 -Followed by cardiology-Dr. Jens Som outpatient.  Episode of chills on PM of 7/27 -initially had fever but this stopped after removal of blanket -check U/A as c/o foul smelling urine -denies new cough, lungs essentially  clear   Family Communication/Anticipated D/C date and plan/Code Status   DVT prophylaxis: eliquis Code Status: Full Code.  Disposition Plan: Status is: Inpatient  Remains inpatient appropriate because:Hemodynamically unstable   Dispo: The patient is from: Home              Anticipated d/c is to: Home              Anticipated d/c date is: 2 days              Patient currently is not medically stable to d/c.         Medical Consultants:    cardiology  Subjective:   Had episode of chills last night -also had fever that nursing stated resolved after removal of blanket   Objective:    Vitals:   04/13/20 0411 04/13/20 0614 04/13/20 0737 04/13/20 0757  BP:   112/66   Pulse:   77 85  Resp:   17 20  Temp: 99.1 F (37.3 C)  98.7 F (37.1 C) 98.7 F (37.1 C)  TempSrc: Oral  Oral   SpO2:   96% 92%  Weight:  (!) 104.1 kg    Height:        Intake/Output Summary (Last 24 hours) at 04/13/2020 1013 Last data filed at 04/13/2020 2952 Gross per 24 hour  Intake 1753.31 ml  Output 1725 ml  Net 28.31 ml   Filed Weights   04/12/20 0856 04/12/20 1700 04/13/20 0614  Weight: (!) 107 kg (!) 105.9 kg (!) 104.1 kg    Exam:  General: Appearance:     Overweight male in  no acute distress     Lungs:     Clear to auscultation bilaterally, respirations unlabored  Heart:    Fast irregular HR  MS:   All extremities are intact.   Neurologic:   Awake, alert, oriented x 3. No apparent focal neurological           defect.     Data Reviewed:   I have personally reviewed following labs and imaging studies:  Labs: Labs show the following:   Basic Metabolic Panel: Recent Labs  Lab 04/12/20 0852 04/13/20 0143  NA 139 137  K 4.2 4.1  CL 103 106  CO2 24 20*  GLUCOSE 118* 105*  BUN 22 13  CREATININE 1.14 1.05  CALCIUM 9.3 8.7*  MG 1.9  --    GFR Estimated Creatinine Clearance: 90.3 mL/min (by C-G formula based on SCr of 1.05 mg/dL). Liver Function Tests: No results  for input(s): AST, ALT, ALKPHOS, BILITOT, PROT, ALBUMIN in the last 168 hours. No results for input(s): LIPASE, AMYLASE in the last 168 hours. No results for input(s): AMMONIA in the last 168 hours. Coagulation profile No results for input(s): INR, PROTIME in the last 168 hours.  CBC: Recent Labs  Lab 04/12/20 0852 04/13/20 0143  WBC 9.5 9.0  HGB 16.1 14.7  HCT 48.7 44.6  MCV 92.4 91.2  PLT 220 179   Cardiac Enzymes: No results for input(s): CKTOTAL, CKMB, CKMBINDEX, TROPONINI in the last 168 hours. BNP (last 3 results) No results for input(s): PROBNP in the last 8760 hours. CBG: No results for input(s): GLUCAP in the last 168 hours. D-Dimer: No results for input(s): DDIMER in the last 72 hours. Hgb A1c: No results for input(s): HGBA1C in the last 72 hours. Lipid Profile: Recent Labs    04/13/20 0143  CHOL 165  HDL 37*  LDLCALC 115*  TRIG 65  CHOLHDL 4.5   Thyroid function studies: Recent Labs    04/12/20 1650  TSH 0.696   Anemia work up: No results for input(s): VITAMINB12, FOLATE, FERRITIN, TIBC, IRON, RETICCTPCT in the last 72 hours. Sepsis Labs: Recent Labs  Lab 04/12/20 0852 04/13/20 0143  PROCALCITON  --  <0.10  WBC 9.5 9.0    Microbiology Recent Results (from the past 240 hour(s))  SARS Coronavirus 2 by RT PCR (hospital order, performed in [facility] hospital lab) Nasopharyngeal Nasopharyngeal Swab     Status: None   Collection Time: 04/12/20 10:07 AM   Specimen: Nasopharyngeal Swab  Result Value Ref Range Status   SARS Coronavirus 2 NEGATIVE NEGATIVE Final    Comment: (NOTE) SARS-CoV-2 target nucleic acids are NOT DETECTED.  The SARS-CoV-2 RNA is generally detectable in upper and lower respiratory specimens during the acute phase of infection. The lowest concentration of SARS-CoV-2 viral copies this assay can detect is 250 copies / mL. A negative result does not preclude SARS-CoV-2 infection and should not be used as the sole basis for  treatment or other patient management decisions.  A negative result may occur with improper specimen collection / handling, submission of specimen other than nasopharyngeal swab, presence of viral mutation(s) within the areas targeted by this assay, and inadequate number of viral copies (<250 copies / mL). A negative result must be combined with clinical observations, patient history, and epidemiological information.  Fact Sheet for Patients:   BoilerBrush.com.cy  Fact Sheet for Healthcare Providers: https://pope.com/  This test is not yet approved or  cleared by the [place] FDA and has been authorized for detection  and/or diagnosis of SARS-CoV-2 by FDA under an Emergency Use Authorization (EUA).  This EUA will remain in effect (meaning this test can be used) for the duration of the COVID-19 declaration under Section 564(b)(1) of the Act, 21 U.S.C. section 360bbb-3(b)(1), unless the authorization is terminated or revoked sooner.  Performed at [facility], [address] [place], [place] [address]   MRSA PCR Screening     Status: None   Collection Time: 04/12/20  5:00 PM   Specimen: Nasal Mucosa; Nasopharyngeal  Result Value Ref Range Status   MRSA by PCR NEGATIVE NEGATIVE Final    Comment:        The GeneXpert MRSA Assay (FDA approved for NASAL specimens only), is one component of a comprehensive MRSA colonization surveillance program. It is not intended to diagnose MRSA infection nor to guide or monitor treatment for MRSA infections. Performed at [facility], [address], [place], [place] [address]     Procedures and diagnostic studies:  DG Chest Port 1 View  Result Date: 04/12/2020 CLINICAL DATA:  Epigastric pain, lightheadedness EXAM: PORTABLE CHEST 1 VIEW COMPARISON:  07/12/2008 FINDINGS: Heart size is within normal limits. Atherosclerotic calcification of the aortic knob. Increased  interstitial markings within the perihilar and bibasilar regions, left greater than right. No pleural effusion or pneumothorax. IMPRESSION: Increased interstitial markings within the perihilar and bibasilar regions, left greater than right. Findings may represent interstitial edema versus atypical/viral infection. Electronically Signed   By: Duanne Guess D.O.   On: 04/12/2020 09:41    Medications:   . apixaban  5 mg Oral BID  . mouth rinse  15 mL Mouth Rinse q12n4p  . metoprolol tartrate  12.5 mg Oral BID   Continuous Infusions: . diltiazem (CARDIZEM) infusion 15 mg/hr (04/13/20 1007)     LOS: 1 day   Joseph Art  [facility]   How to contact the [facility] Attending or Consulting provider 7A - 7P or covering provider during after hours 7P -7A, for this patient?  1. Check the care team in [facility] and look for a) attending/consulting TRH provider listed and b) the [facility] team listed 2. Log into [email or web address] and use [place]'s universal password to access. If you do not have the password, please contact the hospital operator. 3. Locate the [facility] provider you are looking for under [facility] and page to a number that you can be directly reached. 4. If you still have difficulty reaching the provider, please page the [facility] (Director on Call) for the Hospitalists listed on amion for assistance.  04/13/2020, 10:13 AM

## 2020-04-13 NOTE — Progress Notes (Signed)
   04/13/20 0744  Assess: MEWS Score  ECG Heart Rate (!) 141  Assess: MEWS Score  MEWS Temp 0  MEWS Systolic 0  MEWS Pulse 3  MEWS RR 0  MEWS LOC 0  MEWS Score 3  MEWS Score Color Yellow  Assess: if the MEWS score is Yellow or Red  Were vital signs taken at a resting state? No (up walking to bathroom)  Focused Assessment No change from prior assessment  Early Detection of Sepsis Score *See Row Information* Low  MEWS guidelines implemented *See Row Information* No, vital signs rechecked  Document  Patient Outcome Other (Comment) (pt up walking to bathroom)  pt walking to bathroom

## 2020-04-14 ENCOUNTER — Other Ambulatory Visit (HOSPITAL_COMMUNITY): Payer: Medicare Other

## 2020-04-14 DIAGNOSIS — I4891 Unspecified atrial fibrillation: Secondary | ICD-10-CM | POA: Diagnosis not present

## 2020-04-14 SURGERY — ECHOCARDIOGRAM, TRANSESOPHAGEAL
Anesthesia: Monitor Anesthesia Care

## 2020-04-14 MED ORDER — METOPROLOL SUCCINATE ER 25 MG PO TB24: 25.0000 mg | ORAL_TABLET | Freq: Every day | ORAL | 0 refills | Status: DC

## 2020-04-14 MED ORDER — DILTIAZEM HCL ER COATED BEADS 240 MG PO CP24
240.0000 mg | ORAL_CAPSULE | Freq: Every day | ORAL | Status: DC
  Administered 2020-04-14: 240 mg via ORAL
  Filled 2020-04-14: qty 1

## 2020-04-14 MED ORDER — METOPROLOL SUCCINATE ER 25 MG PO TB24
25.0000 mg | ORAL_TABLET | Freq: Every day | ORAL | Status: DC
  Administered 2020-04-14: 25 mg via ORAL
  Filled 2020-04-14: qty 1

## 2020-04-14 MED ORDER — DILTIAZEM HCL ER COATED BEADS 240 MG PO CP24: 240.0000 mg | ORAL_CAPSULE | Freq: Every day | ORAL | 0 refills | Status: DC

## 2020-04-14 MED ORDER — APIXABAN 5 MG PO TABS: 5.0000 mg | ORAL_TABLET | Freq: Two times a day (BID) | ORAL | 0 refills | Status: DC

## 2020-04-14 MED ORDER — PROPRANOLOL HCL 10 MG PO TABS: 10.0000 mg | ORAL_TABLET | Freq: Four times a day (QID) | ORAL | Status: DC | PRN

## 2020-04-14 MED ORDER — PROPRANOLOL HCL 10 MG PO TABS: 10.0000 mg | ORAL_TABLET | Freq: Four times a day (QID) | ORAL | 0 refills | Status: DC | PRN

## 2020-04-14 NOTE — Progress Notes (Signed)
Progress Note  Patient Name: Johnny [name] Date of Encounter: 04/14/2020  [facility] Cardiologist: Olga Millers, MD    Subjective   64 year old gentleman with a history of atrial fibrillation many years ago.  He had cardioversion at that time.  He has a history of a thoracic aortic aneurysm and has been followed yearly by Dr. Jens Som.  He presented to med [facility] with rapid atrial fibrillation.  He was admitted for initiation of anticoagulation and for TEE cardioversion. His rate is better on diltiazem and metoprolol.  We initially meant to start Eliquis last night but due to the computer issues it will be started this morning.   He converted back to sinus rhythm on diltiazem and after the addition of metoprolol.  We will cancel the TEE cardioversion.  We will change his diltiazem to p.o. Cardizem CD 240 mg a day.  I will also add propranolol to take on an as-needed basis if he has episodes of breakthrough atrial fibrillation.  He is feeling well.  His questions were answered.  He is ready to be discharged.   Inpatient Medications    Scheduled Meds: . apixaban  5 mg Oral BID  . diltiazem  240 mg Oral Daily  . metoprolol succinate  25 mg Oral Daily  . metoprolol tartrate  12.5 mg Oral BID   Continuous Infusions: . sodium chloride 20 mL/hr at 04/13/20 1852   PRN Meds: acetaminophen, nitroGLYCERIN, ondansetron (ZOFRAN) IV, propranolol   Vital Signs    Vitals:   04/13/20 2325 04/14/20 0349 04/14/20 0502 04/14/20 0739  BP: 103/72 101/70  100/68  Pulse: 80 77  71  Resp: 20 19  18   Temp: 98.9 F (37.2 C) 98.2 F (36.8 C)  98.2 F (36.8 C)  TempSrc: Oral Oral  Oral  SpO2: 93% 92%  96%  Weight:   (!) 102.9 kg   Height:        Intake/Output Summary (Last 24 hours) at 04/14/2020 0858 Last data filed at 04/14/2020 0353 Gross per 24 hour  Intake 1194.75 ml  Output 600 ml  Net 594.75 ml   Last 3 Weights 04/14/2020 04/13/2020 04/12/2020  Weight (lbs) 226  lb 13.7 oz 229 lb 8 oz 233 lb 7.5 oz  Weight (kg) 102.9 kg 104.1 kg 105.9 kg      Telemetry    NSR   - Personally Reviewed  ECG    NSR  - Personally Reviewed  Physical Exam    Physical Exam: Blood pressure 100/68, pulse 71, temperature 98.2 F (36.8 C), temperature source Oral, resp. rate 18, height 6\' 2"  (1.88 m), weight (!) 102.9 kg, SpO2 96 %.  GEN:  Well nourished, well developed in no acute distress HEENT: Normal NECK: No JVD; No carotid bruits LYMPHATICS: No lymphadenopathy CARDIAC: RRR,  Soft systolic murmur  RESPIRATORY:  Clear to auscultation without rales, wheezing or rhonchi  ABDOMEN: Soft, non-tender, non-distended MUSCULOSKELETAL:  No edema; No deformity  SKIN: Warm and dry NEUROLOGIC:  Alert and oriented x 3   Labs    High Sensitivity Troponin:   Recent Labs  Lab 04/12/20 0854 04/12/20 1132  TROPONINIHS 18* 18*      Chemistry Recent Labs  Lab 04/12/20 0852 04/13/20 0143  NA 139 137  K 4.2 4.1  CL 103 106  CO2 24 20*  GLUCOSE 118* 105*  BUN 22 13  CREATININE 1.14 1.05  CALCIUM 9.3 8.7*  GFRNONAA >60 >60  GFRAA >60 >60  ANIONGAP 12 11  Hematology Recent Labs  Lab 04/12/20 0852 04/13/20 0143  WBC 9.5 9.0  RBC 5.27 4.89  HGB 16.1 14.7  HCT 48.7 44.6  MCV 92.4 91.2  MCH 30.6 30.1  MCHC 33.1 33.0  RDW 13.8 13.6  PLT 220 179    BNP Recent Labs  Lab 04/12/20 1650  BNP 508.8*     DDimer No results for input(s): DDIMER in the last 168 hours.   Radiology    DG Chest Port 1 View  Result Date: 04/12/2020 CLINICAL DATA:  Epigastric pain, lightheadedness EXAM: PORTABLE CHEST 1 VIEW COMPARISON:  07/12/2008 FINDINGS: Heart size is within normal limits. Atherosclerotic calcification of the aortic knob. Increased interstitial markings within the perihilar and bibasilar regions, left greater than right. No pleural effusion or pneumothorax. IMPRESSION: Increased interstitial markings within the perihilar and bibasilar regions, left  greater than right. Findings may represent interstitial edema versus atypical/viral infection. Electronically Signed   By: Duanne Guess D.O.   On: 04/12/2020 09:41   ECHOCARDIOGRAM COMPLETE  Result Date: 04/13/2020    ECHOCARDIOGRAM REPORT   Patient Name:   Johnny [name] Date of Exam: 04/13/2020 Medical Rec #:  [id number]     Height:       74.0 in Accession #:    [id number]    Weight:       229.5 lb Date of Birth:  1956/04/30     BSA:          2.304 m Patient Age:    63 years      BP:           102/77 mmHg Patient Gender: M             HR:           96 bpm. Exam Location:  Inpatient Procedure: 2D Echo Indications:    Atrial Fibrillation  History:        Patient has prior history of Echocardiogram examinations, most                 recent 10/12/2015. Mitral Valve Prolapse.  Sonographer:    Thurman Coyer RDCS (AE) Referring Phys: [id number] Kasandra Knudsen PAHWANI IMPRESSIONS  1. Left ventricular ejection fraction, by estimation, is 60 to 65%. The left ventricle has normal function. The left ventricle has no regional wall motion abnormalities. There is mild concentric left ventricular hypertrophy. Left ventricular diastolic parameters are indeterminate.  2. Right ventricular systolic function is normal. The right ventricular size is normal. There is normal pulmonary artery systolic pressure.  3. Left atrial size was moderately dilated.  4. The mitral valve is normal in structure. Mild mitral valve regurgitation. No evidence of mitral stenosis.  5. The aortic valve is tricuspid. Aortic valve regurgitation is not visualized. No aortic stenosis is present.  6. Aortic dilatation noted. Aneurysm of the aortic root, measuring 48 mm. There is moderate dilatation at the level of the sinuses of Valsalva measuring 46 mm.  7. The inferior vena cava is dilated in size with <50% respiratory variability, suggesting right atrial pressure of 15 mmHg. FINDINGS  Left Ventricle: Left ventricular ejection fraction, by estimation, is 60 to  65%. The left ventricle has normal function. The left ventricle has no regional wall motion abnormalities. The left ventricular internal cavity size was normal in size. There is  mild concentric left ventricular hypertrophy. Left ventricular diastolic parameters are indeterminate. Right Ventricle: The right ventricular size is normal. No increase in right ventricular wall thickness. Right ventricular systolic function  is normal. There is normal pulmonary artery systolic pressure. The tricuspid regurgitant velocity is 1.97 m/s, and  with an assumed right atrial pressure of 15 mmHg, the estimated right ventricular systolic pressure is 30.5 mmHg. Left Atrium: Left atrial size was moderately dilated. Right Atrium: Right atrial size was normal in size. Pericardium: There is no evidence of pericardial effusion. Mitral Valve: The mitral valve is normal in structure. There is mild late systolic prolapse of posterior leaflet of the mitral valve. There is moderate thickening of the mitral valve leaflet(s). Normal mobility of the mitral valve leaflets. Mild mitral valve regurgitation. No evidence of mitral valve stenosis. Tricuspid Valve: The tricuspid valve is normal in structure. Tricuspid valve regurgitation is trivial. No evidence of tricuspid stenosis. Aortic Valve: The aortic valve is tricuspid. Aortic valve regurgitation is not visualized. No aortic stenosis is present. Pulmonic Valve: The pulmonic valve was normal in structure. Pulmonic valve regurgitation is not visualized. No evidence of pulmonic stenosis. Aorta: Aortic dilatation noted. There is moderate dilatation at the level of the sinuses of Valsalva measuring 46 mm. There is an aneurysm involving the aortic root. The aneurysm measures 48 mm. Venous: The inferior vena cava is dilated in size with less than 50% respiratory variability, suggesting right atrial pressure of 15 mmHg. The inferior vena cava and the hepatic vein show a normal flow pattern. IAS/Shunts:  No atrial level shunt detected by color flow Doppler.  LEFT VENTRICLE PLAX 2D LVIDd:         4.40 cm LVIDs:         2.80 cm LV PW:         1.10 cm LV IVS:        1.10 cm LVOT diam:     2.50 cm LVOT Area:     4.91 cm  RIGHT VENTRICLE TAPSE (M-mode): 2.0 cm LEFT ATRIUM             Index       RIGHT ATRIUM           Index LA diam:        4.10 cm 1.78 cm/m  RA Area:     19.10 cm LA Vol (A2C):   75.1 ml 32.59 ml/m RA Volume:   51.20 ml  22.22 ml/m LA Vol (A4C):   78.6 ml 34.11 ml/m LA Biplane Vol: 78.3 ml 33.98 ml/m   AORTA Ao Root diam: 4.60 cm Ao Asc diam:  4.80 cm MR Peak grad: 82.1 mmHg   TRICUSPID VALVE MR Mean grad: 44.0 mmHg   TR Peak grad:   15.5 mmHg MR Vmax:      453.00 cm/s TR Vmax:        197.00 cm/s MR Vmean:     316.0 cm/s                           SHUNTS                           Systemic Diam: 2.50 cm Chilton Siiffany [place] MD Electronically signed by Chilton Siiffany [place] MD Signature Date/Time: 04/13/2020/5:50:30 PM    Final     Cardiac Studies      Patient Profile     64 y.o. male    Assessment & Plan     1.  Atrial fibrillation with rapid ventricular response.   He was started on Eliquis yesterday.  He converted to sinus rhythm on IV  diltiazem.  We also had added low-dose metoprolol.  We will cancel the TEE cardioversion.  We will change him to Cardizem CD 240 mg a day, Toprol-XL 25 mg a day.  I will also add propranolol 10 mg to take on an as-needed basis if he has episodes of breakthrough atrial fibrillation.  He had numerous questions which were all answered.    2.  Thoracic aortic dilatation: Further management as an outpatient with Dr. Jens Som.  3.  Mitral regurgitation: He has mild mitral regurgitation.  Ok for DC      For questions or updates, please contact [facility] Please consult [email or web address] for contact info under        Signed, Kristeen Miss, MD  04/14/2020, 8:58 AM

## 2020-04-14 NOTE — TOC Transition Note (Signed)
Transition of Care [facility]) - CM/SW Discharge Note   Patient Details  Name: Johnny [name] MRN: [id number] Date of Birth: 15-Apr-1956  [facility] CM/SW Contact:  Leone Haven, RN Phone Number: 04/14/2020, 10:17 AM   Clinical Narrative:    NCM spoke with patient, informed him of the copay amt for his eliquis refill of 47.00.  TOC pharmacy is filing the first 30 day free for him and will bring to room prior to dc.    Final next level of care: Home/Self Care Barriers to Discharge: No Barriers Identified   Patient Goals and CMS Choice        Discharge Placement                       Discharge Plan and Services                                     Social Determinants of Health (SDOH) Interventions     Readmission Risk Interventions No flowsheet data found.

## 2020-04-14 NOTE — Progress Notes (Signed)
Discharge instructions given to patient with spouse at bedside. New medications and follow up appointments were given. Patient verbalized understanding of discharge orders and is discharging home with wife.

## 2020-04-14 NOTE — Discharge Instructions (Signed)
Atrial Fibrillation  Atrial fibrillation is a type of heartbeat that is irregular or fast. If you have this condition, your heart beats without any order. This makes it hard for your heart to pump blood in a normal way. Atrial fibrillation may come and go, or it may become a long-lasting problem. If this condition is not treated, it can put you at higher risk for stroke, heart failure, and other heart problems. What are the causes? This condition may be caused by diseases that damage the heart. They include:  High blood pressure.  Heart failure.  Heart valve disease.  Heart surgery. Other causes include:  Diabetes.  Thyroid disease.  Being overweight.  Kidney disease. Sometimes the cause is not known. What increases the risk? You are more likely to develop this condition if:  You are older.  You smoke.  You exercise often and very hard.  You have a family history of this condition.  You are a man.  You use drugs.  You drink a lot of alcohol.  You have lung conditions, such as emphysema, pneumonia, or COPD.  You have sleep apnea. What are the signs or symptoms? Common symptoms of this condition include:  A feeling that your heart is beating very fast.  Chest pain or discomfort.  Feeling short of breath.  Suddenly feeling light-headed or weak.  Getting tired easily during activity.  Fainting.  Sweating. In some cases, there are no symptoms. How is this treated? Treatment for this condition depends on underlying conditions and how you feel when you have atrial fibrillation. They include:  Medicines to: ? Prevent blood clots. ? Treat heart rate or heart rhythm problems.  Using devices, such as a pacemaker, to correct heart rhythm problems.  Doing surgery to remove the part of the heart that sends bad signals.  Closing an area where clots can form in the heart (left atrial appendage). In some cases, your doctor will treat other underlying  conditions. Follow these instructions at home: Medicines  Take over-the-counter and prescription medicines only as told by your doctor.  Do not take any new medicines without first talking to your doctor.  If you are taking blood thinners: ? Talk with your doctor before you take any medicines that have aspirin or NSAIDs, such as ibuprofen, in them. ? Take your medicine exactly as told by your doctor. Take it at the same time each day. ? Avoid activities that could hurt or bruise you. Follow instructions about how to prevent falls. ? Wear a bracelet that says you are taking blood thinners. Or, carry a card that lists what medicines you take. Lifestyle      Do not use any products that have nicotine or tobacco in them. These include cigarettes, e-cigarettes, and chewing tobacco. If you need help quitting, ask your doctor.  Eat heart-healthy foods. Talk with your doctor about the right eating plan for you.  Exercise regularly as told by your doctor.  Do not drink alcohol.  Lose weight if you are overweight.  Do not use drugs, including cannabis. General instructions  If you have a condition that causes breathing to stop for a short period of time (apnea), treat it as told by your doctor.  Keep a healthy weight. Do not use diet pills unless your doctor says they are safe for you. Diet pills may make heart problems worse.  Keep all follow-up visits as told by your doctor. This is important. Contact a doctor if:  You notice a change   in the speed, rhythm, or strength of your heartbeat.  You are taking a blood-thinning medicine and you get more bruising.  You get tired more easily when you move or exercise.  You have a sudden change in weight. Get help right away if:   You have pain in your chest or your belly (abdomen).  You have trouble breathing.  You have side effects of blood thinners, such as blood in your vomit, poop (stool), or pee (urine), or bleeding that cannot  stop.  You have any signs of a stroke. "BE FAST" is an easy way to remember the main warning signs: ? B - Balance. Signs are dizziness, sudden trouble walking, or loss of balance. ? E - Eyes. Signs are trouble seeing or a change in how you see. ? F - Face. Signs are sudden weakness or loss of feeling in the face, or the face or eyelid drooping on one side. ? A - Arms. Signs are weakness or loss of feeling in an arm. This happens suddenly and usually on one side of the body. ? S - Speech. Signs are sudden trouble speaking, slurred speech, or trouble understanding what people say. ? T - Time. Time to call emergency services. Write down what time symptoms started.  You have other signs of a stroke, such as: ? A sudden, very bad headache with no known cause. ? Feeling like you may vomit (nausea). ? Vomiting. ? A seizure. These symptoms may be an emergency. Do not wait to see if the symptoms will go away. Get medical help right away. Call your local emergency services (911 in the [place] Do not drive yourself to the hospital. Summary  Atrial fibrillation is a type of heartbeat that is irregular or fast.  You are at higher risk of this condition if you smoke, are older, have diabetes, or are overweight.  Follow your doctor's instructions about medicines, diet, exercise, and follow-up visits.  Get help right away if you have signs or symptoms of a stroke.  Get help right away if you cannot catch your breath, or you have chest pain or discomfort. This information is not intended to replace advice given to you by your health care provider. Make sure you discuss any questions you have with your health care provider. Document Revised: 02/25/2019 Document Reviewed: 02/25/2019 Elsevier Patient Education  [place].   Atrial Fibrillation  Atrial fibrillation is a type of irregular or rapid heartbeat (arrhythmia). In atrial fibrillation, the top part of the heart (atria) beats in an  irregular pattern. This makes the heart unable to pump blood normally and effectively. The goal of treatment is to prevent blood clots from forming, control your heart rate, or restore your heartbeat to a normal rhythm. If this condition is not treated, it can cause serious problems, such as a weakened heart muscle (cardiomyopathy) or a stroke. What are the causes? This condition is often caused by medical conditions that damage the heart's electrical system. These include:  High blood pressure (hypertension). This is the most common cause.  Certain heart problems or conditions, such as heart failure, coronary artery disease, heart valve problems, or heart surgery.  Diabetes.  Overactive thyroid (hyperthyroidism).  Obesity.  Chronic kidney disease. In some cases, the cause of this condition is not known. What increases the risk? This condition is more likely to develop in:  Older people.  People who smoke.  Athletes who do endurance exercise.  People who have a family history of atrial  fibrillation.  Men.  People who use drugs.  People who drink a lot of alcohol.  People who have lung conditions, such as emphysema, pneumonia, or COPD.  People who have obstructive sleep apnea. What are the signs or symptoms? Symptoms of this condition include:  A feeling that your heart is racing or beating irregularly.  Discomfort or pain in your chest.  Shortness of breath.  Sudden light-headedness or weakness.  Tiring easily during exercise or activity.  Fatigue.  Syncope (fainting).  Sweating. In some cases, there are no symptoms. How is this diagnosed? Your health care provider may detect atrial fibrillation when taking your pulse. If detected, this condition may be diagnosed with:  An electrocardiogram (ECG) to check electrical signals of the heart.  An ambulatory cardiac monitor to record your heart's activity for a few days.  A transthoracic echocardiogram (TTE)  to create pictures of your heart.  A transesophageal echocardiogram (TEE) to create even closer pictures of your heart.  A stress test to check your blood supply while you exercise.  Imaging tests, such as a CT scan or chest X-ray.  Blood tests. How is this treated? Treatment depends on underlying conditions and how you feel when you experience atrial fibrillation. This condition may be treated with:  Medicines to prevent blood clots or to treat heart rate or heart rhythm problems.  Electrical cardioversion to reset the heart's rhythm.  A pacemaker to correct abnormal heart rhythm.  Ablation to remove the heart tissue that sends abnormal signals.  Left atrial appendage closure to seal the area where blood clots can form. In some cases, underlying conditions will be treated. Follow these instructions at home: Medicines  Take over-the counter and prescription medicines only as told by your health care provider.  Do not take any new medicines without talking to your health care provider.  If you are taking blood thinners: ? Talk with your health care provider before you take any medicines that contain aspirin or NSAIDs, such as ibuprofen. These medicines increase your risk for dangerous bleeding. ? Take your medicine exactly as told, at the same time every day. ? Avoid activities that could cause injury or bruising, and follow instructions about how to prevent falls. ? Wear a medical alert bracelet or carry a card that lists what medicines you take. Lifestyle      Do not use any products that contain nicotine or tobacco, such as cigarettes, e-cigarettes, and chewing tobacco. If you need help quitting, ask your health care provider.  Eat heart-healthy foods. Talk with a dietitian to make an eating plan that is right for you.  Exercise regularly as told by your health care provider.  Do not drink alcohol.  Lose weight if you are overweight.  Do not use drugs, including  cannabis. General instructions  If you have obstructive sleep apnea, manage your condition as told by your health care provider.  Do not use diet pills unless your health care provider approves. Diet pills can make heart problems worse.  Keep all follow-up visits as told by your health care provider. This is important. Contact a health care provider if you:  Notice a change in the rate, rhythm, or strength of your heartbeat.  Are taking a blood thinner and you notice more bruising.  Tire more easily when you exercise or do heavy work.  Have a sudden change in weight. Get help right away if you have:   Chest pain, abdominal pain, sweating, or weakness.  Trouble breathing.  Side effects of blood thinners, such as blood in your vomit, stool, or urine, or bleeding that cannot stop.  Any symptoms of a stroke. "BE FAST" is an easy way to remember the main warning signs of a stroke: ? B - Balance. Signs are dizziness, sudden trouble walking, or loss of balance. ? E - Eyes. Signs are trouble seeing or a sudden change in vision. ? F - Face. Signs are sudden weakness or numbness of the face, or the face or eyelid drooping on one side. ? A - Arms. Signs are weakness or numbness in an arm. This happens suddenly and usually on one side of the body. ? S - Speech. Signs are sudden trouble speaking, slurred speech, or trouble understanding what people say. ? T - Time. Time to call emergency services. Write down what time symptoms started.  Other signs of a stroke, such as: ? A sudden, severe headache with no known cause. ? Nausea or vomiting. ? Seizure. These symptoms may represent a serious problem that is an emergency. Do not wait to see if the symptoms will go away. Get medical help right away. Call your local emergency services (911 in the [place] Do not drive yourself to the hospital. Summary  Atrial fibrillation is a type of irregular or rapid heartbeat (arrhythmia).  Symptoms include  a feeling that your heart is beating fast or irregularly.  You may be given medicines to prevent blood clots or to treat heart rate or heart rhythm problems.  Get help right away if you have signs or symptoms of a stroke.  Get help right away if you cannot catch your breath or have chest pain or pressure. This information is not intended to replace advice given to you by your health care provider. Make sure you discuss any questions you have with your health care provider. Document Revised: 02/25/2019 Document Reviewed: 02/25/2019 Elsevier Patient Education  2020 [employer]   Chest Wall Pain Chest wall pain is pain in or around the bones and muscles of your chest. Sometimes, an injury causes this pain. Excessive coughing or overuse of arm and chest muscles may also cause chest wall pain. Sometimes, the cause may not be known. This pain may take several weeks or longer to get better. Follow these instructions at home: Managing pain, stiffness, and swelling   If directed, put ice on the painful area: ? Put ice in a plastic bag. ? Place a towel between your skin and the bag. ? Leave the ice on for 20 minutes, 2-3 times per day. Activity  Rest as told by your health care provider.  Avoid activities that cause pain. These include any activities that use your chest muscles or your abdominal and side muscles to lift heavy items. Ask your health care provider what activities are safe for you. General instructions   Take over-the-counter and prescription medicines only as told by your health care provider.  Do not use any products that contain nicotine or tobacco, such as cigarettes, e-cigarettes, and chewing tobacco. These can delay healing after injury. If you need help quitting, ask your health care provider.  Keep all follow-up visits as told by your health care provider. This is important. Contact a health care provider if:  You have a fever.  Your chest pain becomes  worse.  You have new symptoms. Get help right away if:  You have nausea or vomiting.  You feel sweaty or light-headed.  You have a cough with mucus from your lungs (  sputum) or you cough up blood.  You develop shortness of breath. These symptoms may represent a serious problem that is an emergency. Do not wait to see if the symptoms will go away. Get medical help right away. Call your local emergency services (911 in the [place] Do not drive yourself to the hospital. Summary  Chest wall pain is pain in or around the bones and muscles of your chest.  Depending on the cause, it may be treated with ice, rest, medicines, and avoiding activities that cause pain.  Contact a health care provider if you have a fever, worsening chest pain, or new symptoms.  Get help right away if you feel light-headed or you develop shortness of breath. These symptoms may be an emergency. This information is not intended to replace advice given to you by your health care provider. Make sure you discuss any questions you have with your health care provider. Document Revised: 03/06/2018 Document Reviewed: 03/06/2018 Elsevier Patient Education  2020 [employer].

## 2020-04-14 NOTE — Plan of Care (Signed)
  Problem: Education: Goal: Understanding of medication regimen will improve Outcome: Adequate for Discharge   Problem: Cardiac: Goal: Ability to achieve and maintain adequate cardiopulmonary perfusion will improve Outcome: Adequate for Discharge   Problem: Health Behavior/Discharge Planning: Goal: Ability to safely manage health-related needs after discharge will improve Outcome: Adequate for Discharge   Problem: Health Behavior/Discharge Planning: Goal: Ability to manage health-related needs will improve Outcome: Adequate for Discharge   Problem: Clinical Measurements: Goal: Ability to maintain clinical measurements within normal limits will improve Outcome: Adequate for Discharge Goal: Will remain free from infection Outcome: Adequate for Discharge Goal: Diagnostic test results will improve Outcome: Adequate for Discharge Goal: Respiratory complications will improve Outcome: Adequate for Discharge Goal: Cardiovascular complication will be avoided Outcome: Adequate for Discharge   Problem: Activity: Goal: Risk for activity intolerance will decrease Outcome: Adequate for Discharge   Problem: Nutrition: Goal: Adequate nutrition will be maintained Outcome: Adequate for Discharge   Problem: Coping: Goal: Level of anxiety will decrease Outcome: Adequate for Discharge   Problem: Elimination: Goal: Will not experience complications related to bowel motility Outcome: Adequate for Discharge Goal: Will not experience complications related to urinary retention Outcome: Adequate for Discharge   Problem: Pain Managment: Goal: General experience of comfort will improve Outcome: Adequate for Discharge   Problem: Safety: Goal: Ability to remain free from injury will improve Outcome: Adequate for Discharge   Problem: Skin Integrity: Goal: Risk for impaired skin integrity will decrease Outcome: Adequate for Discharge

## 2020-04-14 NOTE — Discharge Summary (Signed)
Physician Discharge Summary  [name] ERX:[id number] DOB: 28-Feb-1956 DOA: 04/12/2020  PCP: Patient, No Pcp Per  Admit date: 04/12/2020 Discharge date: 04/14/2020  Admitted From: home Discharge disposition:home   Recommendations for Outpatient Follow-Up:   1. Referral to a fib clinic 2. Patient to establish with PCP (saw a NP In the past)   Discharge Diagnosis:   Principal Problem:   Atrial fibrillation with tachycardic ventricular rate (HCC) Active Problems:   Aneurysm of thoracic aorta (HCC)   History of rheumatic fever    Discharge Condition: Improved.  Diet recommendation: Low sodium, heart healthy.  Wound care: None.  Code status: Full.   History of Present Illness:   Johnny [name] is a 64 y.o. male with medical history significant of A. fib, thoracic aortic aneurysm, history of rheumatic fever presents to emergency department with mediastinal chest pain and palpitations since 2 days.    Patient tells me that he started having palpitation, lightheadedness, fatigue on Sunday afternoon.  Reports 3-4 out of 10 midsternal chest pain, pressure-like, nonradiating, denies association with shortness of breath, leg swelling, nausea, vomiting/diaphoresis.  No history of headache, blurry vision, seizures, fever, chills, cough, congestion, abdominal pain, urinary or bowel changes.  He was diagnosed with A. fib in 2007 and was on po Cardizem for 8 years.  He underwent cardioversion long time ago.  He is currently not taking any medications at home except as needed antacids for GERD.  He sees cardiology-Dr. Jens Som once in a year due to history of thoracic aortic aneurysm.   He tells me that he is very active, exercise and eats healthy food.  No history of smoking, alcohol, illicit drug use   Hospital Course by Problem:    Atrial fibrillation with rapid ventricular response.    Per cards: -He was started on Eliquis yesterday.  He converted to sinus rhythm on  IV diltiazem.  We also had added low-dose metoprolol. We will cancel the TEE cardioversion.  We will change him to Cardizem CD 240 mg a day, Toprol-XL 25 mg a day.  I will also add propranolol 10 mg to take on an as-needed basis if he has episodes of breakthrough atrial fibrillation.   Thoracic aortic dilatation: Further management as an outpatient with Dr. Jens Som.  Mitral regurgitation: He has mild mitral regurgitation.  No further fever/chills: no sign of infection   Medical Consultants:   cards   Discharge Exam:   Vitals:   04/14/20 0349 04/14/20 0739  BP: 101/70 100/68  Pulse: 77 71  Resp: 19 18  Temp: 98.2 F (36.8 C) 98.2 F (36.8 C)  SpO2: 92% 96%   Vitals:   04/13/20 2325 04/14/20 0349 04/14/20 0502 04/14/20 0739  BP: 103/72 101/70  100/68  Pulse: 80 77  71  Resp: 20 19  18   Temp: 98.9 F (37.2 C) 98.2 F (36.8 C)  98.2 F (36.8 C)  TempSrc: Oral Oral  Oral  SpO2: 93% 92%  96%  Weight:   (!) 102.9 kg   Height:        General exam: Appears calm and comfortable.   The results of significant diagnostics from this hospitalization (including imaging, microbiology, ancillary and laboratory) are listed below for reference.     Procedures and Diagnostic Studies:   DG Chest Port 1 View  Result Date: 04/12/2020 CLINICAL DATA:  Epigastric pain, lightheadedness EXAM: PORTABLE CHEST 1 VIEW COMPARISON:  07/12/2008 FINDINGS: Heart size is within normal limits. Atherosclerotic calcification of  the aortic knob. Increased interstitial markings within the perihilar and bibasilar regions, left greater than right. No pleural effusion or pneumothorax. IMPRESSION: Increased interstitial markings within the perihilar and bibasilar regions, left greater than right. Findings may represent interstitial edema versus atypical/viral infection. Electronically Signed   By: Duanne Guess D.O.   On: 04/12/2020 09:41   ECHOCARDIOGRAM COMPLETE  Result Date: 04/13/2020     ECHOCARDIOGRAM REPORT   Patient Name:   [name] Date of Exam: 04/13/2020 Medical Rec #:  [id number]     Height:       74.0 in Accession #:    [id number]    Weight:       229.5 lb Date of Birth:  [birth year]     BSA:          2.304 m Patient Age:    63 years      BP:           102/77 mmHg Patient Gender: M             HR:           96 bpm. Exam Location:  Inpatient Procedure: 2D Echo Indications:    Atrial Fibrillation  History:        Patient has prior history of Echocardiogram examinations, most                 recent 10/12/2015. Mitral Valve Prolapse.  Sonographer:    Thurman Coyer RDCS (AE) Referring Phys: [id number] Kasandra Knudsen PAHWANI IMPRESSIONS  1. Left ventricular ejection fraction, by estimation, is 60 to 65%. The left ventricle has normal function. The left ventricle has no regional wall motion abnormalities. There is mild concentric left ventricular hypertrophy. Left ventricular diastolic parameters are indeterminate.  2. Right ventricular systolic function is normal. The right ventricular size is normal. There is normal pulmonary artery systolic pressure.  3. Left atrial size was moderately dilated.  4. The mitral valve is normal in structure. Mild mitral valve regurgitation. No evidence of mitral stenosis.  5. The aortic valve is tricuspid. Aortic valve regurgitation is not visualized. No aortic stenosis is present.  6. Aortic dilatation noted. Aneurysm of the aortic root, measuring 48 mm. There is moderate dilatation at the level of the sinuses of Valsalva measuring 46 mm.  7. The inferior vena cava is dilated in size with <50% respiratory variability, suggesting right atrial pressure of 15 mmHg. FINDINGS  Left Ventricle: Left ventricular ejection fraction, by estimation, is 60 to 65%. The left ventricle has normal function. The left ventricle has no regional wall motion abnormalities. The left ventricular internal cavity size was normal in size. There is  mild concentric left ventricular hypertrophy.  Left ventricular diastolic parameters are indeterminate. Right Ventricle: The right ventricular size is normal. No increase in right ventricular wall thickness. Right ventricular systolic function is normal. There is normal pulmonary artery systolic pressure. The tricuspid regurgitant velocity is 1.97 m/s, and  with an assumed right atrial pressure of 15 mmHg, the estimated right ventricular systolic pressure is 30.5 mmHg. Left Atrium: Left atrial size was moderately dilated. Right Atrium: Right atrial size was normal in size. Pericardium: There is no evidence of pericardial effusion. Mitral Valve: The mitral valve is normal in structure. There is mild late systolic prolapse of posterior leaflet of the mitral valve. There is moderate thickening of the mitral valve leaflet(s). Normal mobility of the mitral valve leaflets. Mild mitral valve regurgitation. No evidence of mitral valve stenosis. Tricuspid Valve: The  tricuspid valve is normal in structure. Tricuspid valve regurgitation is trivial. No evidence of tricuspid stenosis. Aortic Valve: The aortic valve is tricuspid. Aortic valve regurgitation is not visualized. No aortic stenosis is present. Pulmonic Valve: The pulmonic valve was normal in structure. Pulmonic valve regurgitation is not visualized. No evidence of pulmonic stenosis. Aorta: Aortic dilatation noted. There is moderate dilatation at the level of the sinuses of Valsalva measuring 46 mm. There is an aneurysm involving the aortic root. The aneurysm measures 48 mm. Venous: The inferior vena cava is dilated in size with less than 50% respiratory variability, suggesting right atrial pressure of 15 mmHg. The inferior vena cava and the hepatic vein show a normal flow pattern. IAS/Shunts: No atrial level shunt detected by color flow Doppler.  LEFT VENTRICLE PLAX 2D LVIDd:         4.40 cm LVIDs:         2.80 cm LV PW:         1.10 cm LV IVS:        1.10 cm LVOT diam:     2.50 cm LVOT Area:     4.91 cm  RIGHT  VENTRICLE TAPSE (M-mode): 2.0 cm LEFT ATRIUM             Index       RIGHT ATRIUM           Index LA diam:        4.10 cm 1.78 cm/m  RA Area:     19.10 cm LA Vol (A2C):   75.1 ml 32.59 ml/m RA Volume:   51.20 ml  22.22 ml/m LA Vol (A4C):   78.6 ml 34.11 ml/m LA Biplane Vol: 78.3 ml 33.98 ml/m   AORTA Ao Root diam: 4.60 cm Ao Asc diam:  4.80 cm MR Peak grad: 82.1 mmHg   TRICUSPID VALVE MR Mean grad: 44.0 mmHg   TR Peak grad:   15.5 mmHg MR Vmax:      453.00 cm/s TR Vmax:        197.00 cm/s MR Vmean:     316.0 cm/s                           SHUNTS                           Systemic Diam: 2.50 cm Chilton Si MD Electronically signed by Chilton Si MD Signature Date/Time: 04/13/2020/5:50:30 PM    Final      Labs:   Basic Metabolic Panel: Recent Labs  Lab 04/12/20 0852 04/13/20 0143  NA 139 137  K 4.2 4.1  CL 103 106  CO2 24 20*  GLUCOSE 118* 105*  BUN 22 13  CREATININE 1.14 1.05  CALCIUM 9.3 8.7*  MG 1.9  --    GFR Estimated Creatinine Clearance: 89.8 mL/min (by C-G formula based on SCr of 1.05 mg/dL). Liver Function Tests: No results for input(s): AST, ALT, ALKPHOS, BILITOT, PROT, ALBUMIN in the last 168 hours. No results for input(s): LIPASE, AMYLASE in the last 168 hours. No results for input(s): AMMONIA in the last 168 hours. Coagulation profile Recent Labs  Lab 04/13/20 1838  INR 1.3*    CBC: Recent Labs  Lab 04/12/20 0852 04/13/20 0143  WBC 9.5 9.0  HGB 16.1 14.7  HCT 48.7 44.6  MCV 92.4 91.2  PLT 220 179   Cardiac Enzymes: No results for input(s): CKTOTAL, CKMB,  CKMBINDEX, TROPONINI in the last 168 hours. BNP: Invalid input(s): POCBNP CBG: No results for input(s): GLUCAP in the last 168 hours. D-Dimer No results for input(s): DDIMER in the last 72 hours. Hgb A1c No results for input(s): HGBA1C in the last 72 hours. Lipid Profile Recent Labs    04/13/20 0143  CHOL 165  HDL 37*  LDLCALC 115*  TRIG 65  CHOLHDL 4.5   Thyroid function  studies Recent Labs    04/12/20 1650  TSH 0.696   Anemia work up No results for input(s): VITAMINB12, FOLATE, FERRITIN, TIBC, IRON, RETICCTPCT in the last 72 hours. Microbiology Recent Results (from the past 240 hour(s))  SARS Coronavirus 2 by RT PCR (hospital order, performed in [facility] hospital lab) Nasopharyngeal Nasopharyngeal Swab     Status: None   Collection Time: 04/12/20 10:07 AM   Specimen: Nasopharyngeal Swab  Result Value Ref Range Status   SARS Coronavirus 2 NEGATIVE NEGATIVE Final    Comment: (NOTE) SARS-CoV-2 target nucleic acids are NOT DETECTED.  The SARS-CoV-2 RNA is generally detectable in upper and lower respiratory specimens during the acute phase of infection. The lowest concentration of SARS-CoV-2 viral copies this assay can detect is 250 copies / mL. A negative result does not preclude SARS-CoV-2 infection and should not be used as the sole basis for treatment or other patient management decisions.  A negative result may occur with improper specimen collection / handling, submission of specimen other than nasopharyngeal swab, presence of viral mutation(s) within the areas targeted by this assay, and inadequate number of viral copies (<250 copies / mL). A negative result must be combined with clinical observations, patient history, and epidemiological information.  Fact Sheet for Patients:   BoilerBrush.com.cy  Fact Sheet for Healthcare Providers: https://pope.com/  This test is not yet approved or  cleared by the [place] FDA and has been authorized for detection and/or diagnosis of SARS-CoV-2 by FDA under an Emergency Use Authorization (EUA).  This EUA will remain in effect (meaning this test can be used) for the duration of the COVID-19 declaration under Section 564(b)(1) of the Act, 21 U.S.C. section 360bbb-3(b)(1), unless the authorization is terminated or revoked sooner.  Performed at [facility], [address] [place], [place] [address]   MRSA PCR Screening     Status: None   Collection Time: 04/12/20  5:00 PM   Specimen: Nasal Mucosa; Nasopharyngeal  Result Value Ref Range Status   MRSA by PCR NEGATIVE NEGATIVE Final    Comment:        The GeneXpert MRSA Assay (FDA approved for NASAL specimens only), is one component of a comprehensive MRSA colonization surveillance program. It is not intended to diagnose MRSA infection nor to guide or monitor treatment for MRSA infections. Performed at [facility], [address], [place], [place] [address]      Discharge Instructions:   Discharge Instructions    Amb referral to AFIB Clinic   Complete by: As directed    Diet - low sodium heart healthy   Complete by: As directed    Increase activity slowly   Complete by: As directed      Allergies as of 04/14/2020      Reactions   Bee Venom Anaphylaxis   Antihistamines, Chlorpheniramine-type Other (See Comments)   Acts as a depressant with pt.      Medication List    TAKE these medications   apixaban 5 MG Tabs tablet Commonly known as:  ELIQUIS Take 1 tablet (5 mg total) by mouth 2 (two) times daily.   diltiazem 240 MG 24 hr capsule Commonly known as: CARDIZEM CD Take 1 capsule (240 mg total) by mouth daily.   metoprolol succinate 25 MG 24 hr tablet Commonly known as: TOPROL-XL Take 1 tablet (25 mg total) by mouth daily.   propranolol 10 MG tablet Commonly known as: INDERAL Take 1 tablet (10 mg total) by mouth 4 (four) times daily as needed (atrial fib).       Follow-up Information    Ronney AstersCleaver, Jesse M, NP Follow up on 05/24/2020.   Specialty: Cardiology Why: Please arrive 15 minutes early for your 8:45am post-hospital follow-up appointment.  Contact information: [address] [place] [id number] [phone]                Time coordinating discharge: 35 min  Signed:  Joseph ArtJessica U Marionna Gonia DO  [facility] 04/14/2020, 9:40 AM

## 2020-05-11 ENCOUNTER — Other Ambulatory Visit: Payer: Self-pay | Admitting: Cardiology

## 2020-05-11 NOTE — Telephone Encounter (Signed)
   *  STAT* If patient is at the pharmacy, call can be transferred to refill team.   1. Which medications need to be refilled? (please list name of each medication and dose if known)   apixaban (ELIQUIS) 5 MG TABS tablet    diltiazem (CARDIZEM CD) 240 MG 24 hr capsule    metoprolol succinate (TOPROL-XL) 25 MG 24 hr tablet    2. Which pharmacy/location (including street and city if local pharmacy) is medication to be sent to? [address] [place], [address], [place], [place] [address]  3. Do they need a 30 day or 90 day supply? 90 days

## 2020-05-12 MED ORDER — APIXABAN 5 MG PO TABS: 5.0000 mg | ORAL_TABLET | Freq: Two times a day (BID) | ORAL | 0 refills | Status: DC

## 2020-05-12 MED ORDER — DILTIAZEM HCL ER COATED BEADS 240 MG PO CP24: 240.0000 mg | ORAL_CAPSULE | Freq: Every day | ORAL | 0 refills | Status: DC

## 2020-05-12 MED ORDER — METOPROLOL SUCCINATE ER 25 MG PO TB24: 25.0000 mg | ORAL_TABLET | Freq: Every day | ORAL | 0 refills | Status: DC

## 2020-05-12 NOTE — Telephone Encounter (Signed)
Rx has been sent to the pharmacy electronically. ° °

## 2020-05-20 ENCOUNTER — Telehealth: Payer: Self-pay | Admitting: Cardiology

## 2020-05-20 NOTE — Telephone Encounter (Signed)
Left message for patient, his last MRI was 08/2019, so he is not due for MR yet. He is to call with any questions.

## 2020-05-20 NOTE — Telephone Encounter (Signed)
° ° °  Pt said he normally get a MRI before seeing Dr. Jens Som, he wanted to ask if he needs it before seeing APP on 09/22

## 2020-05-24 ENCOUNTER — Ambulatory Visit: Payer: Medicare Other | Admitting: General Practice

## 2020-06-08 ENCOUNTER — Ambulatory Visit (INDEPENDENT_AMBULATORY_CARE_PROVIDER_SITE_OTHER): Payer: Medicare Other | Admitting: Cardiology

## 2020-06-08 ENCOUNTER — Other Ambulatory Visit: Payer: Self-pay

## 2020-06-08 VITALS — BP 118/62 | HR 57 | Ht 74.0 in | Wt 238.6 lb

## 2020-06-08 DIAGNOSIS — I712 Thoracic aortic aneurysm, without rupture, unspecified: Secondary | ICD-10-CM

## 2020-06-08 DIAGNOSIS — Z7901 Long term (current) use of anticoagulants: Secondary | ICD-10-CM

## 2020-06-08 DIAGNOSIS — I480 Paroxysmal atrial fibrillation: Secondary | ICD-10-CM

## 2020-06-08 DIAGNOSIS — I340 Nonrheumatic mitral (valve) insufficiency: Secondary | ICD-10-CM

## 2020-06-08 NOTE — Assessment & Plan Note (Signed)
48 mm by echo July 2021- I'll as Dr Jens Som if he wants to get annual MRA in Dec

## 2020-06-08 NOTE — Assessment & Plan Note (Signed)
Recurrent episode 04/14/2020- converted spontaneously to NSR with the addition of beta blocker.

## 2020-06-08 NOTE — Assessment & Plan Note (Signed)
Echo July 2021- H/O RF

## 2020-06-08 NOTE — Assessment & Plan Note (Signed)
Eliquis added July 2021-

## 2020-06-08 NOTE — Patient Instructions (Signed)
Medication Instructions:  No Changes *If you need a refill on your cardiac medications before your next appointment, please call your pharmacy*   Lab Work: No Labs If you have labs (blood work) drawn today and your tests are completely normal, you will receive your results only by: Marland Kitchen MyChart Message (if you have MyChart) OR . A paper copy in the mail If you have any lab test that is abnormal or we need to change your treatment, we will call you to review the results.   Testing/Procedures: No Testing   Follow-Up: At [facility], you and your health needs are our priority.  As part of our continuing mission to provide you with exceptional heart care, we have created designated Provider Care Teams.  These Care Teams include your primary Cardiologist (physician) and Advanced Practice Providers (APPs -  Physician Assistants and Nurse Practitioners) who all work together to provide you with the care you need, when you need it.      Your next appointment:   3 month(s)  The format for your next appointment:   In Person  Provider:   Olga Millers, MD

## 2020-06-08 NOTE — Progress Notes (Signed)
Cardiology Office Note:    Date:  06/08/2020   ID:  [name], DOB 12/21/1955, MRN [id number]  PCP:  Patient, No Pcp Per  Cardiologist:  Johnny Millers, MD  Electrophysiologist:  None   Referring MD: No ref. provider found   No chief complaint on file.   History of Present Illness:    Johnny [name] is a 64 y.o. male with a hx of remote PAF 15 years ago without recurrence until recently, enlarged aortic root, and mild to moderate MR.  Patient had been stable until July 2021 when he presented with atrial fibrillation with RVR.  He was admitted and started on Eliquis and Toprol.  He was set up for a TEE cardioversion the next day but converted spontaneously to normal sinus rhythm sinus bradycardia.  Echocardiogram showed mild MR with an EF of 60%, moderate left atrial enlargement, and an aortic root of 48 mm.  The patient presents to the office today accompanied by his wife who is also a patient of Dr. Ludwig Clarks.  Patient says he is done well, has not had recurrent AF.  He has had no problems with his medications.  He is now on Eliquis 5 mg twice daily, diltiazem 240 mg a day, Toprol 25 mg a day, and has a prescription for Inderal 10 mg up to 4 times a day as needed for recurrent atrial fibrillation.  Past Medical History:  Diagnosis Date  . Atrial fibrillation (HCC)   . Chronic insomnia   . History of rheumatic fever    age 89  . Mitral regurgitation   . Patent foramen ovale   . Thoracic aortic aneurysm [facility])     Past Surgical History:  Procedure Laterality Date  . CARDIOVERSION      Current Medications: Current Meds  Medication Sig  . apixaban (ELIQUIS) 5 MG TABS tablet Take 1 tablet (5 mg total) by mouth 2 (two) times daily.  Marland Kitchen diltiazem (CARDIZEM CD) 240 MG 24 hr capsule Take 1 capsule (240 mg total) by mouth daily.  . metoprolol succinate (TOPROL-XL) 25 MG 24 hr tablet Take 1 tablet (25 mg total) by mouth daily.  . propranolol (INDERAL) 10 MG tablet Take 1 tablet (10  mg total) by mouth 4 (four) times daily as needed (atrial fib).     Allergies:   Bee venom and Antihistamines, chlorpheniramine-type   Social History   Socioeconomic History  . Marital status: Married    Spouse name: Not on file  . Number of children: Not on file  . Years of education: Not on file  . Highest education level: Not on file  Occupational History  . Not on file  Tobacco Use  . Smoking status: Never Smoker  . Smokeless tobacco: Never Used  Substance and Sexual Activity  . Alcohol use: No  . Drug use: No  . Sexual activity: Not on file  Other Topics Concern  . Not on file  Social History Narrative   [employer]   Married   2 daughters ages 52, 30         Social Determinants of Health   Financial Resource Strain:   . Difficulty of Paying Living Expenses: Not on file  Food Insecurity:   . Worried About Programme researcher, broadcasting/film/video in the Last Year: Not on file  . Ran Out of Food in the Last Year: Not on file  Transportation Needs:   . Lack of Transportation (Medical): Not on file  . Lack  of Transportation (Non-Medical): Not on file  Physical Activity:   . Days of Exercise per Week: Not on file  . Minutes of Exercise per Session: Not on file  Stress:   . Feeling of Stress : Not on file  Social Connections:   . Frequency of Communication with Friends and Family: Not on file  . Frequency of Social Gatherings with Friends and Family: Not on file  . Attends Religious Services: Not on file  . Active Member of Clubs or Organizations: Not on file  . Attends Banker Meetings: Not on file  . Marital Status: Not on file     Family History: The patient's family history includes Diverticulosis in his father; Pulmonary fibrosis in his mother.  ROS:   Please see the history of present illness.     All other systems reviewed and are negative.  EKGs/Labs/Other Studies Reviewed:    The following studies were reviewed today: Echo  04/13/2020- IMPRESSIONS    1. Left ventricular ejection fraction, by estimation, is 60 to 65%. The  left ventricle has normal function. The left ventricle has no regional  wall motion abnormalities. There is mild concentric left ventricular  hypertrophy. Left ventricular diastolic  parameters are indeterminate.  2. Right ventricular systolic function is normal. The right ventricular  size is normal. There is normal pulmonary artery systolic pressure.  3. Left atrial size was moderately dilated.  4. The mitral valve is normal in structure. Mild mitral valve  regurgitation. No evidence of mitral stenosis.  5. The aortic valve is tricuspid. Aortic valve regurgitation is not  visualized. No aortic stenosis is present.  6. Aortic dilatation noted. Aneurysm of the aortic root, measuring 48 mm.  There is moderate dilatation at the level of the sinuses of Valsalva  measuring 46 mm.  7. The inferior vena cava is dilated in size with <50% respiratory  variability, suggesting right atrial pressure of 15 mmHg.   EKG:  EKG is  ordered today.  The ekg ordered today demonstrates NSR, SB-HR 57  Recent Labs: 04/12/2020: B Natriuretic Peptide 508.8; Magnesium 1.9; TSH 0.696 04/13/2020: BUN 13; Creatinine, Ser 1.05; Hemoglobin 14.7; Platelets 179; Potassium 4.1; Sodium 137  Recent Lipid Panel    Component Value Date/Time   CHOL 165 04/13/2020 0143   TRIG 65 04/13/2020 0143   HDL 37 (L) 04/13/2020 0143   CHOLHDL 4.5 04/13/2020 0143   VLDL 13 04/13/2020 0143   LDLCALC 115 (H) 04/13/2020 0143    Physical Exam:    VS:  BP 118/62   Pulse (!) 57   Ht 6\' 2"  (1.88 m)   Wt 238 lb 9.6 oz (108.2 kg)   SpO2 95%   BMI 30.63 kg/m     Wt Readings from Last 3 Encounters:  06/08/20 238 lb 9.6 oz (108.2 kg)  04/14/20 (!) 226 lb 13.7 oz (102.9 kg)  06/11/18 232 lb (105.2 kg)     GEN:  Well nourished, well developed in no acute distress HEENT: Normal NECK: No JVD; No carotid bruits CARDIAC:  RRR, no murmurs, rubs, gallops RESPIRATORY:  Clear to auscultation without rales, wheezing or rhonchi  ABDOMEN: Soft, non-tender, non-distended MUSCULOSKELETAL:  No edema; No deformity  SKIN: Warm and dry NEUROLOGIC:  Alert and oriented x 3 PSYCHIATRIC:  Normal affect   ASSESSMENT:    PAF (paroxysmal atrial fibrillation) (HCC) Recurrent episode 04/14/2020- converted spontaneously to NSR with the addition of beta blocker.  Aneurysm of thoracic aorta (HCC) 48 mm by echo July  2021- I'll as Dr Jens Som if he wants to get annual MRA in Dec  Mild mitral insufficiency Echo July 2021- H/O RF  Anticoagulated Eliquis added July 2021-  PLAN:    F/U Dr Jens Som in 3 months.  Continue current medications.    Medication Adjustments/Labs and Tests Ordered: Current medicines are reviewed at length with the patient today.  Concerns regarding medicines are outlined above.  Orders Placed This Encounter  Procedures  . EKG 12-Lead   No orders of the defined types were placed in this encounter.   Patient Instructions  Medication Instructions:  No Changes *If you need a refill on your cardiac medications before your next appointment, please call your pharmacy*   Lab Work: No Labs If you have labs (blood work) drawn today and your tests are completely normal, you will receive your results only by: Marland Kitchen MyChart Message (if you have MyChart) OR . A paper copy in the mail If you have any lab test that is abnormal or we need to change your treatment, we will call you to review the results.   Testing/Procedures: No Testing   Follow-Up: At [facility], you and your health needs are our priority.  As part of our continuing mission to provide you with exceptional heart care, we have created designated Provider Care Teams.  These Care Teams include your primary Cardiologist (physician) and Advanced Practice Providers (APPs -  Physician Assistants and Nurse Practitioners) who all work together to  provide you with the care you need, when you need it.      Your next appointment:   3 month(s)  The format for your next appointment:   In Person  Provider:   Olga Millers, MD       Signed, Corine Shelter, PA-C  06/08/2020 3:17 PM    [facility]

## 2020-06-14 ENCOUNTER — Other Ambulatory Visit: Payer: Self-pay | Admitting: Cardiology

## 2020-06-22 ENCOUNTER — Telehealth: Payer: Self-pay | Admitting: *Deleted

## 2020-06-22 DIAGNOSIS — I712 Thoracic aortic aneurysm, without rupture, unspecified: Secondary | ICD-10-CM

## 2020-06-22 NOTE — Telephone Encounter (Signed)
-----   Message from Abelino Derrick, [place] sent at 06/08/2020  4:08 PM EDT ----- Stanton Kidney can you order this MRA for dilated aortic root to be done in Dec 2021 on Mr Khachatryan and let him know Dr Jens Som reviewed my note and does want it done.   Thanks  Corine Shelter PA-C 06/08/2020 4:10 PM   ----- Message ----- From: Lewayne Bunting, MD Sent: 06/08/2020   3:29 PM EDT To: Abelino Derrick, PA-C  Yes, needs annual MRA; next 12/21. Olga Millers ----- Message ----- From: Lorin Mercy Sent: 06/08/2020   3:19 PM EDT To: Lewayne Bunting, MD  Does he need his annual MRA-for dilated AO root?   Echo was done in July when he came with PAF-48 mm (no change)

## 2020-06-22 NOTE — Telephone Encounter (Signed)
.   Left message for patient of need to recheck his aorta with an MRA in December. Order placed for scheduling in December.

## 2020-07-14 ENCOUNTER — Other Ambulatory Visit: Payer: Self-pay | Admitting: Cardiology

## 2020-07-26 ENCOUNTER — Other Ambulatory Visit: Payer: Self-pay

## 2020-07-27 MED ORDER — METOPROLOL SUCCINATE ER 25 MG PO TB24: 25.0000 mg | ORAL_TABLET | Freq: Every day | ORAL | 3 refills | Status: DC

## 2020-07-27 MED ORDER — PROPRANOLOL HCL 10 MG PO TABS: 10.0000 mg | ORAL_TABLET | Freq: Four times a day (QID) | ORAL | 1 refills | Status: DC | PRN

## 2020-07-27 MED ORDER — DILTIAZEM HCL ER COATED BEADS 240 MG PO CP24: 240.0000 mg | ORAL_CAPSULE | Freq: Every day | ORAL | 3 refills | Status: DC

## 2020-08-16 ENCOUNTER — Encounter: Payer: Self-pay | Admitting: *Deleted

## 2020-08-31 ENCOUNTER — Telehealth: Payer: Self-pay | Admitting: *Deleted

## 2020-08-31 NOTE — Telephone Encounter (Signed)
Left message for pt, December is the month we need to do the MRA to look at the size of the aorta. Please call [phone] to schedule. It looks like they have been trying to reach you in regards to getting that test scheduled. Thank you.

## 2020-09-20 ENCOUNTER — Ambulatory Visit: Payer: Medicare Other | Admitting: Cardiology

## 2020-09-20 NOTE — Progress Notes (Deleted)
HPI: FU atrial fibrillation, thoracic aortic aneurysm and mitral regurgitation. He had a TEE-guided cardioversion on July 13, 2008. His TEE showed normal LV function. He had prolapse of the posterior mitral valve leaflet with moderate mitral regurgitation (2+). He also had a patent foramen ovale. Stress echocardiogram in January of 2011 was normal.Last echocardiogram January 2017 showed normal LV function, moderate left ventricular hypertrophy, dilated aortic root, bileaflet mitral valve prolapse with moderate mitral regurgitation. Abdominal ultrasound January 2017 showed abdominal aortic aneurysm measuring 3.3 cm.  MRA 12/20 showed 4.8 cm dilated aortic root; no AAA.  Echocardiogram 7/21 showed normal LV function, mild LVH, moderate LAE, mild MR, TAA 48 mm. Since he was last seen,  Current Outpatient Medications  Medication Sig Dispense Refill  . diltiazem (CARDIZEM CD) 240 MG 24 hr capsule Take 1 capsule (240 mg total) by mouth daily. 90 capsule 3  . ELIQUIS 5 MG TABS tablet Take 1 tablet by mouth twice daily 180 tablet 1  . metoprolol succinate (TOPROL-XL) 25 MG 24 hr tablet Take 1 tablet (25 mg total) by mouth daily. 90 tablet 3  . propranolol (INDERAL) 10 MG tablet Take 1 tablet (10 mg total) by mouth 4 (four) times daily as needed (atrial fib). 90 tablet 1   No current facility-administered medications for this visit.     Past Medical History:  Diagnosis Date  . Atrial fibrillation (HCC)   . Chronic insomnia   . History of rheumatic fever    age 65  . Mitral regurgitation   . Patent foramen ovale   . Thoracic aortic aneurysm [facility])     Past Surgical History:  Procedure Laterality Date  . CARDIOVERSION      Social History   Socioeconomic History  . Marital status: Married    Spouse name: Not on file  . Number of children: Not on file  . Years of education: Not on file  . Highest education level: Not on file  Occupational History  . Not on file  Tobacco Use   . Smoking status: Never Smoker  . Smokeless tobacco: Never Used  Substance and Sexual Activity  . Alcohol use: No  . Drug use: No  . Sexual activity: Not on file  Other Topics Concern  . Not on file  Social History Narrative   [employer]   Married   2 daughters ages 64, 68         Social Determinants of Health   Financial Resource Strain: Not on file  Food Insecurity: Not on file  Transportation Needs: Not on file  Physical Activity: Not on file  Stress: Not on file  Social Connections: Not on file  Intimate Partner Violence: Not on file    Family History  Problem Relation Age of Onset  . Pulmonary fibrosis Mother   . Diverticulosis Father     ROS: no fevers or chills, productive cough, hemoptysis, dysphasia, odynophagia, melena, hematochezia, dysuria, hematuria, rash, seizure activity, orthopnea, PND, pedal edema, claudication. Remaining systems are negative.  Physical Exam: Well-developed well-nourished in no acute distress.  Skin is warm and dry.  HEENT is normal.  Neck is supple.  Chest is clear to auscultation with normal expansion.  Cardiovascular exam is regular rate and rhythm.  Abdominal exam nontender or distended. No masses palpated. Extremities show no edema. neuro grossly intact  ECG- personally reviewed  A/P  1 TAA-schedule fu MRA to further assess.  2 PAF- pt remains in sinus; chads vasc  1 for age >67. Continue apixaban and beta blocker.  3 MVP with h/o moderate MR-mild prolapse of posterior MV leaflet with mild MR on most recent echo.   4 h/o AAA on prior abdominal ultrsound-not evident on last MRA.  Olga Millers, MD

## 2020-09-28 ENCOUNTER — Telehealth: Payer: Self-pay | Admitting: Cardiology

## 2020-09-28 DIAGNOSIS — I714 Abdominal aortic aneurysm, without rupture, unspecified: Secondary | ICD-10-CM

## 2020-09-28 DIAGNOSIS — R1907 Generalized intra-abdominal and pelvic swelling, mass and lump: Secondary | ICD-10-CM

## 2020-09-28 NOTE — Telephone Encounter (Signed)
Patient wanted to confirm with Stanton Kidney that the test at [facility] 10/22/20 is the right test. Please advise

## 2020-09-28 NOTE — Telephone Encounter (Signed)
Spoke with pt, aware MRA is of the chest and abdomen to check his TAA and AAA.

## 2020-09-29 ENCOUNTER — Telehealth: Payer: Self-pay | Admitting: Cardiology

## 2020-09-29 NOTE — Telephone Encounter (Signed)
Spoke with patient regarding appointment for MRA chest and abdomen schedules Saturday 10/22/20 at 9:00 am at [facility] [address]---arrival time is 8:45 am for check in.  Patient voiced his understanding.

## 2020-10-04 ENCOUNTER — Ambulatory Visit: Payer: Medicare Other | Admitting: Cardiology

## 2020-10-22 ENCOUNTER — Other Ambulatory Visit: Payer: Medicare Other

## 2020-10-22 ENCOUNTER — Inpatient Hospital Stay: Admission: RE | Admit: 2020-10-22 | Payer: Medicare Other | Source: Ambulatory Visit

## 2020-10-25 ENCOUNTER — Other Ambulatory Visit: Payer: Medicare Other

## 2020-10-26 ENCOUNTER — Telehealth: Payer: Self-pay | Admitting: Cardiology

## 2020-10-26 ENCOUNTER — Emergency Department (HOSPITAL_COMMUNITY): Payer: Medicare Other

## 2020-10-26 ENCOUNTER — Inpatient Hospital Stay (HOSPITAL_COMMUNITY)
Admission: EM | Admit: 2020-10-26 | Discharge: 2020-10-29 | DRG: 177 | Disposition: A | Discharge status: Home / Self Care | Payer: Medicare Other | Attending: Family Medicine | Admitting: Family Medicine

## 2020-10-26 ENCOUNTER — Encounter (HOSPITAL_COMMUNITY): Payer: Self-pay

## 2020-10-26 ENCOUNTER — Other Ambulatory Visit: Payer: Self-pay

## 2020-10-26 DIAGNOSIS — H8110 Benign paroxysmal vertigo, unspecified ear: Secondary | ICD-10-CM | POA: Diagnosis not present

## 2020-10-26 DIAGNOSIS — I340 Nonrheumatic mitral (valve) insufficiency: Secondary | ICD-10-CM | POA: Diagnosis not present

## 2020-10-26 DIAGNOSIS — I712 Thoracic aortic aneurysm, without rupture: Secondary | ICD-10-CM | POA: Diagnosis not present

## 2020-10-26 DIAGNOSIS — J9601 Acute respiratory failure with hypoxia: Secondary | ICD-10-CM | POA: Diagnosis present

## 2020-10-26 DIAGNOSIS — J1282 Pneumonia due to coronavirus disease 2019: Secondary | ICD-10-CM | POA: Diagnosis present

## 2020-10-26 DIAGNOSIS — M4854XA Collapsed vertebra, not elsewhere classified, thoracic region, initial encounter for fracture: Secondary | ICD-10-CM | POA: Diagnosis present

## 2020-10-26 DIAGNOSIS — Z23 Encounter for immunization: Secondary | ICD-10-CM

## 2020-10-26 DIAGNOSIS — Z6829 Body mass index (BMI) 29.0-29.9, adult: Secondary | ICD-10-CM

## 2020-10-26 DIAGNOSIS — E669 Obesity, unspecified: Secondary | ICD-10-CM | POA: Diagnosis present

## 2020-10-26 DIAGNOSIS — W19XXXA Unspecified fall, initial encounter: Secondary | ICD-10-CM | POA: Diagnosis present

## 2020-10-26 DIAGNOSIS — U071 COVID-19: Principal | ICD-10-CM

## 2020-10-26 DIAGNOSIS — I951 Orthostatic hypotension: Secondary | ICD-10-CM | POA: Diagnosis not present

## 2020-10-26 DIAGNOSIS — I480 Paroxysmal atrial fibrillation: Secondary | ICD-10-CM | POA: Diagnosis present

## 2020-10-26 DIAGNOSIS — Z7901 Long term (current) use of anticoagulants: Secondary | ICD-10-CM | POA: Diagnosis not present

## 2020-10-26 DIAGNOSIS — R55 Syncope and collapse: Secondary | ICD-10-CM | POA: Diagnosis present

## 2020-10-26 DIAGNOSIS — I081 Rheumatic disorders of both mitral and tricuspid valves: Secondary | ICD-10-CM | POA: Diagnosis not present

## 2020-10-26 DIAGNOSIS — S0101XA Laceration without foreign body of scalp, initial encounter: Secondary | ICD-10-CM | POA: Diagnosis not present

## 2020-10-26 DIAGNOSIS — R412 Retrograde amnesia: Secondary | ICD-10-CM | POA: Diagnosis not present

## 2020-10-26 DIAGNOSIS — S0990XA Unspecified injury of head, initial encounter: Secondary | ICD-10-CM | POA: Diagnosis not present

## 2020-10-26 DIAGNOSIS — Z79899 Other long term (current) drug therapy: Secondary | ICD-10-CM

## 2020-10-26 HISTORY — DX: Unspecified atrial fibrillation: I48.91

## 2020-10-26 LAB — RESP PANEL BY RT-PCR (FLU A&B, COVID) ARPGX2
Influenza A by PCR: NEGATIVE
Influenza B by PCR: NEGATIVE
SARS Coronavirus 2 by RT PCR: POSITIVE — AB

## 2020-10-26 LAB — CBC WITH DIFFERENTIAL/PLATELET
Abs Immature Granulocytes: 0.05 10*3/uL (ref 0.00–0.07)
Basophils Absolute: 0 10*3/uL (ref 0.0–0.1)
Basophils Relative: 0 %
Eosinophils Absolute: 0 10*3/uL (ref 0.0–0.5)
Eosinophils Relative: 0 %
HCT: 47.8 % (ref 39.0–52.0)
Hemoglobin: 16.3 g/dL (ref 13.0–17.0)
Immature Granulocytes: 1 %
Lymphocytes Relative: 19 %
Lymphs Abs: 1 10*3/uL (ref 0.7–4.0)
MCH: 31 pg (ref 26.0–34.0)
MCHC: 34.1 g/dL (ref 30.0–36.0)
MCV: 90.9 fL (ref 80.0–100.0)
Monocytes Absolute: 0.5 10*3/uL (ref 0.1–1.0)
Monocytes Relative: 10 %
Neutro Abs: 3.6 10*3/uL (ref 1.7–7.7)
Neutrophils Relative %: 70 %
Platelets: 151 10*3/uL (ref 150–400)
RBC: 5.26 MIL/uL (ref 4.22–5.81)
RDW: 14.1 % (ref 11.5–15.5)
WBC: 5.1 10*3/uL (ref 4.0–10.5)
nRBC: 0 % (ref 0.0–0.2)

## 2020-10-26 LAB — LACTIC ACID, PLASMA: Lactic Acid, Venous: 1 mmol/L (ref 0.5–1.9)

## 2020-10-26 LAB — D-DIMER, QUANTITATIVE: D-Dimer, Quant: 5.69 ug/mL-FEU — ABNORMAL HIGH (ref 0.00–0.50)

## 2020-10-26 LAB — COMPREHENSIVE METABOLIC PANEL
ALT: 33 U/L (ref 0–44)
AST: 30 U/L (ref 15–41)
Albumin: 3.3 g/dL — ABNORMAL LOW (ref 3.5–5.0)
Alkaline Phosphatase: 65 U/L (ref 38–126)
Anion gap: 12 (ref 5–15)
BUN: 22 mg/dL (ref 8–23)
CO2: 19 mmol/L — ABNORMAL LOW (ref 22–32)
Calcium: 8.6 mg/dL — ABNORMAL LOW (ref 8.9–10.3)
Chloride: 104 mmol/L (ref 98–111)
Creatinine, Ser: 1.26 mg/dL — ABNORMAL HIGH (ref 0.61–1.24)
GFR, Estimated: >60 mL/min (ref >60)
Glucose, Bld: 115 mg/dL — ABNORMAL HIGH (ref 70–99)
Potassium: 4 mmol/L (ref 3.5–5.1)
Sodium: 135 mmol/L (ref 135–145)
Total Bilirubin: 1 mg/dL (ref 0.3–1.2)
Total Protein: 6.1 g/dL — ABNORMAL LOW (ref 6.5–8.1)

## 2020-10-26 LAB — TRIGLYCERIDES: Triglycerides: 66 mg/dL (ref <150)

## 2020-10-26 LAB — LACTATE DEHYDROGENASE: LDH: 189 U/L (ref 98–192)

## 2020-10-26 LAB — PROCALCITONIN: Procalcitonin: <0.1 ng/mL

## 2020-10-26 LAB — HIV ANTIBODY (ROUTINE TESTING W REFLEX): HIV Screen 4th Generation wRfx: NONREACTIVE

## 2020-10-26 LAB — TSH: TSH: 0.645 u[IU]/mL (ref 0.350–4.500)

## 2020-10-26 LAB — FIBRINOGEN: Fibrinogen: 400 mg/dL (ref 210–475)

## 2020-10-26 MED ORDER — DEXAMETHASONE SODIUM PHOSPHATE 10 MG/ML IJ SOLN
INTRAMUSCULAR | Status: AC
  Filled 2020-10-26: qty 1

## 2020-10-26 MED ORDER — DEXAMETHASONE SODIUM PHOSPHATE 10 MG/ML IJ SOLN: 6.0000 mg | Freq: Once | INTRAMUSCULAR | Status: DC

## 2020-10-26 MED ORDER — SODIUM CHLORIDE 0.9 % IV SOLN
200.0000 mg | Freq: Once | INTRAVENOUS | Status: DC
  Filled 2020-10-26: qty 40

## 2020-10-26 MED ORDER — IOHEXOL 350 MG/ML SOLN
50.0000 mL | Freq: Once | INTRAVENOUS | Status: AC | PRN
  Administered 2020-10-26: 50 mL via INTRAVENOUS

## 2020-10-26 MED ORDER — ACETAMINOPHEN 325 MG PO TABS
650.0000 mg | ORAL_TABLET | Freq: Four times a day (QID) | ORAL | Status: DC | PRN
  Administered 2020-10-27 – 2020-10-29 (×10): 650 mg via ORAL
  Filled 2020-10-26 (×11): qty 2

## 2020-10-26 MED ORDER — TETANUS-DIPHTH-ACELL PERTUSSIS 5-2.5-18.5 LF-MCG/0.5 IM SUSY
0.5000 mL | PREFILLED_SYRINGE | Freq: Once | INTRAMUSCULAR | Status: AC
  Administered 2020-10-26: 0.5 mL via INTRAMUSCULAR
  Filled 2020-10-26: qty 0.5

## 2020-10-26 MED ORDER — APIXABAN 5 MG PO TABS
5.0000 mg | ORAL_TABLET | Freq: Two times a day (BID) | ORAL | Status: DC
  Administered 2020-10-26 – 2020-10-29 (×5): 5 mg via ORAL
  Filled 2020-10-26 (×5): qty 1

## 2020-10-26 MED ORDER — LIDOCAINE-EPINEPHRINE 1 %-1:100000 IJ SOLN
20.0000 mL | Freq: Once | INTRAMUSCULAR | Status: AC
  Administered 2020-10-26: 20 mL
  Filled 2020-10-26: qty 1

## 2020-10-26 MED ORDER — SODIUM CHLORIDE 0.9 % IV SOLN
1000.0000 mL | INTRAVENOUS | Status: DC
  Administered 2020-10-26 – 2020-10-28 (×5): 1000 mL via INTRAVENOUS

## 2020-10-26 MED ORDER — ACETAMINOPHEN 650 MG RE SUPP: 650.0000 mg | Freq: Four times a day (QID) | RECTAL | Status: DC | PRN

## 2020-10-26 MED ORDER — ONDANSETRON HCL 4 MG PO TABS
4.0000 mg | ORAL_TABLET | Freq: Four times a day (QID) | ORAL | Status: DC | PRN
  Administered 2020-10-28 – 2020-10-29 (×7): 4 mg via ORAL
  Filled 2020-10-26 (×8): qty 1

## 2020-10-26 MED ORDER — PROPRANOLOL HCL 10 MG PO TABS
10.0000 mg | ORAL_TABLET | Freq: Every day | ORAL | Status: DC
  Administered 2020-10-26: 10 mg via ORAL
  Filled 2020-10-26 (×2): qty 1

## 2020-10-26 MED ORDER — SODIUM CHLORIDE 0.9 % IV SOLN: 100.0000 mg | Freq: Every day | INTRAVENOUS | Status: DC

## 2020-10-26 MED ORDER — DILTIAZEM HCL ER COATED BEADS 240 MG PO CP24
240.0000 mg | ORAL_CAPSULE | Freq: Every day | ORAL | Status: DC
  Administered 2020-10-26 – 2020-10-27 (×2): 240 mg via ORAL
  Filled 2020-10-26 (×2): qty 1

## 2020-10-26 MED ORDER — ONDANSETRON HCL 4 MG/2ML IJ SOLN
4.0000 mg | Freq: Four times a day (QID) | INTRAMUSCULAR | Status: DC | PRN
  Administered 2020-10-27 (×2): 4 mg via INTRAVENOUS
  Filled 2020-10-26 (×2): qty 2

## 2020-10-26 NOTE — ED Triage Notes (Signed)
Pt BIB [facility] from home c/o a syncopal episode and fall. Pt tested positive for COVID last Thursday. Pt was at home in the bathroom getting ready when he became lightheaded and dizzy and passed out. Pt fell and hit his head on the bathtub. Pt denies any pain at this time. Per EMS pt was 90% on RA so pt was placed on 4L and given 4mg  of zofran for nausea.

## 2020-10-26 NOTE — ED Notes (Signed)
Patient transported to CT °

## 2020-10-26 NOTE — ED Notes (Signed)
Dinner Tray Ordered @ 1725. 

## 2020-10-26 NOTE — Progress Notes (Signed)
Orthopedic Tech Progress Note Patient Details:  Johnny [name] 02-24-1956 [id number] LEVEL 2 TRAUMA Patient ID: Johnny [name], male   DOB: 1956-06-02, 65 y.o.   MRN: [id number]   [name] 10/26/2020, 11:25 AM

## 2020-10-26 NOTE — Progress Notes (Signed)
CALL PAGER [phone] for any questions or notifications regarding this patient   FMTS Attending Daily Note: Johnny Levy MD  Attending pager:[phone]  office [phone]  I  have seen and examined this patient, reviewed their chart. I have discussed this patient with the resident. Full H&P to follow. Briefly, 65 yo male who had apparent syncopal event today preceeded by lightheadedness. Sustained scalp laceration which has been sutured. CT head and CT neck show no acute pathology although some possible compression fracture in the thoracic spine which is date indeterminate is noted, although not totally visualized. On my exam he has no neckor thoracic spine tenderness and has FROm neck. He did have recurrence of dizziness during the resident's exam but had no nystagmus at that time. Given this, will get carotid dopplers in addition to ECHO and full syncopal work up. He is currently stable.

## 2020-10-26 NOTE — Hospital Course (Addendum)
Johnny [name] is a 65 y.o. male presenting with syncopal episode. PMH is significant for paroxysmal atrial fibrillation.    Syncope  Fall  Head Laceration Requiring Repair Patient was shaving his face 2/9 when he experienced vision going in and out and proceeded to sit on the toilet to steady himself. He apparently loss consciousness and hit his head on the bathtub causing a 5cm laceration requiring 9 stitches. Wife reports no convulsions, did not lose bowel or urine control. Work up included ECG, CTA of the chest, CT of head and cervical spine, CXR, orthostatic vital signs, echocardiogram, carotid ultrasound, ethanol level, urine drug screen, urinalysis, TSH, and continuous cardiac monitoring which revealed positive orthostatic vital signs, EF 60-65%, known MVP with mitral regurg with flail posterior leaflet of mitral valve  PT/OT recommended vestibular rehab due to positive [place]. Discharged home with Zofran, Eliquis, and Metoprolol.  COVID-19+, stable Patient diagnosed with COVID 2/3, also the day of symptom onset. Patient is unvaccinated. Received azithromycin, dexamethasone, folate, hydroxyurea, and tessalon pearls on 2/5, completed 2/7. Inpatient treatment for COVID was not initiated. Vital signs remained stable throughout admission except for two fevers (Tmax 101.21F) and patient did not require supplemental oxygen.   Atrial Fibrillation, stable Patient reports diagnosed 10 years ago, but intermittently symptomatic. Started Eliquis one year ago. Admission EKG showed sinus rhythm. Remained in sinus rhythm throughout admission without symptoms. Discuss rate control medications due to syncopal episode.   Thoracic Aortic Aneurysm, stable Follows with cardiology since diagnosis with routine imaging.

## 2020-10-26 NOTE — H&P (Signed)
Family Medicine Teaching [facility] Admission History and Physical Service Pager: [phone]  Patient name: Johnny [name] Medical record number: [id number] Date of birth: Nov 21, 1955 Age: 65 y.o. Gender: male  Primary Care Provider: Pcp, No Consultants: None Code Status: Full  Preferred Emergency Contact: Clennon Nasca: [phone]   Chief Complaint: Syncope with posterior head laceration   Assessment and Plan: Johnny [name] is a 65 y.o. male presenting with syncopal episode. PMH is significant for paroxysmal atrial fibrillation.   Syncope  Fall  Head Laceration Requiring Repair Patient was shaving his face this morning, he experienced vision going in and out and proceeded to sit on the toilet to steady himself. He apparently loss consciousness and hit his head on the bathtub causing a 5cm laceration. Wife reports no convulsions, did not lose bowel or urine control. Differential diagnosis consists of carotid artery stenosis vs hypersensitivity, post-Covid cardiomyopathy, cardiac arrhythmia including bradycardia and tachycardia, outflow tract obstruction, vasovagal, orthostatic hypotension, BPPV. -Orthostatic vital signs pending -Echocardiogram and carotid ultrasound pending -Repeat EKG tomorrow -Ethanol level, TSH, U/A, and UDS pending -Cardiac monitoring -mIVF: NS at 100cc/hr  -PT/OT recs  COVID-19+ Patient diagnosed with COVID 2/3, also the day of symptom onset. Patient is unvaccinated. Received "5 pill therapy" on 2/5, completed 2/7. Reports illness was mild without shortness of breath, cough, chest pain. Acute illness seemed to be resolving until 2/8 pm when he felt feverish.  CTA of chest 2/9: No evidence of PE. Areas of airspace opacity at multiple sites in the lungs consistent with atypical organism pneumonia. No consolidation. No pleural effusions.CXR 2/9: No active disease. WBC 5.1, procalcitonin <0.10. Vital signs stable, no oxygen requirement, satting 97% on room air  comfortably. Does not appear to be symptomatic from COVID, and treatment is not indicated at this time. -Contact and airborne precautions  -Notify if new oxygen requirement (goal >92%), continuous pulse oximetry -Daily CBC, BMP   Atrial Fibrillation Patient reports diagnosed 10 years prior, but intermittently symptomatic. Started Eliquis one year ago. Admission EKG showed sinus rhythm. PTA medications include Cardizem 240mg  qd, propanolol 10mg  qd.  -Continue PTA Eliquis 5mg  qd (prescribed BID, but patient taking qd), Cardizem, and propanolol -plan to hold propranolol if orthostatic vitals show evidence of orthostatic hypotension.  Thoracic Aortic Aneurysm, stable CTA of the chest 2/9:  "Stable prominence of the aorta with a measured diameter at the sinuses of Valsalva measuring 4.9 cm. Ascending thoracic aortic diameter measures 3.8 x 3.8 cm, essentially stable compared to prior MR. Ascending thoracic aortic aneurysm. Recommend semi-annual imaging followup by CTA or MRA and referral to cardiothoracic surgery if not already obtained" -Follow-up outpatient  FEN/GI: Regular diet, mIVF at 100cc/hour Prophylaxis: PTA Eliquis   Disposition: Admit to FPTS, observation status   History of Present Illness:  Johnny [name] is a 65 y.o. male presenting with syncope. Patient recently diagnosed with COVID 2/3, the day of symptom onset. Patient and wife are unvaccinated. Reports illness was mild without shortness of breath, cough, chest pain. Received "5 pill therapy" on 2/5, completed 2/7. Patient was tolerating PO intake, acute illness seemed to be resolving until 2/8 pm.  Last night, he was feeling feverish and needed a cold washrag on his forehead. When he was shaving his face this morning, he experienced vision going in and out and proceeded to sit on the toilet to steady himself. He apparently loss consciousness and hit his head on the bathtub causing a 5cm laceration requiring staples. Wife was able  to reach patient  within seconds of fall and called EMS. She did not notice any convulsions or tongue biting, and he did not lose control of urine and bowels. When EMS arrived and attempted to raise the patient, he reports he felt a similar sensation as the syncope episode. Patient complained of some neck pain and headache post-fall.  Upon arrival to the ED, vitals were 97.48F, HR 66, RR 18, BP 122/68, and 91% on room air. Blood glucose: 115. Head laceration was repaired with staples, and patient lost consciousness . Head and cervical spine CT showed no acute intracranial abnormalities without evidence of acute fracture of subluxation of the cervical spine. Compression fracture of T1 and potentially T2 are age indeterminate secondary to image artifact. D-dimer elevated to 5.69; chest CT angiogram showed no evidence of PE but showed stable ascending thoracic aortic aneurysm with foci of atherosclerosis noted in addition to airspace opacity at multiple lung sites consistent with atypical pneumonia. EKG showed sinus rhythm.   Patient seen in ED. Reports similar story to me as he did to the ED providers. Denies prior episodes of syncope and denies symptoms preceding the event. Notably, when auscultating his right carotid, patient became lightheaded upon moving his head to the left. Additionally, raising the patient from laying to seated also makes him feel woozy. Denies vertigo, endorses nausea and states its like he is "on a rollercoaster" when asked to describe his lightheadedness.    Review Of Systems: Per HPI with the following additions:  Review of Systems  Constitutional: Positive for fever (subjective).  HENT: Positive for congestion. Negative for sore throat.   Respiratory: Negative for cough and shortness of breath (on exertion and resting).   Cardiovascular: Negative for chest pain, palpitations and leg swelling.  Gastrointestinal: Negative for abdominal pain, constipation, diarrhea, nausea and  vomiting.  Musculoskeletal: Positive for neck pain. Negative for myalgias.  Skin: Negative for rash.  Neurological: Positive for dizziness and loss of consciousness. Negative for speech change, focal weakness and seizures.  All other systems reviewed and are negative.   Patient Active Problem List   Diagnosis Date Noted  . Syncope 10/26/2020    Past Medical History: Past Medical History:  Diagnosis Date  . Atrial fibrillation [facility])     Past Surgical History: History reviewed. No pertinent surgical history.   Social History: Social History   Tobacco Use  . Smoking status: Never Smoker  . Smokeless tobacco: Never Used  Alcohol use: no Lives at home with wife  Please also refer to relevant sections of EMR.   Family History: History reviewed. No pertinent family history.   Allergies and Medications: No Known Allergies No current facility-administered medications on file prior to encounter.   Current Outpatient Medications on File Prior to Encounter  Medication Sig Dispense Refill  . diltiazem (CARDIZEM CD) 240 MG 24 hr capsule Take 240 mg by mouth daily.    Marland Kitchen ELIQUIS 5 MG TABS tablet Take 5 mg by mouth 2 (two) times daily.    . magnesium oxide (MAG-OX) 400 MG tablet Take 400 mg by mouth daily.    . metoprolol succinate (TOPROL-XL) 25 MG 24 hr tablet Take 25 mg by mouth daily.    . propranolol (INDERAL) 10 MG tablet Take 10 mg by mouth daily.    Marland Kitchen VITAMIN D, CHOLECALCIFEROL, PO Take 1 capsule by mouth daily.    Marland Kitchen zinc gluconate 50 MG tablet Take 50 mg by mouth daily.    **Patient reports only taking Eliquis qd **No longer taking  Mag-Ox or metoprolol succinate   Objective: BP (!) 101/57   Pulse 67   Temp 97.9 F (36.6 C) (Oral)   Resp 20   Ht 6\' 2"  (1.88 m)   Wt 104.3 kg   SpO2 92%   BMI 29.53 kg/m   Physical Exam Constitutional:      General: He is not in acute distress.    Appearance: He is obese. He is not toxic-appearing.  HENT:     Head:  Laceration (staples in place) present. No raccoon eyes, Battle's sign or contusion.  Eyes:     Extraocular Movements: Extraocular movements intact.     Conjunctiva/sclera: Conjunctivae normal.     Comments: No nystagmus appreciated.  Neck:     Vascular: No carotid bruit or JVD.  Cardiovascular:     Rate and Rhythm: Normal rate and regular rhythm.     Heart sounds: S1 normal and S2 normal. Murmur heard.   Systolic murmur is present. No S3 or S4 sounds.   Pulmonary:     Effort: Pulmonary effort is normal.     Breath sounds: Normal breath sounds.  Musculoskeletal:     Cervical back: No rigidity.     Right lower leg: No edema.     Left lower leg: No edema.  Skin:    General: Skin is warm and dry.  Neurological:     Mental Status: He is alert and oriented to person, place, and time.     GCS: GCS eye subscore is 4. GCS verbal subscore is 5. GCS motor subscore is 6.  Psychiatric:        Mood and Affect: Mood and affect normal.        Behavior: Behavior is cooperative.     Labs and Imaging: CBC BMET  Recent Labs  Lab 10/26/20 1043  WBC 5.1  HGB 16.3  HCT 47.8  PLT 151   Recent Labs  Lab 10/26/20 1043  NA 135  K 4.0  CL 104  CO2 19*  BUN 22  CREATININE 1.26*  GLUCOSE 115*  CALCIUM 8.6*     EKG: Normal sinus rhythm. No ST-segment elevations. QTc 421.    12/24/20, Medical Student 10/26/2020, 3:39 PM Acting Intern, [facility] FPTS Intern pager: [phone], text pages welcome  FPTS Upper-Level Resident Addendum   I have independently interviewed and examined the patient. I have discussed the above with the original author and agree with their documentation. My edits for correction/addition/clarification are in blue. Please see also any attending notes.    [phone] MD PGY-3, [facility] 10/26/2020 9:38 PM  FPTS Service pager: [phone] (text pages welcome through [facility])

## 2020-10-26 NOTE — ED Provider Notes (Addendum)
[facility] EMERGENCY DEPARTMENT Provider Note   CSN: [id number] Arrival date & time: 10/26/20  1030     History Chief Complaint  Patient presents with  . Fall    Johnny [name] is a 65 y.o. male.  The history is provided by the patient and medical records. No language interpreter was used.  Fall     65 year old male with history of atrial fibrillation currently on Eliquis, recently diagnosed with COVID-19 6 days ago recently finish outpatient Covid treatment brought here via EMS from home for evaluation of a fall and syncopal episode.  Patient report he developed Covid symptoms including congestion and fatigue approximately 6 days ago.  Had a positive home Covid test, did receive a 5 drug 3-day regimen as treatment and felt fine afterward.  He has been eating and drinking fine.  This morning while in the bathroom shaving he felt lightheadedness, sat down but subsequently fell backward and syncopized striking the back of his head against the tub.  His wife immediately came to his aid and EMS was called.  Patient endorsed mild throbbing headache and neck pain from the impact but otherwise denies any significant confusion.  He denies history changes, nausea, vomiting, focal numbness or focal weakness.  He denies chest pain or heart palpitation.  He mention no loss of taste or smell and have been eating and drinking fine.  No active chest pain.  When EMS arrived, patient O2 sats was at 90% on room air, he was given supplemental oxygen.  Patient has not been vaccinated for COVID-19.  He denies any other recent medication changes.  No prior history of PE or DVT.  Patient is not taking his Eliquis as prescribed.  He takes it once daily instead of twice daily.  He has been on Eliquis for approximately 1 year.  Past Medical History:  Diagnosis Date  . Atrial fibrillation (HCC)     There are no problems to display for this patient.   History reviewed. No pertinent surgical  history.     History reviewed. No pertinent family history.  Social History   Tobacco Use  . Smoking status: Never Smoker  . Smokeless tobacco: Never Used    Home Medications Prior to Admission medications   Not on File    Allergies    Patient has no known allergies.  Review of Systems   Review of Systems  All other systems reviewed and are negative.   Physical Exam Updated Vital Signs BP 122/68 (BP Location: Left Arm)   Pulse 66   Temp 97.9 F (36.6 C) (Oral)   Resp 18   Ht 6\' 2"  (1.88 m)   Wt 104.3 kg   SpO2 91%   BMI 29.53 kg/m   Physical Exam Vitals and nursing note reviewed.  Constitutional:      General: He is not in acute distress.    Appearance: He is well-developed and well-nourished. He is obese.     Comments: Obese male laying in bed appears to be in no acute discomfort  HENT:     Head: Normocephalic.     Comments: 5 cm horizontal laceration noted to the occipital scalp not actively bleeding.  No foreign body noted.  Area is tender to palpation without crepitus.  No raccoon's eyes, no battle sign. Eyes:     Extraocular Movements: Extraocular movements intact.     Conjunctiva/sclera: Conjunctivae normal.     Pupils: Pupils are equal, round, and reactive to light.  Cardiovascular:  Rate and Rhythm: Normal rate and regular rhythm.     Pulses: Normal pulses.     Heart sounds: Normal heart sounds.  Pulmonary:     Effort: Pulmonary effort is normal.     Breath sounds: Normal breath sounds. No wheezing, rhonchi or rales.  Abdominal:     Palpations: Abdomen is soft.     Tenderness: There is no abdominal tenderness.     Comments: Easily reducible periumbilical hernia.  Musculoskeletal:     Cervical back: Normal range of motion and neck supple. Tenderness (Mild tenderness with cervical midline spine without crepitus or step-off.) present.  Skin:    General: Skin is warm.     Findings: No rash.  Neurological:     Mental Status: He is alert and  oriented to person, place, and time.     GCS: GCS eye subscore is 4. GCS verbal subscore is 5. GCS motor subscore is 6.     Cranial Nerves: Cranial nerves are intact.     Sensory: Sensation is intact.     Motor: Motor function is intact.  Psychiatric:        Mood and Affect: Mood and affect and mood normal.     ED Results / Procedures / Treatments   Labs (all labs ordered are listed, but only abnormal results are displayed) Labs Reviewed  RESP PANEL BY RT-PCR (FLU A&B, COVID) [id number]  CULTURE, BLOOD (ROUTINE X 2)  CULTURE, BLOOD (ROUTINE X 2)  BASIC METABOLIC PANEL  CBC WITH DIFFERENTIAL/PLATELET  LACTIC ACID, PLASMA  LACTIC ACID, PLASMA  CBC WITH DIFFERENTIAL/PLATELET  COMPREHENSIVE METABOLIC PANEL  D-DIMER, QUANTITATIVE (NOT AT [facility])  PROCALCITONIN  LACTATE DEHYDROGENASE  FERRITIN  TRIGLYCERIDES  FIBRINOGEN  C-REACTIVE PROTEIN  URINALYSIS, COMPLETE (UACMP) WITH MICROSCOPIC  CBG MONITORING, ED    EKG EKG Interpretation  Date/Time:  Wednesday October 26 2020 10:38:41 EST Ventricular Rate:  64 PR Interval:    QRS Duration: 96 QT Interval:  408 QTC Calculation: 421 R Axis:   55 Text Interpretation: Sinus rhythm Abnormal R-wave progression, early transition Borderline T wave abnormalities Minimal ST elevation, anterior leads No previous tracing Confirmed by Gwyneth Sprout ([id number]) on 10/26/2020 2:39:14 PM   Radiology CT Head Wo Contrast  Result Date: 10/26/2020 CLINICAL DATA:  65 year old male with altered mental status following head trauma. Also with history of a blood thinners. EXAM: CT HEAD WITHOUT CONTRAST CT CERVICAL SPINE WITHOUT CONTRAST TECHNIQUE: Multidetector CT imaging of the head and cervical spine was performed following the standard protocol without intravenous contrast. Multiplanar CT image reconstructions of the cervical spine were also generated. COMPARISON:  None FINDINGS: CT HEAD FINDINGS Brain: No evidence of acute infarction, hemorrhage,  hydrocephalus, extra-axial collection or mass lesion/mass effect. Vascular: No hyperdense vessel or unexpected calcification. Skull: Normal. Negative for fracture or focal lesion. Sinuses/Orbits: Visualized paranasal sinuses and orbits are unremarkable. Other: None. CT CERVICAL SPINE FINDINGS Alignment: Normal. Skull base and vertebrae: No acute fracture in the cervical spine. No primary bone lesion or focal pathologic process. Loss of height at the T1 and potentially T2 levels is age indeterminate, not well assessed due to extensive artifact due to beam attenuation about the shoulders. Soft tissues and spinal canal: No prevertebral fluid or swelling. No visible canal hematoma. Disc levels: Degenerative changes greatest at C3-4 with minimal retrolisthesis of C3 on C4 with disc space narrowing and uncovertebral spurring more pronounced on the LEFT than the RIGHT Upper chest: Negative Other: None IMPRESSION: 1. No acute intracranial abnormality. 2. No  evidence of acute fracture or subluxation of the cervical spine, see below with regards to the thoracic spine. 3. Compression fractures at the T1 and potentially T2 levels are age indeterminate, not well assessed due to extensive artifact due to beam attenuation about the shoulders. These are essentially age indeterminate compression fractures in the upper thoracic spine, no gross edema in the area but with limited assessment due to beam attenuation in this location. Dedicated imaging of this area could be considered as clinically warranted. 4. Degenerative changes in the cervical spine greatest at C3-4 as described. Electronically Signed   By: Donzetta Kohut M.D.   On: 10/26/2020 12:44   CT Angio Chest PE W and/or Wo Contrast  Result Date: 10/26/2020 CLINICAL DATA:  Shortness of breath with COVID-19 positive status. Elevated D-dimer study EXAM: CT ANGIOGRAPHY CHEST WITH CONTRAST TECHNIQUE: Multidetector CT imaging of the chest was performed using the standard  protocol during bolus administration of intravenous contrast. Multiplanar CT image reconstructions and MIPs were obtained to evaluate the vascular anatomy. CONTRAST:  64mL OMNIPAQUE IOHEXOL 350 MG/ML SOLN COMPARISON:  Chest radiograph October 26, 2020. MRI chest August 29, 2019 FINDINGS: Cardiovascular: There is no demonstrable pulmonary embolus. The measured diameter at the sinuses of Valsalva is 4.9 cm, stable compared to most recent MR study. Ascending thoracic aortic diameter measures 3.8 x 3.8 cm, essentially stable. No evident dissection. Note that the contrast bolus within the aorta is not felt to be sufficient to confidently assess for potential dissection. Visualized great vessels appear unremarkable. There are foci of aortic atherosclerosis. There is no pericardial effusion or pericardial thickening. Mediastinum/Nodes: Thyroid appears unremarkable. There is no appreciable thoracic adenopathy. No esophageal lesions are evident. Lungs/Pleura: There are scattered foci of patchy airspace opacity throughout the lungs without demonstrable consolidation. No pleural effusions are evident. No pneumothorax. Trachea and major bronchial structures appear patent. Upper Abdomen: Visualized upper abdominal structures appear unremarkable. Musculoskeletal: No blastic or lytic bone lesions. There is a prominent Schmorl's node at the superior aspect of T6. No chest wall lesions evident. Review of the MIP images confirms the above findings. IMPRESSION: 1.  No evident pulmonary embolus. 2. Stable prominence of the aorta with a measured diameter at the sinuses of Valsalva measuring 4.9 cm. Ascending thoracic aortic diameter measures 3.8 x 3.8 cm, essentially stable compared to prior MR. Ascending thoracic aortic aneurysm. Recommend semi-annual imaging followup by CTA or MRA and referral to cardiothoracic surgery if not already obtained. This recommendation follows 2010 ACCF/AHA/AATS/ACR/ASA/SCA/SCAI/SIR/STS/SVM Guidelines for  the Diagnosis and Management of Patients With Thoracic Aortic Disease. Circulation. 2010; 121: X914-N829. Aortic aneurysm NOS (ICD10-I71.9). Foci of aortic atherosclerosis noted. 3. Areas of airspace opacity at multiple sites in the lungs consistent with atypical organism pneumonia. No consolidation. No pleural effusions. 4.  No evident adenopathy. Aortic aneurysm NOS (ICD10-I71.9). Aortic Atherosclerosis (ICD10-I70.0). Electronically Signed   By: Bretta Bang III M.D.   On: 10/26/2020 13:49   CT Cervical Spine Wo Contrast  Result Date: 10/26/2020 CLINICAL DATA:  65 year old male with altered mental status following head trauma. Also with history of a blood thinners. EXAM: CT HEAD WITHOUT CONTRAST CT CERVICAL SPINE WITHOUT CONTRAST TECHNIQUE: Multidetector CT imaging of the head and cervical spine was performed following the standard protocol without intravenous contrast. Multiplanar CT image reconstructions of the cervical spine were also generated. COMPARISON:  None FINDINGS: CT HEAD FINDINGS Brain: No evidence of acute infarction, hemorrhage, hydrocephalus, extra-axial collection or mass lesion/mass effect. Vascular: No hyperdense vessel or unexpected calcification. Skull:  Normal. Negative for fracture or focal lesion. Sinuses/Orbits: Visualized paranasal sinuses and orbits are unremarkable. Other: None. CT CERVICAL SPINE FINDINGS Alignment: Normal. Skull base and vertebrae: No acute fracture in the cervical spine. No primary bone lesion or focal pathologic process. Loss of height at the T1 and potentially T2 levels is age indeterminate, not well assessed due to extensive artifact due to beam attenuation about the shoulders. Soft tissues and spinal canal: No prevertebral fluid or swelling. No visible canal hematoma. Disc levels: Degenerative changes greatest at C3-4 with minimal retrolisthesis of C3 on C4 with disc space narrowing and uncovertebral spurring more pronounced on the LEFT than the RIGHT Upper  chest: Negative Other: None IMPRESSION: 1. No acute intracranial abnormality. 2. No evidence of acute fracture or subluxation of the cervical spine, see below with regards to the thoracic spine. 3. Compression fractures at the T1 and potentially T2 levels are age indeterminate, not well assessed due to extensive artifact due to beam attenuation about the shoulders. These are essentially age indeterminate compression fractures in the upper thoracic spine, no gross edema in the area but with limited assessment due to beam attenuation in this location. Dedicated imaging of this area could be considered as clinically warranted. 4. Degenerative changes in the cervical spine greatest at C3-4 as described. Electronically Signed   By: Donzetta Kohut M.D.   On: 10/26/2020 12:44   DG Chest Port 1 View  Result Date: 10/26/2020 CLINICAL DATA:  COVID-19 positive. EXAM: PORTABLE CHEST 1 VIEW COMPARISON:  April 12, 2020. FINDINGS: The heart size and mediastinal contours are within normal limits. Both lungs are clear. The visualized skeletal structures are unremarkable. IMPRESSION: No active disease. Aortic Atherosclerosis (ICD10-I70.0). Electronically Signed   By: Lupita Raider M.D.   On: 10/26/2020 11:05    Procedures .Marland KitchenLaceration Repair  Date/Time: 10/26/2020 1:53 PM Performed by: Fayrene Helper, PA-C Authorized by: Fayrene Helper, PA-C   Consent:    Consent obtained:  Verbal   Consent given by:  Patient   Risks discussed:  Infection, need for additional repair, pain, poor cosmetic result and poor wound healing   Alternatives discussed:  No treatment and delayed treatment Universal protocol:    Procedure explained and questions answered to patient or proxy's satisfaction: yes     Relevant documents present and verified: yes     Test results available: yes     Imaging studies available: yes     Required blood products, implants, devices, and special equipment available: yes     Site/side marked: yes      Immediately prior to procedure, a time out was called: yes     Patient identity confirmed:  Verbally with patient Anesthesia:    Anesthesia method:  Local infiltration   Local anesthetic:  Lidocaine 1% WITH epi Laceration details:    Location:  Scalp   Scalp location:  Occipital   Length (cm):  6   Depth (mm):  4 Pre-procedure details:    Preparation:  Patient was prepped and draped in usual sterile fashion and imaging obtained to evaluate for foreign bodies Exploration:    Limited defect created (wound extended): no     Imaging outcome: foreign body not noted     Wound exploration: wound explored through full range of motion and entire depth of wound visualized     Contaminated: no   Treatment:    Area cleansed with:  Povidone-iodine   Amount of cleaning:  Standard   Irrigation solution:  Sterile saline  Irrigation method:  Pressure wash   Visualized foreign bodies/material removed: no     Debridement:  None   Undermining:  Minimal Skin repair:    Repair method:  Staples   Number of staples:  9 Approximation:    Approximation:  Close Repair type:    Repair type:  Intermediate Post-procedure details:    Dressing:  Open (no dressing)   Patient tolerance of procedure: Shortly after patient received wound care which includes surgical stable he had a transient syncopal episode likely vasovagal.  .Critical Care Performed by: Fayrene Helper, PA-C Authorized by: Fayrene Helper, PA-C   Critical care provider statement:    Critical care time (minutes):  47   Critical care was time spent personally by me on the following activities:  Discussions with consultants, evaluation of patient's response to treatment, examination of patient, ordering and performing treatments and interventions, ordering and review of laboratory studies, ordering and review of radiographic studies, pulse oximetry, re-evaluation of patient's condition, obtaining history from patient or surrogate and review of old  charts     Medications Ordered in ED Medications  0.9 %  sodium chloride infusion (1,000 mLs Intravenous New Bag/Given 10/26/20 1303)  dexamethasone (DECADRON) injection 6 mg (has no administration in time range)  remdesivir 200 mg in sodium chloride 0.9% 250 mL IVPB (has no administration in time range)    Followed by  remdesivir 100 mg in sodium chloride 0.9 % 100 mL IVPB (has no administration in time range)  Tdap (BOOSTRIX) injection 0.5 mL (0.5 mLs Intramuscular Given 10/26/20 1301)  lidocaine-EPINEPHrine (XYLOCAINE W/EPI) 1 %-1:100000 (with pres) injection 20 mL (20 mLs Infiltration Given 10/26/20 1301)  iohexol (OMNIPAQUE) 350 MG/ML injection 50 mL (50 mLs Intravenous Contrast Given 10/26/20 1337)    ED Course  I have reviewed the triage vital signs and the nursing notes.  Pertinent labs & imaging results that were available during my care of the patient were reviewed by me and considered in my medical decision making (see chart for details).    MDM Rules/Calculators/A&P                          BP 116/77   Pulse 68   Temp 97.9 F (36.6 C) (Oral)   Resp 20   Ht 6\' 2"  (1.88 m)   Wt 104.3 kg   SpO2 92%   BMI 29.53 kg/m   Final Clinical Impression(s) / ED Diagnoses Final diagnoses:  Acute hypoxemic respiratory failure due to COVID-19 [facility])  Syncope, unspecified syncope type  Scalp laceration, initial encounter    Rx / DC Orders ED Discharge Orders    None     10:55 AM Patient recently test positive for COVID-19 approximately 6 days ago, of finish a full course of antiviral medication who presents today with a syncopal episode while he was in the bathroom shaving.  He did also have history of atrial fibrillation currently on Eliquis.  Level 2 trauma initially activated.  On exam he is alert and oriented x4, without any focal neuro deficit.  Able to provide appropriate history.  Does have a laceration to his occiput amenable for laceration repair.  Will also obtain head and  cervical spine CT along with chest x-ray.  Labs ordered.  EKG shows atrial fibrillation without acute ischemic changes no RVR.  On room air he is satting at 90%.  Will provide supplemental oxygen.  1:03 PM CT scan of the head without any acute  intracranial abnormalities.  No evidence of acute fracture or subluxation of the cervical spine.  Compression fracture of T1 and potential T2 or age indeterminant due to extensive artifacts.  Fortunately patient does not have any significant tenderness to palpation in these area.  Labs remarkable for an elevated D-dimer of 5.69.  In the setting of hypoxia, syncope, recent Covid infection, will obtain chest CT angiogram to rule out PE.  Creatinine is 1.26 however his GFR is greater than 60.  1:55 PM I performed a laceration repair to the occipital scalp with surgical staple.  After the procedure patient had a transient syncopal episode likely vasovagal.  2:50 PM Appreciate consultation from family medicine resident who agrees to see admit patient for further management of his syncope, Covid infection with hypoxia requiring supplemental oxygen.  Will order remdesivir and Decadron.  Care discussed with Dr. Winfield RastPlunkett  Johnny [name] was evaluated in Emergency Department on 10/26/2020 for the symptoms described in the history of present illness. He was evaluated in the context of the global COVID-19 pandemic, which necessitated consideration that the patient might be at risk for infection with the SARS-CoV-2 virus that causes COVID-19. Institutional protocols and algorithms that pertain to the evaluation of patients at risk for COVID-19 are in a state of rapid change based on information released by regulatory bodies including the CDC and federal and state organizations. These policies and algorithms were followed during the patient's care in the ED.    Fayrene Helperran, Marcy Sookdeo, PA-C 10/26/20 1452    Fayrene Helperran, Tanyah Debruyne, PA-C 10/26/20 1455    Gwyneth SproutPlunkett, [place], MD 10/31/20  [phone]

## 2020-10-26 NOTE — Telephone Encounter (Signed)
New Message:    Daughter is calling to let Dr Jens Som pt is being taken by EMS to the ER. She is not sure which ER they are taking him at this time.

## 2020-10-27 ENCOUNTER — Encounter (HOSPITAL_COMMUNITY): Payer: Self-pay | Admitting: Family Medicine

## 2020-10-27 ENCOUNTER — Observation Stay (HOSPITAL_COMMUNITY): Payer: Medicare Other

## 2020-10-27 ENCOUNTER — Inpatient Hospital Stay (HOSPITAL_COMMUNITY): Payer: Medicare Other

## 2020-10-27 DIAGNOSIS — I712 Thoracic aortic aneurysm, without rupture: Secondary | ICD-10-CM | POA: Diagnosis present

## 2020-10-27 DIAGNOSIS — I340 Nonrheumatic mitral (valve) insufficiency: Secondary | ICD-10-CM | POA: Diagnosis not present

## 2020-10-27 DIAGNOSIS — I951 Orthostatic hypotension: Secondary | ICD-10-CM | POA: Diagnosis present

## 2020-10-27 DIAGNOSIS — M4854XA Collapsed vertebra, not elsewhere classified, thoracic region, initial encounter for fracture: Secondary | ICD-10-CM | POA: Diagnosis present

## 2020-10-27 DIAGNOSIS — I480 Paroxysmal atrial fibrillation: Secondary | ICD-10-CM | POA: Diagnosis present

## 2020-10-27 DIAGNOSIS — H8110 Benign paroxysmal vertigo, unspecified ear: Secondary | ICD-10-CM | POA: Diagnosis present

## 2020-10-27 DIAGNOSIS — Z79899 Other long term (current) drug therapy: Secondary | ICD-10-CM | POA: Diagnosis not present

## 2020-10-27 DIAGNOSIS — Z7901 Long term (current) use of anticoagulants: Secondary | ICD-10-CM | POA: Diagnosis not present

## 2020-10-27 DIAGNOSIS — I081 Rheumatic disorders of both mitral and tricuspid valves: Secondary | ICD-10-CM | POA: Diagnosis present

## 2020-10-27 DIAGNOSIS — E669 Obesity, unspecified: Secondary | ICD-10-CM | POA: Diagnosis present

## 2020-10-27 DIAGNOSIS — R55 Syncope and collapse: Secondary | ICD-10-CM

## 2020-10-27 DIAGNOSIS — J9601 Acute respiratory failure with hypoxia: Secondary | ICD-10-CM | POA: Diagnosis present

## 2020-10-27 DIAGNOSIS — J1282 Pneumonia due to coronavirus disease 2019: Secondary | ICD-10-CM | POA: Diagnosis present

## 2020-10-27 DIAGNOSIS — U071 COVID-19: Secondary | ICD-10-CM | POA: Diagnosis present

## 2020-10-27 DIAGNOSIS — S0101XA Laceration without foreign body of scalp, initial encounter: Secondary | ICD-10-CM

## 2020-10-27 DIAGNOSIS — Z23 Encounter for immunization: Secondary | ICD-10-CM | POA: Diagnosis present

## 2020-10-27 DIAGNOSIS — W19XXXA Unspecified fall, initial encounter: Secondary | ICD-10-CM | POA: Diagnosis present

## 2020-10-27 DIAGNOSIS — Z6829 Body mass index (BMI) 29.0-29.9, adult: Secondary | ICD-10-CM | POA: Diagnosis not present

## 2020-10-27 LAB — URINALYSIS, COMPLETE (UACMP) WITH MICROSCOPIC
Bacteria, UA: NONE SEEN
Bilirubin Urine: NEGATIVE
Glucose, UA: NEGATIVE mg/dL
Hgb urine dipstick: NEGATIVE
Ketones, ur: NEGATIVE mg/dL
Leukocytes,Ua: NEGATIVE
Nitrite: NEGATIVE
Protein, ur: NEGATIVE mg/dL
Specific Gravity, Urine: 1.015 (ref 1.005–1.030)
pH: 6.5 (ref 5.0–8.0)

## 2020-10-27 LAB — BASIC METABOLIC PANEL
Anion gap: 11 (ref 5–15)
BUN: 17 mg/dL (ref 8–23)
CO2: 22 mmol/L (ref 22–32)
Calcium: 8.4 mg/dL — ABNORMAL LOW (ref 8.9–10.3)
Chloride: 103 mmol/L (ref 98–111)
Creatinine, Ser: 1.09 mg/dL (ref 0.61–1.24)
GFR, Estimated: >60 mL/min (ref >60)
Glucose, Bld: 88 mg/dL (ref 70–99)
Potassium: 4.2 mmol/L (ref 3.5–5.1)
Sodium: 136 mmol/L (ref 135–145)

## 2020-10-27 LAB — URINALYSIS, ROUTINE W REFLEX MICROSCOPIC
Bilirubin Urine: NEGATIVE
Glucose, UA: NEGATIVE mg/dL
Hgb urine dipstick: NEGATIVE
Ketones, ur: 5 mg/dL — AB
Leukocytes,Ua: NEGATIVE
Nitrite: NEGATIVE
Protein, ur: NEGATIVE mg/dL
Specific Gravity, Urine: 1.019 (ref 1.005–1.030)
pH: 6 (ref 5.0–8.0)

## 2020-10-27 LAB — CBC
HCT: 48.4 % (ref 39.0–52.0)
Hemoglobin: 16.2 g/dL (ref 13.0–17.0)
MCH: 30.9 pg (ref 26.0–34.0)
MCHC: 33.5 g/dL (ref 30.0–36.0)
MCV: 92.2 fL (ref 80.0–100.0)
Platelets: 142 10*3/uL — ABNORMAL LOW (ref 150–400)
RBC: 5.25 MIL/uL (ref 4.22–5.81)
RDW: 14.1 % (ref 11.5–15.5)
WBC: 5 10*3/uL (ref 4.0–10.5)
nRBC: 0 % (ref 0.0–0.2)

## 2020-10-27 LAB — ECHOCARDIOGRAM COMPLETE
Area-P 1/2: 3.85 cm2
Height: 74 in
S' Lateral: 3.3 cm
Weight: 3680 oz

## 2020-10-27 LAB — RAPID URINE DRUG SCREEN, HOSP PERFORMED
Amphetamines: NOT DETECTED
Barbiturates: NOT DETECTED
Benzodiazepines: NOT DETECTED
Cocaine: NOT DETECTED
Opiates: NOT DETECTED
Tetrahydrocannabinol: NOT DETECTED

## 2020-10-27 LAB — CBG MONITORING, ED: Glucose-Capillary: 105 mg/dL — ABNORMAL HIGH (ref 70–99)

## 2020-10-27 MED ORDER — ONDANSETRON HCL 4 MG PO TABS
4.0000 mg | ORAL_TABLET | Freq: Once | ORAL | Status: AC
  Administered 2020-10-27: 4 mg via ORAL
  Filled 2020-10-27: qty 1

## 2020-10-27 MED ORDER — METOPROLOL SUCCINATE ER 25 MG PO TB24: 25.0000 mg | ORAL_TABLET | Freq: Every day | ORAL | Status: DC

## 2020-10-27 NOTE — ED Notes (Signed)
Orthostatic positive. MD at bedside. Hold BP meds today. MD to discontinue.

## 2020-10-27 NOTE — Evaluation (Signed)
Physical Therapy Evaluation Patient Details Name: Johnny [name] MRN: [id number] DOB: [birth year] Today's Date: 10/27/2020   History of Present Illness  Johnny [name] is a 65 y.o. male presenting with syncope with laceration on posterior head. Patient recently diagnosed with COVID 2/3, the day of symptom onset. Patient and wife are unvaccinated. Reports illness was mild without shortness of breath, cough, chest pain. Received "5 pill therapy" on 2/5, completed 2/7.  Clinical Impression   Pt admitted with above diagnosis. Pt was able to demonstrate safe gait and transfers - did have supervision of 2 due to orthostatic with nursing earlier and admitted with syncope.  Pt did have a drop in BP after standing 3 mins but was asymptomatic and BP improved with walking.  Pt had + [facility] on R indicating R posterior canal BPPV and performed Epley's Maneuver. Will benefit from PT to address vestibular findings.   Pt currently with functional limitations due to the deficits listed below (see PT Problem List). Pt will benefit from skilled PT to increase their independence and safety with mobility to allow discharge to the venue listed below.       Follow Up Recommendations Outpatient PT (vestibular)    Equipment Recommendations  None recommended by PT    Recommendations for Other Services       Precautions / Restrictions Precautions Precautions: Other (comment);Fall Precaution Comments: syncopal episode Restrictions Weight Bearing Restrictions: No      Mobility  Bed Mobility Overal bed mobility: Needs Assistance Bed Mobility: Supine to Sit;Sit to Supine     Supine to sit: Min guard;HOB elevated Sit to supine: Min guard   General bed mobility comments: min guard for lines, mild dizziness upon sitting,  significant dizziness with going sit to supine- nystagmus present - tested positive for BPPV (see general comments)    Transfers Overall transfer level: Needs assistance Equipment  used: 1 person hand held assist (for safety) Transfers: Sit to/from Stand Sit to Stand: Min guard;+2 safety/equipment;From elevated surface (ED stretcher, admitted for syncope)         General transfer comment: Pt able to stand and sit from ED stretcher with min guard for safety since Pt + for orthostatics this morning with RN staff  Ambulation/Gait Ambulation/Gait assistance: Min guard Gait Distance (Feet): 60 Feet Assistive device: None Gait Pattern/deviations: Step-to pattern     General Gait Details: Steady, no LOB, slow gait due to in ED room and navigating around tight areas  Stairs            Wheelchair Mobility    Modified Rankin (Stroke Patients Only)       Balance Overall balance assessment: Needs assistance   Sitting balance-Leahy Scale: Normal       Standing balance-Leahy Scale: Good                               Pertinent Vitals/Pain Pain Assessment: Faces Faces Pain Scale: Hurts little more Pain Location: back of head (topical from fall) Pain Descriptors / Indicators: Discomfort;Grimacing;Constant;Sore Pain Intervention(s): Limited activity within patient's tolerance;Monitored during session;Repositioned;Other (comment) (created towel roll pillow to allow him to rest without pressure on staples)    Home Living Family/patient expects to be discharged to:: Private residence Living Arrangements: Spouse/significant other Available Help at Discharge: Family Type of Home: House Home Access: Level entry     Home Layout: Multi-level;Bed/bath upstairs;Able to live on main level with bedroom/bathroom Home Equipment: None  Prior Function Level of Independence: Independent         Comments: Completely independent; teaches Sunday school, drives     Hand Dominance        Extremity/Trunk Assessment   Upper Extremity Assessment Upper Extremity Assessment: Overall WFL for tasks assessed    Lower Extremity Assessment Lower  Extremity Assessment: Overall WFL for tasks assessed    Cervical / Trunk Assessment Cervical / Trunk Assessment: Normal  Communication   Communication: No difficulties  Cognition Arousal/Alertness: Awake/alert Behavior During Therapy: WFL for tasks assessed/performed Overall Cognitive Status: Within Functional Limits for tasks assessed                                        General Comments General comments (skin integrity, edema, etc.): SpO2 WFL on RA throughout session   Orthostatic BP Supine: 123/76 Sit: 111/98 Stand 115/86 Stand 3 mins: 94/81 asymptomatic Post walk in standing: 119/71  Vestibular Testing: Eye tracking, head turns, gaze stabilization: denied dizziness, negative nystagmus Pt returned to supine and with severe dizziness and nystagmus Performed [employer]: L: negative R: + symptoms, + nystagmus, + nausea ; Performed Epley's Maneuver for R posterior canal BPPV  Educated on BPPV   Exercises     Assessment/Plan    PT Assessment Patient needs continued PT services  PT Problem List Decreased balance;Other (comment) (dizziness)       PT Treatment Interventions Patient/family education;Therapeutic exercise;Other (comment);Balance training;Functional mobility training;Neuromuscular re-education;Gait training;Stair training;Therapeutic activities (habituation, canalith repositioning)    PT Goals (Current goals can be found in the Care Plan section)  Acute Rehab PT Goals Patient Stated Goal: to get back to independent PT Goal Formulation: With patient Time For Goal Achievement: 11/10/20 Potential to Achieve Goals: Good    Frequency Min 3X/week   Barriers to discharge        Co-evaluation   Reason for Co-Treatment: For patient/therapist safety;To address functional/ADL transfers;Other (comment) (+ for orthostatics) PT goals addressed during session: Mobility/safety with mobility;Balance OT goals addressed during session: ADL's and  self-care       AM-PAC PT "6 Clicks" Mobility  Outcome Measure Help needed turning from your back to your side while in a flat bed without using bedrails?: A Little Help needed moving from lying on your back to sitting on the side of a flat bed without using bedrails?: A Little Help needed moving to and from a bed to a chair (including a wheelchair)?: A Little Help needed standing up from a chair using your arms (e.g., wheelchair or bedside chair)?: A Little Help needed to walk in hospital room?: A Little Help needed climbing 3-5 steps with a railing? : A Little 6 Click Score: 18    End of Session   Activity Tolerance: Patient tolerated treatment well Patient left: in bed;with call bell/phone within reach Nurse Communication: Mobility status PT Visit Diagnosis: BPPV;Dizziness and giddiness (R42) BPPV - Right/Left : Right    Time: 2458-0998 PT Time Calculation (min) (ACUTE ONLY): 46 min   Charges:   PT Evaluation $PT Eval Moderate Complexity: 1 Mod PT Treatments $Canalith Rep Proc: 8-22 mins        Anise Salvo, PT Acute Rehab Services Pager [phone] [facility] [phone]    Rayetta Humphrey 10/27/2020, 1:28 PM

## 2020-10-27 NOTE — Evaluation (Signed)
Occupational Therapy Evaluation Patient Details Name: Johnny [name] MRN: [id number] DOB: 07/14/1956 Today's Date: 10/27/2020    History of Present Illness Johnny [name] is a 65 y.o. male presenting with syncope with laceration on posterior head. Patient recently diagnosed with COVID 2/3, the day of symptom onset. Patient and wife are unvaccinated. Reports illness was mild without shortness of breath, cough, chest pain. Received "5 pill therapy" on 2/5, completed 2/7.   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 Today Pt is min guard for transfers and while BP is improving, continues to be orthostatic (see numbers below) Pt very close to baseline for ADL, able to perform UB and LB ADL (don socks, wash face) this session without physical assist. Pt walking around room unassisted (initially +2 for safety only) with change in position from supine <>sitting Pt experiencing dizziness, nystagmus. PT remained in room to conduct BPPV evaluation (Pt was positive). OT to sign off at this time - do recommend supervision for showering (once cleared by MD with staples). Education complete.   123/76  supine 111/98 sitting 115/86 initial standing 94/81 standing after 3 min 119/71 standing after walking in room    Follow Up Recommendations  No OT follow up;Supervision - Intermittent (supervision for showering)    Equipment Recommendations  None recommended by OT    Recommendations for Other Services PT consult;Other (comment) (vestibular)     Precautions / Restrictions Precautions Precautions: Other (comment);Fall (covid) Precaution Comments: syncopal episode Restrictions Weight Bearing Restrictions: No      Mobility Bed Mobility Overal bed mobility: Needs Assistance Bed Mobility: Supine to Sit;Sit to Supine     Supine to sit: Min guard;HOB elevated Sit to supine: Min guard   General  bed mobility comments: min guard for lines, significant dizziness with going supine to sit - nystagmus present - tested positive for BPPV    Transfers Overall transfer level: Needs assistance Equipment used: 1 person hand held assist (for safety) Transfers: Sit to/from Stand Sit to Stand: Min guard;+2 safety/equipment;From elevated surface (ED stretcher)         General transfer comment: Pt able to stand and sit from ED stretcher with min guard for safety since Pt + for orthostatics this morning with RN staff    Balance Overall balance assessment: Mild deficits observed, not formally tested                                         ADL either performed or assessed with clinical judgement   ADL Overall ADL's : Modified independent                                       General ADL Comments: Pt able to don socks edge of ED stretcher, perform transfers without assist     Vision   Vision Assessment?: Yes Eye Alignment: Within Functional Limits Ocular Range of Motion: Within Functional Limits Alignment/Gaze Preference: Within Defined Limits Tracking/Visual Pursuits: Able to track stimulus in all quads without difficulty Additional Comments: Pt + for BPPV, holding end range of motion was difficult for him - but related to BPPV     Perception     Praxis      Pertinent Vitals/Pain Pain Assessment:  Faces Faces Pain Scale: Hurts little more Pain Location: back of head (topical from fall) Pain Descriptors / Indicators: Discomfort;Grimacing;Constant;Sore Pain Intervention(s): Limited activity within patient's tolerance;Monitored during session;Repositioned;Other (comment) (created towel roll pillow to allow him to rest without pressure on staples)     Hand Dominance     Extremity/Trunk Assessment Upper Extremity Assessment Upper Extremity Assessment: Overall WFL for tasks assessed   Lower Extremity Assessment Lower Extremity Assessment: Defer  to PT evaluation   Cervical / Trunk Assessment Cervical / Trunk Assessment: Normal   Communication Communication Communication: No difficulties   Cognition Arousal/Alertness: Awake/alert Behavior During Therapy: WFL for tasks assessed/performed Overall Cognitive Status: Within Functional Limits for tasks assessed                                     General Comments  SpO2 WFL on RA throughout session    Exercises     Shoulder Instructions      Home Living Family/patient expects to be discharged to:: Private residence Living Arrangements: Spouse/significant other Available Help at Discharge: Family Type of Home: House Home Access: Level entry     Home Layout: Multi-level;Bed/bath upstairs;Able to live on main level with bedroom/bathroom Alternate Level Stairs-Number of Steps: flight   Bathroom Shower/Tub: Tub/shower unit;Walk-in shower (walk-in upstairs)   Bathroom Toilet: Standard     Home Equipment: None          Prior Functioning/Environment Level of Independence: Independent        Comments: Completely independent; teaches Sunday school, drives        OT Problem List: Impaired balance (sitting and/or standing);Decreased knowledge of use of DME or AE;Pain      OT Treatment/Interventions:      OT Goals(Current goals can be found in the care plan section) Acute Rehab OT Goals Patient Stated Goal: to get back to independent OT Goal Formulation: With patient Time For Goal Achievement: 11/10/20 Potential to Achieve Goals: Good  OT Frequency:     Barriers to D/C:            Co-evaluation PT/OT/SLP Co-Evaluation/Treatment: Yes Reason for Co-Treatment: For patient/therapist safety;To address functional/ADL transfers;Other (comment) (+ for orthostatics) PT goals addressed during session: Mobility/safety with mobility;Balance OT goals addressed during session: ADL's and self-care      AM-PAC OT "6 Clicks" Daily Activity     Outcome  Measure Help from another person eating meals?: None Help from another person taking care of personal grooming?: None Help from another person toileting, which includes using toliet, bedpan, or urinal?: None Help from another person bathing (including washing, rinsing, drying)?: A Little (supervision initially) Help from another person to put on and taking off regular upper body clothing?: None Help from another person to put on and taking off regular lower body clothing?: None 6 Click Score: 23   End of Session Equipment Utilized During Treatment: Gait belt Nurse Communication: Mobility status  Activity Tolerance: Patient tolerated treatment well Patient left: Other (comment) (with PT doing BPPV assessement)  OT Visit Diagnosis: History of falling (Z91.81);BPPV BPPV - Right/Left: Right (per PT - OT did not assess)                Time: 6578-4696 OT Time Calculation (min): 36 min Charges:  OT General Charges $OT Visit: 1 Visit OT Evaluation $OT Eval Moderate Complexity: 1 Mod  Nyoka Cowden OTR/L Acute Rehabilitation Services Pager: [phone] Office: [phone]  Vernona Rieger  J Tayveon Lombardo 10/27/2020, 12:42 PM

## 2020-10-27 NOTE — Progress Notes (Signed)
   10/27/20 1822  Assess: MEWS Score  Temp (!) 101.5 F (38.6 C)  BP 110/68  Pulse Rate 78  ECG Heart Rate 78  Resp 19  SpO2 94 %  O2 Device Room Air  Assess: MEWS Score  MEWS Temp 2  MEWS Systolic 0  MEWS Pulse 0  MEWS RR 0  MEWS LOC 0  MEWS Score 2  MEWS Score Color Yellow  Assess: if the MEWS score is Yellow or Red  Were vital signs taken at a resting state? Yes  Focused Assessment Change from prior assessment (see assessment flowsheet)  Early Detection of Sepsis Score *See Row Information* Low  MEWS guidelines implemented *See Row Information* Yes  Treat  MEWS Interventions Escalated (See documentation below)  Pain Scale 0-10  Pain Score 0  Take Vital Signs  Increase Vital Sign Frequency  Yellow: Q 2hr X 2 then Q 4hr X 2, if remains yellow, continue Q 4hrs  Escalate  MEWS: Escalate Yellow: discuss with charge nurse/RN and consider discussing with provider and RRT  Notify: Charge Nurse/RN  Name of Charge Nurse/RN Notified Jarvis Newcomer, RN  Date Charge Nurse/RN Notified 10/27/20  Time Charge Nurse/RN Notified 1828  Notify: Provider  Provider Name/Title Dr Miquel Dunn - attending  Date Provider Notified 10/27/20  Time Provider Notified [phone]  Notification Type Page  Notification Reason Other (Comment) (increased temp)  Response No new orders  Date of Provider Response 10/27/20  Time of Provider Response 1830  Notify: Rapid Response  Name of Rapid Response RN Notified N/A  Document  Progress note created (see row info) Yes

## 2020-10-27 NOTE — Plan of Care (Signed)
°  Problem: Clinical Measurements: Goal: Will remain free from infection Outcome: Progressing   Problem: Activity: Goal: Risk for activity intolerance will decrease Outcome: Progressing   Problem: Nutrition: Goal: Adequate nutrition will be maintained Outcome: Progressing   Problem: Safety: Goal: Ability to remain free from injury will improve Outcome: Progressing   Problem: Respiratory: Goal: Will maintain a patent airway Outcome: Progressing Goal: Complications related to the disease process, condition or treatment will be avoided or minimized Outcome: Progressing

## 2020-10-27 NOTE — Progress Notes (Signed)
VASCULAR LAB    Carotid duplex has been performed.  See CV proc for preliminary results.   Tayllor Breitenstein, RVT 10/27/2020, 4:15 PM

## 2020-10-27 NOTE — Progress Notes (Addendum)
Family Medicine Teaching Service Daily Progress Note Intern Pager: [phone]  Patient name: Johnny [name] Medical record number: [id number] Date of birth: 01-Sep-1956 Age: 64 y.o. Gender: male  Primary Care Provider: Pcp, No Consultants: None Code Status: Full  Pt Overview and Major Events to Date:  Admitted 2/9 for syncope work-up  Assessment and Plan: Johnny [name] is a 65 y.o. male presenting with syncopal episode. PMH is significant for atrial fibrillation s/p cardioversion 2017.  Syncope  Fall  Head Laceration Requiring Repair TSH 0.645, urinalysis negative, UDS negative. Orthostatic vital signs showed orthostatic hypotension, holding home BP meds.  Overnight telemetry showed no significant events, only occasional PVCs and a single 3 beat run of ventricular tachycardia.  He had no additional episodes of syncope overnight.  Due to his history of mitral stenosis, A. fib an unusual finding of presyncope with carotid massage on admission, we will consult cardiology for further evaluation. -Consult cardiology, appreciate recommendations -Echocardiogram and carotid ultrasound pending -Repeat EKG today, pending -Blood cultures x2 pending -Cardiac monitoring -mIVF: NS at 100cc/hr  -PT/OT recs pending  COVID-19+ Patient diagnosed with COVID 2/3, the day of symptom onset. Patient is unvaccinated. Received "5 pill therapy" (azithromycin, dexamethasone, folate, hydroxyurea, and tessalon pearls) on 2/5, completed 2/7. Reports illness was mild, and patient does not appear to be symptomatic from COVID, treatment is not indicated at this time.  Chart review did demonstrate hypoxia from 88-91 overnight.  During my interaction with him this morning, he was able to maintain saturations between 92-99 for the duration of our interview.  I suspect his lower oxygen at night may be related to OSA as opposed to an acute symptom of COVID-19. -Contact and airborne precautions  -Notify if new oxygen  requirement (goal >92%), continuous pulse oximetry -Daily CBC, BMP   Atrial Fibrillation Patient reports diagnosed 10 years prior, but intermittently symptomatic. Started Eliquis one year ago. Admission EKG showed sinus rhythm. PTA medications include Cardizem 240mg  qd, metoprolol 25mg  qd, propranolol 10mg  PRN  -Continue PTA Eliquis 5mg  qd (prescribed BID, but patient taking qd) -Hold metoprolol and cardizem due to be orthostatic positive  Thoracic Aortic Aneurysm, stable CTA of the chest 2/9:  "Stable prominence of the aorta with a measured diameter at the sinuses of Valsalva measuring 4.9 cm. Ascending thoracic aortic diameter measures 3.8 x 3.8 cm, essentially stable compared to prior MR (08/2019). Ascending thoracic aortic aneurysm. Recommend semi-annual imaging followup by CTA or MRA and referral to cardiothoracic surgery if not already obtained" -Follows with cardiology, Dr.  FEN/GI: regular PPx: Home Eliquis 5mg   Disposition: FPTS, observation status  Subjective:  Patient examined in the ED. No acute complaints. Denies nausea, vomiting, chest pain, shortness of breath. States dizziness is improved since yesterday except when he stands. Of note, orthostatic vital signs showed orthostatic hypotension.  Objective: Temp:  [97.9 F (36.6 C)] 97.9 F (36.6 C) (02/09 1034) Pulse Rate:  [60-77] 73 (02/10 0615) Resp:  [15-23] 22 (02/10 0615) BP: (97-133)/(57-80) 114/78 (02/10 0615) SpO2:  [90 %-97 %] 93 % (02/10 0615) Weight:  [104.3 kg] 104.3 kg (02/09 1038)  Physical Exam:  General: 65 y.o. male in NAD, sitting up in bed. Cardio: s1s2 noted, systolic murmur best heard at lower left sternal border, peripheral pulses intact  Lungs: CTAB, no wheezing, no rhonchi, no crackles, no IWOB on room air Abdomen: Soft, non-tender to palpation, non-distended, bowel sounds present Skin: Warm and mildly diaphoretic Extremities: No edema noted   Laboratory: Recent Labs  Lab  10/26/20 1043  WBC 5.1  HGB 16.3  HCT 47.8  PLT 151   Recent Labs  Lab 10/26/20 1043  NA 135  K 4.0  CL 104  CO2 19*  BUN 22  CREATININE 1.26*  CALCIUM 8.6*  PROT 6.1*  BILITOT 1.0  ALKPHOS 65  ALT 33  AST 30  GLUCOSE 115*    Imaging/Diagnostic Tests: CT Head And Chest Wo Contrast 10/26/2020 IMPRESSION:  1. No acute intracranial abnormality.  2. No evidence of acute fracture or subluxation of the cervical spine, see below with regards to the thoracic spine.  3. Compression fractures at the T1 and potentially T2 levels are age indeterminate, not well assessed due to extensive artifact due to beam attenuation about the shoulders. These are essentially age indeterminate compression fractures in the upper thoracic spine, no gross edema in the area but with limited assessment due to beam attenuation in this location. Dedicated imaging of this area could be considered as clinically warranted.  4. Degenerative changes in the cervical spine greatest at C3-4 as described.   CT Angio Chest PE W and/or Wo Contrast 10/26/2020 IMPRESSION: 1.  No evident pulmonary embolus.  2. Stable prominence of the aorta with a measured diameter at the sinuses of Valsalva measuring 4.9 cm. Ascending thoracic aortic diameter measures 3.8 x 3.8 cm, essentially stable compared to prior MR (08/2019). Ascending thoracic aortic aneurysm. Recommend semi-annual imaging followup by CTA or MRA and referral to cardiothoracic surgery if not already obtained. Foci of aortic atherosclerosis noted.  3. Areas of airspace opacity at multiple sites in the lungs consistent with atypical organism pneumonia. No consolidation. No pleural effusions.  4.  No evident adenopathy.  DG Chest Port 1 View 10/26/2020 IMPRESSION: No active disease.   Johnny [name], Medical Student 10/27/2020, 7:05 AM Acting Intern, [facility] FPTS Intern pager: [phone], text pages welcome  FPTS Upper-Level Resident Addendum   I  have independently interviewed and examined the patient. I have discussed the above with the original author and agree with their documentation. My edits for correction/addition/clarification are in blue. Please see also any attending notes.    Johnny Mo MD PGY-1, [facility] 10/27/2020 2:12 PM  FPTS Service pager: [phone] (text pages welcome through [facility])

## 2020-10-27 NOTE — Progress Notes (Addendum)
S) Johnny [name] was assessed at bedside following a documented temperature over 101.  He noted that he was feeling more ill compared to yesterday that he was experiencing a fever.  He had taken Tylenol which has not made a significant difference yet.  He noted that he worked with physical therapy earlier in the day who had a high suspicion for BPPV with otoliths on the right side.  He denied any new episodes of syncope.  O) BP 116/78 (BP Location: Left Arm)    Pulse 76    Temp 99.7 F (37.6 C) (Oral)    Resp 19    Ht 6\' 2"  (1.88 m)    Wt 104.3 kg    SpO2 98%    BMI 29.53 kg/m   General: Lying in bed on his right side when we entered the room.  Low energy.  Awake and alert and able to interact appropriately and follow commands. Cardio: Normal S1 and S2, no S3 or S4. Rhythm is regular.  2/6 systolic murmur.   Pulm: Clear to auscultation bilaterally, no crackles, wheezing, or diminished breath sounds. Normal respiratory effort Abdomen:  Abdomen soft and non-tender.  Extremities: No peripheral edema. Warm/ well perfused.  Strong radial pulses.  Carotid massage was performed without any evidence of syncope or lightheadedness.  A/P) Johnny [name] is a 65 year old gentleman admitted for syncope who was also found to be Covid positive.  Cardiology was consulted based on the presentation of syncope following shaving and a recurrent episode of near syncope during physical exam when pressure was applied to his right carotid artery.  OT notes this may simply be BPPV. -Tylenol for fevers.  Likely Covid related.  We will continue to follow-up with previous infectious work-up (blood cultures) -Follow-up cardiology recommendations -Oxygenating well during our interaction.  No plan to begin remdesivir or Decadron at this time.

## 2020-10-27 NOTE — Progress Notes (Signed)
SATURATION QUALIFICATIONS: (This note is used to comply with regulatory documentation for home oxygen)  Patient Saturations on Room Air at Rest = 95%  Patient Saturations on Room Air while Ambulating = 90%  Patient Saturations on 0 Liters of oxygen while Ambulating = 90%  Please briefly explain why patient needs home oxygen: 

## 2020-10-27 NOTE — Consult Note (Addendum)
Cardiology Consultation:   Patient ID: Johnny [name] MRN: [id number]; DOB: 07-30-1956  Admit date: 10/26/2020 Date of Consult: 10/27/2020  Primary Care Provider: Merryl [name] No [facility] Cardiologist: Johnny Ruths, MD  [facility] Electrophysiologist:  None    Patient Profile:   Johnny [name] is a 65 y.o. male with a history of paroxymal atrial fibrillation on Eliquis, thoracic aortic aneurysm, rheumatic fever as a young child (age 56), and mild to moderate MR who is being seen today for the evaluation of syncope in the setting of COVID at the request of Johnny [name].  History of Present Illness:   Johnny [name] is a 65 year old male with the above history who is followed by Dr. Stanford [name]. Patient has a history of remote paroxysmal atrial fibrillation about 15 years ago without recurrence until 03/2020 when she presented to the ED with shortness of breath and chest pain and was found to be in atrial fibrillation with RVR. He was admitted and started on Toprol-XL and Eliquis. He was set up for a TEE/DCCV the next day but fortunately converted to sinus rhythm spontaneously. Echo during this admission showed LVEF of 60-65% with normal wall motion, mild LVH, moderately dilated left atrium, mild MR, and aneurysm of aortic root measuring 49m as well as moderate dilation at the level of the sinuses of Valsalva measuring 441m  Thoracic aneurysm previously monitored with MRA. Last MRA in 08/2019 showed stable aneurysmal dilatation of the aortic root with maximal transverse diameter of 4.8cm at the sinus of Valsalva and stable mild aneurysmal dilatation of the ascending tubular aorta with maximum diameter of 3.6cm. No AAA noted at that time.  Patient was last seen by Johnny RansomPA-C, in 05/2020 at which time he was doing well with no recurrence of atrial fibrillation. He was tolerating his medications well.   Patient presented to the ED yesterday via EMS after a syncopal episode at home. Patient states he was  in his usual state of health until last Thursday 10/20/2020 when he started having "classic flu symptoms" at work. He reports he had a fever and body aches as well as mild nasal congestion but no cough or shortness of breath. He came home and the next day go tested for COVID and this was positive. He states that those initial symptoms resolved after about 24 hours and then he was feeling much better. Then on Tuesday 10/25/2020, he states he started feeling "chilly" so he "wrapped up." This caused hm to get "overheated" that night. On Wednesday morning, he unwrapped and placed a cool washcloth on his forehead. He then got up and went to the bathroom. He was shaving when he all sudden started "spacing out." He sat down on a stool to get his bearings and then passed out and hit his head on the bathtub and sustained a laceration to the occipital scalp. Patient denies any chest pain, shortness of breath, palpitations, or real lightheadedness/dizziness before this. No orthopnea, PND, or edema. No abnormal bleeding in urine or stools on Eliquis.   Upon arrival to the ED, vitals stable. EKG showed normal sinus rhythm, rate 64 bpm, with concave upsloping of ST segment in anterior leads. No significant changes compared to prior tracings. Chest x-ray showed no acute findings. D-dimer elevated at 5.69. Chest CTA showed negative PE, stable thoracic aortic aneurysm, and areas of airspace opacity at multiple sites in the lungs consistent with atypical organism pneumonia. WBC 5.1, Hgb 16.3, Plts 151. Na 135, K 4.0, Glucose 115,  BUN 22, Cr 1.26. Albumin 3.3, AST 30, ALT 33, Alk Phos 65, Total Bili 1.0.  Head/neck CT showed no acute intracranial abnormalities and compression fractures at T1 and potentially T2 levels that are age indeterminate.   Patient's occipital scalp lacceraate was repaired in the ED with stables. Following this procedure, patient had a transient syncopal episode which was felt to be vasovagal in nature.  Telemetry around this times shows sinus bradycardia with rates in the 40's. He was only in the 40's briefly.  At the time of this evaluation, patient is resting comfortably. O2 sats in the 90's on room air. He had a fever this afternoon of 101.2 which resolved with Tylenol. He has had some nausea today. He also states he feels dizzy when he sits up after hitting his head.  Patient has been seen by OT and is positive for BPPV. Orthostatic vital signs also borderline positive.   Past Medical History:  Diagnosis Date  . Atrial fibrillation ([place])     History reviewed. No pertinent surgical history.   Home Medications:  Prior to Admission medications   Medication Sig Start Date End Date Taking? Authorizing Provider  diltiazem (CARDIZEM CD) 240 MG 24 hr capsule Take 240 mg by mouth daily. 10/23/20  Yes [provider]  ELIQUIS 5 MG TABS tablet Take 5 mg by mouth 2 (two) times daily. 06/18/20  Yes [provider]  metoprolol succinate (TOPROL-XL) 25 MG 24 hr tablet Take 25 mg by mouth daily. 10/23/20  Yes [provider]  propranolol (INDERAL) 10 MG tablet Take 10 mg by mouth daily as needed (rapid HR). 07/27/20  Yes [provider]  VITAMIN D, CHOLECALCIFEROL, PO Take 1 capsule by mouth daily.   Yes [provider]  zinc gluconate 50 MG tablet Take 50 mg by mouth daily.   Yes [provider]    Inpatient Medications: Scheduled Meds: . apixaban  5 mg Oral BID   Continuous Infusions: . sodium chloride 1,000 mL (10/27/20 1245)   PRN Meds: acetaminophen **OR** acetaminophen, ondansetron **OR** ondansetron (ZOFRAN) IV  Allergies:   No Known Allergies  Social History:   Social History   Socioeconomic History  . Marital status: Married    Spouse name: Not on file  . Number of children: Not on file  . Years of education: Not on file  . Highest education level: Not on file  Occupational History  . Not on file  Tobacco Use  . Smoking  status: Never Smoker  . Smokeless tobacco: Never Used  Substance and Sexual Activity  . Alcohol use: Not on file  . Drug use: Not on file  . Sexual activity: Not on file  Other Topics Concern  . Not on file  Social History Narrative  . Not on file   Social Determinants of Health   Financial Resource Strain: Not on file  Food Insecurity: Not on file  Transportation Needs: Not on file  Physical Activity: Not on file  Stress: Not on file  Social Connections: Not on file  Intimate Partner Violence: Not on file    Family History:    Family History  Problem Relation Age of Onset  . Pulmonary fibrosis Mother   . Diverticulosis Father      ROS:  Please see the history of present illness.  All other ROS reviewed and negative.     Physical Exam/Data:   Vitals:   10/27/20 1219 10/27/20 1323 10/27/20 1627 10/27/20 1714  BP: 116/78  109/60  Pulse: 76  68   Resp: 19  20   Temp: (!) 101.2 F (38.4 C) 99.7 F (37.6 C) 98.9 F (37.2 C)   TempSrc: Oral Oral Oral   SpO2: 98%  90% 95%  Weight:      Height:        Intake/Output Summary (Last 24 hours) at 10/27/2020 1723 Last data filed at 10/27/2020 1636 Gross per 24 hour  Intake 120 ml  Output 675 ml  Net -555 ml   Last 3 Weights 10/26/2020  Weight (lbs) 230 lb  Weight (kg) 104.327 kg     Body mass index is 29.53 kg/m.  General: 65 y.o. male resting comfortably in no acute distress. HEENT: Occipital scalp laceration with staples. Sclera clear.  Neck: Supple. No carotid bruits. No JVD. Heart: RRR. Distinct S1 and S2. III/VI systolic murmurs. No gallops or rubs. Radial pulses 2+ and equal bilaterally. Lungs: No increased work of breathing. Clear to ausculation bilaterally. No wheezes, rhonchi, or rales.  Abdomen: Soft, non-distended, and non-tender to palpation.  MSK: Normal strength and tone for age. Extremities: No lower extremity edema.    Skin: Warm and dry. Neuro: Alert and oriented x3. No focal deficits. Psych:  Normal affect. Responds appropriately.   EKG:  The EKG was personally reviewed and demonstrates:  EKG showed normal sinus rhythm, rate 64 bpm, with concave upsloping of ST segment in anterior leads. No significant changes compared to prior tracings Telemetry:  Telemetry was personally reviewed and demonstrates:  Sinus rhythm with rates in the 60's to 80's. Did drop down into the 40's to 50's yesterday afternoon.  Relevant CV Studies:  Echocardiogram 10/27/2020: Impressions: 1. Left ventricular ejection fraction, by estimation, is 60 to 65%. The  left ventricle has normal function. The left ventricle has no regional  wall motion abnormalities. Left ventricular diastolic parameters are  indeterminate.  2. Right ventricular systolic function is normal. The right ventricular  size is normal. There is normal pulmonary artery systolic pressure.  3. Left atrial size was moderately dilated.  4. Flail posterior leaflet of mitral valve, see images 57, 81. The mitral  valve is myxomatous. Moderate mitral valve regurgitation. No evidence of  mitral stenosis. There is severe holosystolic prolapse of posterior  leaflet of the mitral valve.  5. The aortic valve is tricuspid. Aortic valve regurgitation is not  visualized. No aortic stenosis is present.  6. Aortic root/ascending aorta has been repaired/replaced. There is mild  dilatation of the ascending aorta, measuring 38 mm.   Comparison(s): No prior Echocardiogram.   Conclusion(s)/Recommendation(s): Eccentric MR with flail posterior leaflet  of mitral valve. Given angles cannot fully exclude severe MR. Recommend  TEE for further evaluation. As patient is currently Covid positive, would  consider TEE once recovered from  Covid.  _______________  Carotid Ultrasound 10/27/2020: Summary: - Right Carotid: The extracranial vessels were near-normal with only minimal  wall thickening or plaque.  - Left Carotid: The extracranial vessels were  near-normal with only minimal  wall thickening or plaque.  - Vertebrals: Bilateral vertebral arteries demonstrate antegrade flow.  - Subclavians: Normal flow hemodynamics were seen in bilateral subclavian arteries.   Laboratory Data:  High Sensitivity Troponin:  No results for input(s): TROPONINIHS in the last 720 hours.   Chemistry Recent Labs  Lab 10/26/20 1043 10/27/20 0759  NA 135 136  K 4.0 4.2  CL 104 103  CO2 19* 22  GLUCOSE 115* 88  BUN 22 17  CREATININE 1.26* 1.09  CALCIUM 8.6*  8.4*  GFRNONAA >60 >60  ANIONGAP 12 11    Recent Labs  Lab 10/26/20 1043  PROT 6.1*  ALBUMIN 3.3*  AST 30  ALT 33  ALKPHOS 65  BILITOT 1.0   Hematology Recent Labs  Lab 10/26/20 1043 10/27/20 0759  WBC 5.1 5.0  RBC 5.26 5.25  HGB 16.3 16.2  HCT 47.8 48.4  MCV 90.9 92.2  MCH 31.0 30.9  MCHC 34.1 33.5  RDW 14.1 14.1  PLT 151 142*   BNPNo results for input(s): BNP, PROBNP in the last 168 hours.  [place]  Recent Labs  Lab 10/26/20 1043  DDIMER 5.69*     Radiology/Studies:  CT Head Wo Contrast  Result Date: 10/26/2020 CLINICAL DATA:  64 year old male with altered mental status following head trauma. Also with history of a blood thinners. EXAM: CT HEAD WITHOUT CONTRAST CT CERVICAL SPINE WITHOUT CONTRAST TECHNIQUE: Multidetector CT imaging of the head and cervical spine was performed following the standard protocol without intravenous contrast. Multiplanar CT image reconstructions of the cervical spine were also generated. COMPARISON:  None FINDINGS: CT HEAD FINDINGS Brain: No evidence of acute infarction, hemorrhage, hydrocephalus, extra-axial collection or mass lesion/mass effect. Vascular: No hyperdense vessel or unexpected calcification. Skull: Normal. Negative for fracture or focal lesion. Sinuses/Orbits: Visualized paranasal sinuses and orbits are unremarkable. Other: None. CT CERVICAL SPINE FINDINGS Alignment: Normal. Skull base and vertebrae: No acute fracture in the  cervical spine. No primary bone lesion or focal pathologic process. Loss of height at the T1 and potentially T2 levels is age indeterminate, not well assessed due to extensive artifact due to beam attenuation about the shoulders. Soft tissues and spinal canal: No prevertebral fluid or swelling. No visible canal hematoma. Disc levels: Degenerative changes greatest at C3-4 with minimal retrolisthesis of C3 on C4 with disc space narrowing and uncovertebral spurring more pronounced on the LEFT than the RIGHT Upper chest: Negative Other: None IMPRESSION: 1. No acute intracranial abnormality. 2. No evidence of acute fracture or subluxation of the cervical spine, see below with regards to the thoracic spine. 3. Compression fractures at the T1 and potentially T2 levels are age indeterminate, not well assessed due to extensive artifact due to beam attenuation about the shoulders. These are essentially age indeterminate compression fractures in the upper thoracic spine, no gross edema in the area but with limited assessment due to beam attenuation in this location. Dedicated imaging of this area could be considered as clinically warranted. 4. Degenerative changes in the cervical spine greatest at C3-4 as described. Electronically Signed   By: Zetta Bills M.D.   On: 10/26/2020 12:44   CT Angio Chest PE W and/or Wo Contrast  Result Date: 10/26/2020 CLINICAL DATA:  Shortness of breath with COVID-19 positive status. Elevated D-dimer study EXAM: CT ANGIOGRAPHY CHEST WITH CONTRAST TECHNIQUE: Multidetector CT imaging of the chest was performed using the standard protocol during bolus administration of intravenous contrast. Multiplanar CT image reconstructions and MIPs were obtained to evaluate the vascular anatomy. CONTRAST:  39m OMNIPAQUE IOHEXOL 350 MG/ML SOLN COMPARISON:  Chest radiograph October 26, 2020. MRI chest August 29, 2019 FINDINGS: Cardiovascular: There is no demonstrable pulmonary embolus. The measured  diameter at the sinuses of Valsalva is 4.9 cm, stable compared to most recent MR study. Ascending thoracic aortic diameter measures 3.8 x 3.8 cm, essentially stable. No evident dissection. Note that the contrast bolus within the aorta is not felt to be sufficient to confidently assess for potential dissection. Visualized great vessels appear unremarkable. There are  foci of aortic atherosclerosis. There is no pericardial effusion or pericardial thickening. Mediastinum/Nodes: Thyroid appears unremarkable. There is no appreciable thoracic adenopathy. No esophageal lesions are evident. Lungs/Pleura: There are scattered foci of patchy airspace opacity throughout the lungs without demonstrable consolidation. No pleural effusions are evident. No pneumothorax. Trachea and major bronchial structures appear patent. Upper Abdomen: Visualized upper abdominal structures appear unremarkable. Musculoskeletal: No blastic or lytic bone lesions. There is a prominent Schmorl's node at the superior aspect of T6. No chest wall lesions evident. Review of the MIP images confirms the above findings. IMPRESSION: 1.  No evident pulmonary embolus. 2. Stable prominence of the aorta with a measured diameter at the sinuses of Valsalva measuring 4.9 cm. Ascending thoracic aortic diameter measures 3.8 x 3.8 cm, essentially stable compared to prior MR. Ascending thoracic aortic aneurysm. Recommend semi-annual imaging followup by CTA or MRA and referral to cardiothoracic surgery if not already obtained. This recommendation follows 2010 ACCF/AHA/AATS/ACR/ASA/SCA/SCAI/SIR/STS/SVM Guidelines for the Diagnosis and Management of Patients With Thoracic Aortic Disease. Circulation. 2010; 121: J500-X381. Aortic aneurysm NOS (ICD10-I71.9). Foci of aortic atherosclerosis noted. 3. Areas of airspace opacity at multiple sites in the lungs consistent with atypical organism pneumonia. No consolidation. No pleural effusions. 4.  No evident adenopathy. Aortic  aneurysm NOS (ICD10-I71.9). Aortic Atherosclerosis (ICD10-I70.0). Electronically Signed   By: Lowella Grip III M.D.   On: 10/26/2020 13:49   CT Cervical Spine Wo Contrast  Result Date: 10/26/2020 CLINICAL DATA:  65 year old male with altered mental status following head trauma. Also with history of a blood thinners. EXAM: CT HEAD WITHOUT CONTRAST CT CERVICAL SPINE WITHOUT CONTRAST TECHNIQUE: Multidetector CT imaging of the head and cervical spine was performed following the standard protocol without intravenous contrast. Multiplanar CT image reconstructions of the cervical spine were also generated. COMPARISON:  None FINDINGS: CT HEAD FINDINGS Brain: No evidence of acute infarction, hemorrhage, hydrocephalus, extra-axial collection or mass lesion/mass effect. Vascular: No hyperdense vessel or unexpected calcification. Skull: Normal. Negative for fracture or focal lesion. Sinuses/Orbits: Visualized paranasal sinuses and orbits are unremarkable. Other: None. CT CERVICAL SPINE FINDINGS Alignment: Normal. Skull base and vertebrae: No acute fracture in the cervical spine. No primary bone lesion or focal pathologic process. Loss of height at the T1 and potentially T2 levels is age indeterminate, not well assessed due to extensive artifact due to beam attenuation about the shoulders. Soft tissues and spinal canal: No prevertebral fluid or swelling. No visible canal hematoma. Disc levels: Degenerative changes greatest at C3-4 with minimal retrolisthesis of C3 on C4 with disc space narrowing and uncovertebral spurring more pronounced on the LEFT than the RIGHT Upper chest: Negative Other: None IMPRESSION: 1. No acute intracranial abnormality. 2. No evidence of acute fracture or subluxation of the cervical spine, see below with regards to the thoracic spine. 3. Compression fractures at the T1 and potentially T2 levels are age indeterminate, not well assessed due to extensive artifact due to beam attenuation about the  shoulders. These are essentially age indeterminate compression fractures in the upper thoracic spine, no gross edema in the area but with limited assessment due to beam attenuation in this location. Dedicated imaging of this area could be considered as clinically warranted. 4. Degenerative changes in the cervical spine greatest at C3-4 as described. Electronically Signed   By: Zetta Bills M.D.   On: 10/26/2020 12:44   DG Chest Port 1 View  Result Date: 10/26/2020 CLINICAL DATA:  COVID-19 positive. EXAM: PORTABLE CHEST 1 VIEW COMPARISON:  April 12, 2020. FINDINGS:  The heart size and mediastinal contours are within normal limits. Both lungs are clear. The visualized skeletal structures are unremarkable. IMPRESSION: No active disease. Aortic Atherosclerosis (ICD10-I70.0). Electronically Signed   By: Marijo Conception M.D.   On: 10/26/2020 11:05   ECHOCARDIOGRAM COMPLETE  Result Date: 10/27/2020    ECHOCARDIOGRAM REPORT   Patient Name:   NICKOLI BAGHERI Date of Exam: 10/27/2020 Medical Rec #:  [id number]     Height:       74.0 in Accession #:    [id number]    Weight:       230.0 lb Date of Birth:  1956-05-31     BSA:          2.306 m Patient Age:    13 years      BP:           127/75 mmHg Patient Gender: M             HR:           79 bpm. Exam Location:  Inpatient Procedure: 2D Echo, Cardiac Doppler and Color Doppler                       REPORT CONTAINS CRITICAL RESULT  Findings communicated to inpatient attending physician Johnny [name] at 4:02 PM. Indications:    Syncope 780.2 / R55  History:        Patient has no prior history of Echocardiogram examinations.                 Arrythmias:Atrial Fibrillation; Signs/Symptoms:Syncope.  Sonographer:    Bernadene Person RDCS Referring Phys: [place]  1. Left ventricular ejection fraction, by estimation, is 60 to 65%. The left ventricle has normal function. The left ventricle has no regional wall motion abnormalities. Left ventricular diastolic parameters  are indeterminate.  2. Right ventricular systolic function is normal. The right ventricular size is normal. There is normal pulmonary artery systolic pressure.  3. Left atrial size was moderately dilated.  4. Flail posterior leaflet of mitral valve, see images 57, 81. The mitral valve is myxomatous. Moderate mitral valve regurgitation. No evidence of mitral stenosis. There is severe holosystolic prolapse of posterior leaflet of the mitral valve.  5. The aortic valve is tricuspid. Aortic valve regurgitation is not visualized. No aortic stenosis is present.  6. Aortic root/ascending aorta has been repaired/replaced. There is mild dilatation of the ascending aorta, measuring 38 mm. Comparison(s): No prior Echocardiogram. Conclusion(s)/Recommendation(s): Eccentric MR with flail posterior leaflet of mitral valve. Given angles cannot fully exclude severe MR. Recommend TEE for further evaluation. As patient is currently Covid positive, would consider TEE once recovered from Covid. FINDINGS  Left Ventricle: Left ventricular ejection fraction, by estimation, is 60 to 65%. The left ventricle has normal function. The left ventricle has no regional wall motion abnormalities. The left ventricular internal cavity size was normal in size. There is  no left ventricular hypertrophy. Left ventricular diastolic parameters are indeterminate. Right Ventricle: The right ventricular size is normal. No increase in right ventricular wall thickness. Right ventricular systolic function is normal. There is normal pulmonary artery systolic pressure. The tricuspid regurgitant velocity is 2.15 m/s, and  with an assumed right atrial pressure of 8 mmHg, the estimated right ventricular systolic pressure is 12.4 mmHg. Left Atrium: Left atrial size was moderately dilated. Right Atrium: Right atrial size was normal in size. Pericardium: There is no evidence of pericardial effusion. Presence of pericardial fat  pad. Mitral Valve: Flail posterior leaflet  of mitral valve, see images 57, 81. The mitral valve is myxomatous. There is severe holosystolic prolapse of posterior leaflet of the mitral valve. Moderate mitral valve regurgitation, with anteriorly-directed jet. No evidence of mitral valve stenosis. Tricuspid Valve: The tricuspid valve is normal in structure. Tricuspid valve regurgitation is trivial. No evidence of tricuspid stenosis. Aortic Valve: The aortic valve is tricuspid. Aortic valve regurgitation is not visualized. No aortic stenosis is present. Pulmonic Valve: The pulmonic valve was grossly normal. Pulmonic valve regurgitation is trivial. No evidence of pulmonic stenosis. Aorta: The aortic root/ascending aorta has been repaired/replaced. There is mild dilatation of the ascending aorta, measuring 38 mm. Venous: The inferior vena cava was not well visualized. IAS/Shunts: The atrial septum is grossly normal.  LEFT VENTRICLE PLAX 2D LVIDd:         5.30 cm  Diastology LVIDs:         3.30 cm  LV e' medial:    6.96 cm/s LV PW:         0.80 cm  LV E/e' medial:  15.8 LV IVS:        0.80 cm  LV e' lateral:   6.31 cm/s LVOT diam:     2.40 cm  LV E/e' lateral: 17.4 LV SV:         105 LV SV Index:   46 LVOT Area:     4.52 cm  RIGHT VENTRICLE RV S prime:     15.30 cm/s TAPSE (M-mode): 2.7 cm LEFT ATRIUM             Index       RIGHT ATRIUM           Index LA diam:        3.70 cm 1.60 cm/m  RA Area:     15.30 cm LA Vol (A2C):   74.1 ml 32.13 ml/m RA Volume:   35.10 ml  15.22 ml/m LA Vol (A4C):   71.3 ml 30.91 ml/m LA Biplane Vol: 74.6 ml 32.34 ml/m  AORTIC VALVE LVOT Vmax:   114.00 cm/s LVOT Vmean:  75.600 cm/s LVOT VTI:    0.233 m  AORTA Ao Root diam: 4.30 cm Ao Asc diam:  3.80 cm MITRAL VALVE                TRICUSPID VALVE MV Area (PHT): 3.85 cm     TR Peak grad:   18.5 mmHg MV Decel Time: 197 msec     TR Vmax:        215.00 cm/s MV E velocity: 110.00 cm/s MV A velocity: 96.30 cm/s   SHUNTS MV E/A ratio:  1.14         Systemic VTI:  0.23 m                              Systemic Diam: 2.40 cm Buford Dresser MD Electronically signed by Buford Dresser MD Signature Date/Time: 10/27/2020/4:02:36 PM    Final    VAS US CAROTID  Result Date: 10/27/2020 Carotid Arterial Duplex Study Indications:       Syncope. Other Factors:     Atrial fibrillation. Comparison Study:  No prior study Performing Technologist: Sharion Dove RVS  Examination Guidelines: A complete evaluation includes B-mode imaging, spectral Doppler, color Doppler, and power Doppler as needed of all accessible portions of each vessel. Bilateral testing is considered an integral part of a complete examination.  Limited examinations for reoccurring indications may be performed as noted.  Right Carotid Findings: +----------+--------+--------+--------+------------------+--------+           PSV cm/sEDV cm/sStenosisPlaque DescriptionComments +----------+--------+--------+--------+------------------+--------+ CCA Prox  96      12                                         +----------+--------+--------+--------+------------------+--------+ CCA Distal91      19                                         +----------+--------+--------+--------+------------------+--------+ ICA Prox  80      26                                         +----------+--------+--------+--------+------------------+--------+ ICA Distal75      25                                         +----------+--------+--------+--------+------------------+--------+ ECA       84      13                                         +----------+--------+--------+--------+------------------+--------+ +----------+--------+-------+--------+-------------------+           PSV cm/sEDV cmsDescribeArm Pressure (mmHG) +----------+--------+-------+--------+-------------------+ [id number]                                        +----------+--------+-------+--------+-------------------+ +---------+--------+--+--------+--+  VertebralPSV cm/s44EDV cm/s11 +---------+--------+--+--------+--+  Left Carotid Findings: +----------+--------+--------+--------+------------------+--------+           PSV cm/sEDV cm/sStenosisPlaque DescriptionComments +----------+--------+--------+--------+------------------+--------+ CCA Prox  142     23                                         +----------+--------+--------+--------+------------------+--------+ CCA Distal146     23                                         +----------+--------+--------+--------+------------------+--------+ ICA Prox  90      29                                         +----------+--------+--------+--------+------------------+--------+ ICA Distal87      25                                         +----------+--------+--------+--------+------------------+--------+ ECA       77      16                                         +----------+--------+--------+--------+------------------+--------+ +----------+--------+--------+--------+-------------------+  PSV cm/sEDV cm/sDescribeArm Pressure (mmHG) +----------+--------+--------+--------+-------------------+ Subclavian160                                         +----------+--------+--------+--------+-------------------+ +---------+--------+--+--------+-+ VertebralPSV cm/s40EDV cm/s8 +---------+--------+--+--------+-+   Summary: Right Carotid: The extracranial vessels were near-normal with only minimal wall                thickening or plaque. Left Carotid: The extracranial vessels were near-normal with only minimal wall               thickening or plaque. Vertebrals:  Bilateral vertebral arteries demonstrate antegrade flow. Subclavians: Normal flow hemodynamics were seen in bilateral subclavian              arteries. *See table(s) above for measurements and observations.  Electronically signed by Servando Snare MD on 10/27/2020 at 4:19:31 PM.    Final      Assessment  and Plan:   Syncope - Occurred in the setting of COVID. Had another transient syncopal episode in the ED after scalp laceration repair. Telemetry around that time showed sinus bradycardia with rates in the 40's to 50's but this did not last for long. Otherwise, no concerning arrhythmias have been noted on telemetry. - No acute ischemic changes on EKG. - Telemetry  - Echo showed LVEF of 60-65% with normal wall motion and moderate MR with flail posterior leaflet of mitral valve. - Carotid ultrasounds unremarkable. - Orthostatics borderline positive today. Receiving IV fluids. Also positive for BPPV. - Exact etiology of syncope unclear at this time but suspect it may have been more related to hypovolemia/orthostasis. Will continue to monitor on telemetry while admitted. Will plan for outpatient monitor.  Paroxysmal Atrial Fibrillation - Maintaining sinus rhythm. - Home Diltiazem and Toprol-XL currently on hold given positive orthostatics. - Continue Eliquis 21m twice daily. Patient reportedly only taking this once daily at home. Will need to emphasize importance of compliance prior to discharge.  Moderate MR - Patient has had known MVP with mild to moderate MR on prior Echo's.  - Echo this admission showed 60-65% with normal wall motion and moderate MR with flail posterior leaflet of mitral valve. 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 - If patient undergoes MVR, may need graft repair at the same time.  COVID - Management per primary team.   Risk Assessment/Risk Scores:  {  CHA2DS2-VASc Score = 2  This indicates a 2.2% annual risk of stroke. The patient's score is based upon: CHF History: No HTN History: No Diabetes History: No Stroke History: No Vascular Disease History: Yes Age Score: 1 Gender Score: 0    For questions or updates,  please contact [place]cePlease consult [email or web address] for contact info under    Signed, CDarreld Mclean PA-C  10/27/2020 5:23 PM   History and all data above reviewed.  Patient examined.  I agree with the findings as above.   The patient presents with a syncopal episode as described above and another event while hospitalized.  He is positive with Covid and has had febrile illness.  He has never had syncope prior to being diagnosed with Covid.  The patient exam reveals COR:RRR, 3/6 apical systolic murmur with radiation to the axilla  ,  Lungs: Decreased breath  sounds  ,  Abd: Positive bowel sounds, no rebound no guarding, Ext No edema  .  All available labs, radiology testing, previous records reviewed. Agree with documented assessment and plan.   Syncope:  No clear etiology.  I suspect that this is related to his acute viral illness and possibly orthostasis or vagal.  Carotid was unremarkable.  There are no acute EKG or telemetry abnormalities.  I am not anticipating any further in patient work up.  However, I will suggest an out patient 4 week monitor.  Atrial fib:  He has had no symptomatic or apparent recurrence of this recently.  He is to continue his Eliquis and there is some question about whether he is taking it bid.  MR:  This looks to have progressed with a flail leaflet that will need to be addressed after he is over the acute events.  I talked to him about this.  TEE and follow up with Dr. Stanford [name] can be arranged after the appropriate quarantine time and after his acute symptoms have resolved.  AO enlargement:  This is stable and the timing of fixing this might now depend on the need for MV surgery in the near future.   Minus Breeding  6:43 PM  10/27/2020

## 2020-10-27 NOTE — Progress Notes (Signed)
  Echocardiogram 2D Echocardiogram has been performed.  Augustine Radar 10/27/2020, 3:41 PM

## 2020-10-27 NOTE — Progress Notes (Signed)
Patient trasfered from ED to 5W01 via stretcher; alert and oriented x 4; no complaints of pain; IV saline locked in RFA and RH; skin dry with laceration posterior head with staples. Orient patient to room and unit; gave patient care guide; instructed how to use the call bell and  fall risk precautions. Will continue to monitor the patient.

## 2020-10-27 NOTE — ED Notes (Signed)
Breakfast Ordered 

## 2020-10-28 ENCOUNTER — Other Ambulatory Visit: Payer: Self-pay | Admitting: Student

## 2020-10-28 DIAGNOSIS — R55 Syncope and collapse: Secondary | ICD-10-CM

## 2020-10-28 MED ORDER — ONDANSETRON HCL 4 MG PO TABS: 4.0000 mg | ORAL_TABLET | Freq: Four times a day (QID) | ORAL | 0 refills | Status: DC | PRN

## 2020-10-28 MED ORDER — TRAZODONE HCL 50 MG PO TABS
50.0000 mg | ORAL_TABLET | Freq: Once | ORAL | Status: AC
  Administered 2020-10-28: 50 mg via ORAL
  Filled 2020-10-28: qty 1

## 2020-10-28 MED ORDER — METOPROLOL SUCCINATE ER 25 MG PO TB24: 12.5000 mg | ORAL_TABLET | Freq: Every day | ORAL | Status: DC

## 2020-10-28 MED ORDER — ACETAMINOPHEN 325 MG PO TABS: 650.0000 mg | ORAL_TABLET | Freq: Four times a day (QID) | ORAL | 0 refills | Status: DC | PRN

## 2020-10-28 NOTE — Discharge Instructions (Signed)
Thank you for allowing to take part in your hospital care.  You were admitted for syncope and fall.  Please follow up with Physical Therapy for Vestibular Rehab to help with your dizziness and BPPV. Take Zofran as needed for nausea  Take Eliquis 5 mg twice a day.  It is important to this medication as prescribed to prevent against strokes. Take Metoprolol 12.5 mg daily  Follow up with Cardiology in 1 week.  Follow up with PCP in 1 week for removal of staples.

## 2020-10-28 NOTE — Progress Notes (Signed)
Ordered 30-day event monitor for further evaluation of syncope. Please see Dr. Jenene Slicker rounding note from today for more information.  Corrin Parker, PA-C 10/28/2020 3:16 PM

## 2020-10-28 NOTE — Telephone Encounter (Signed)
Left message for pt to call.

## 2020-10-28 NOTE — Discharge Summary (Addendum)
Family Medicine Teaching [facility] Discharge Summary  Patient name: Johnny [name] Medical record number: [id number] Date of birth: 1956-02-27 Age: 65 y.o. Gender: male Date of Admission: 10/26/2020  Date of Discharge: 10/29/2020 Admitting Physician: Billey Co, MD  Primary Care Provider: Pcp, No Consultants: Cardiology  Indication for Hospitalization: Syncope  Discharge Diagnoses/Problem List:  Syncope Head laceration requiring repair COVID-19+  Disposition: Home  Discharge Condition: Medically stable  Discharge Exam:  Temp:  [98.6 F (37 C)-101.7 F (38.7 C)] 99.1 F (37.3 C) (02/12 0410) Pulse Rate:  [78-97] 81 (02/12 0410) Resp:  [17-20] 20 (02/12 0410) BP: (113-117)/(63-74) 117/74 (02/12 0410) SpO2:  [93 %-97 %] 94 % (02/12 0410) Weight:  [103.7 kg] 103.7 kg (02/12 0410) Physical Exam: General: Patient laying comfortably in bed, in no acute distres. Cardiovascular: RRR, no murmurs or gallops auscultated  Respiratory: lungs clear to auscultation bilaterally, normal WOB Abdomen: soft, nontender, presence of active bowel sounds Extremities: no LE edema noted bilaterally, radial and distal pulses strong and equal bilaterally   Brief Hospital Course:  Johnny [name] is a 65 y.o. male presenting with syncopal episode. PMH is significant for paroxysmal atrial fibrillation.    Syncope  Fall  Head Laceration Requiring Repair Patient was shaving his face 2/9 when he experienced vision going in and out and proceeded to sit on the toilet to steady himself. He apparently loss consciousness and hit his head on the bathtub causing a 5cm laceration requiring 9 stitches. Wife reports no convulsions, did not lose bowel or urine control. Work up included ECG, CTA of the chest, CT of head and cervical spine, CXR, orthostatic vital signs, echocardiogram, carotid ultrasound, ethanol level, urine drug screen, urinalysis, TSH, and continuous cardiac monitoring which revealed positive  orthostatic vital signs, EF 60-65%, known MVP with mitral regurg with flail posterior leaflet of mitral valve. PT/OT recommended vestibular rehab due to positive Dix-Hall Pike. Discharged home with Zofran.    COVID-19+, stable Patient diagnosed with COVID 2/3, also the day of symptom onset. Patient is unvaccinated. Received azithromycin, dexamethasone, folate, hydroxyurea, and tessalon pearls on 2/5, completed 2/7. Inpatient treatment for COVID was not initiated. Vital signs remained stable throughout admission except for two fevers (Tmax 101.57F) and patient did not require supplemental oxygen.   Atrial Fibrillation, stable Patient reports diagnosed 10 years ago, but intermittently symptomatic. Started Eliquis one year ago. Admission EKG showed sinus rhythm. Remained in sinus rhythm throughout admission without symptoms. Discuss rate control medications due to syncopal episode. He will continue Eliquis and Metoprolol on discharge.  Cardiology will follow up outpatient.  Thoracic Aortic Aneurysm, stable Follows with cardiology since diagnosis with routine imaging.      Issues for Follow Up:  1. Mitral valve regurgitation with flail posterior leaflet of valve - TEE recommended 2. Thoracic aortic aneurysm 3. Please ensure he has appropriate follow-up with physical therapy for vestibular rehab and BPPV. 4. Suture removal in 7-10 days, follow up with PCP regarding this.  5. Cardiology will follow up outpatient 6. Please ensure patient continues Eliquis BID. 7. Encourage patient to get COVID vaccinations  Significant Procedures: Laceration repair, chocardiogram, carotid doppler ultrasound  Significant Labs and Imaging:  Recent Labs  Lab 10/26/20 1043 10/27/20 0759  WBC 5.1 5.0  HGB 16.3 16.2  HCT 47.8 48.4  PLT 151 142*   Recent Labs  Lab 10/26/20 1043 10/27/20 0759  NA 135 136  K 4.0 4.2  CL 104 103  CO2 19* 22  GLUCOSE 115* 88  BUN 22 17  CREATININE 1.26* 1.09  CALCIUM 8.6*  8.4*  ALKPHOS 65  --   AST 30  --   ALT 33  --   ALBUMIN 3.3*  --      Results/Tests Pending at Time of Discharge:  none  Discharge Medications:  Allergies as of 10/29/2020   No Known Allergies     Medication List    STOP taking these medications   diltiazem 240 MG 24 hr capsule Commonly known as: CARDIZEM CD   propranolol 10 MG tablet Commonly known as: INDERAL     TAKE these medications   acetaminophen 325 MG tablet Commonly known as: TYLENOL Take 2 tablets (650 mg total) by mouth every 6 (six) hours as needed for mild pain (or Fever >/= 101).   Eliquis 5 MG Tabs tablet Generic drug: apixaban Take 5 mg by mouth 2 (two) times daily.   metoprolol succinate 25 MG 24 hr tablet Commonly known as: TOPROL-XL Take 25 mg by mouth daily.   ondansetron 4 MG tablet Commonly known as: ZOFRAN Take 1 tablet (4 mg total) by mouth every 6 (six) hours as needed for nausea.   VITAMIN D (CHOLECALCIFEROL) PO Take 1 capsule by mouth daily.   zinc gluconate 50 MG tablet Take 50 mg by mouth daily.       Discharge Instructions: Please refer to Patient Instructions section of EMR for full details.  Patient was counseled important signs and symptoms that should prompt return to medical care, changes in medications, dietary instructions, activity restrictions, and follow up appointments.   Follow-Up Appointments:   Dana Allan, MD 10/29/2020, 11:08 AM PGY-2, [facility]

## 2020-10-28 NOTE — Progress Notes (Signed)
Physical Therapy Treatment Patient Details Name: Johnny [name] MRN: [id number] DOB: 03-03-1956 Today's Date: 10/28/2020    History of Present Illness Johnny [name] is a 65 y.o. male presenting with syncope with laceration on posterior head. Patient recently diagnosed with COVID 2/3, the day of symptom onset. Patient and wife are unvaccinated. Reports illness was mild without shortness of breath, cough, chest pain. Received "5 pill therapy" on 2/5, completed 2/7.    PT Comments    Pt reluctantly agreeable to be tested and treated for BPPV again today, but he did.  He remained (+) for R posterior canal BPPV and I believe it to be superimposed on some post-concussive symptoms as well.  Treated x 2 with Epley's with complete resolution of symptoms/nystagmus on the second round (treated anyway since we were there and research supports multiple Epley's).  I still believe he could use follow up OP PT in case his symptoms do not clear.  We also spoke about post concussive symptoms, progression and to report any longer lasting/lingering symptoms at follow up appointments to MDs.  PT will continue to follow acutely for safe mobility progression.  Re-check R posterior canal tomorrow.  If no symptoms/nystagmus, don't treat.  Needs to walk.   Follow Up Recommendations  Outpatient PT (vestibular)     Equipment Recommendations  None recommended by PT    Recommendations for Other Services       Precautions / Restrictions Precautions Precautions: Fall Precaution Comments: syncopal episode    Mobility  Bed Mobility Overal bed mobility: Modified Independent             General bed mobility comments: hand held assist used, but only for convienence, not because I think he needed it.    Transfers                    Ambulation/Gait                 Stairs             Wheelchair Mobility    Modified Rankin (Stroke Patients Only)       Balance       10/28/20 0001   Symptom Behavior  Subjective history of current problem dizziness since head injury where he passed out and his posterior asptect of head on tub.  Spinning.  Type of Dizziness  Spinning  Frequency of Dizziness intermittent  Duration of Dizziness short, intense  Symptom Nature Motion provoked;Positional;Intermittent  Aggravating Factors Activity in general;Rolling to right;Rolling to left  Relieving Factors Head stationary;Slow movements  Progression of Symptoms Better  History of similar episodes no  Oculomotor Exam  Oculomotor Alignment Normal  Spontaneous Absent  Gaze-induced  Absent  Smooth Pursuits Intact  Positional Testing  Dix-Hallpike Dix-Hallpike Left;Dix-Hallpike Right  Horizontal Canal Testing Horizontal Canal Right;Horizontal Canal Left  Dix-Hallpike Right  Dix-Hallpike Right Duration 6  Dix-Hallpike Right Symptoms Upbeat, right rotatory nystagmus  Dix-Hallpike Left  Dix-Hallpike Left Duration 0  Dix-Hallpike Left Symptoms No nystagmus  Horizontal Canal Right  Horizontal Canal Right Duration 0  Horizontal Canal Right Symptoms Normal  Horizontal Canal Left  Horizontal Canal Left Duration 0  Horizontal Canal Left Symptoms Normal                                          Cognition Arousal/Alertness: Awake/alert Behavior During Therapy: WFL for tasks  assessed/performed Overall Cognitive Status: Within Functional Limits for tasks assessed                                        Exercises      General Comments        Pertinent Vitals/Pain Pain Assessment: Faces Faces Pain Scale: Hurts little more Pain Location: back of head lac from fall with staples, HA Pain Descriptors / Indicators: Grimacing;Guarding Pain Intervention(s): Limited activity within patient's tolerance;Monitored during session;Repositioned;RN gave pain meds during session    Home Living                      Prior Function            PT Goals  (current goals can now be found in the care plan section) Acute Rehab PT Goals Patient Stated Goal: to get back to independent Progress towards PT goals: Progressing toward goals    Frequency    Min 3X/week      PT Plan Current plan remains appropriate    Co-evaluation              AM-PAC PT "6 Clicks" Mobility   Outcome Measure  Help needed turning from your back to your side while in a flat bed without using bedrails?: A Little Help needed moving from lying on your back to sitting on the side of a flat bed without using bedrails?: A Little Help needed moving to and from a bed to a chair (including a wheelchair)?: A Little Help needed standing up from a chair using your arms (e.g., wheelchair or bedside chair)?: A Little Help needed to walk in hospital room?: A Little Help needed climbing 3-5 steps with a railing? : A Little 6 Click Score: 18    End of Session   Activity Tolerance: Patient limited by pain;Other (comment) (limited by nausea and dizziness, medicated with po nausea meds at beginning of session) Patient left: in bed;with call bell/phone within reach;with bed alarm set   PT Visit Diagnosis: BPPV;Dizziness and giddiness (R42) BPPV - Right/Left : Right     Time: [phone] (only charged 3 because multiple canalith repositioning and can only charge one canalith) PT Time Calculation (min) (ACUTE ONLY): 90 min  Charges:  $Therapeutic Activity: 23-37 mins $Canalith Rep Proc: 8-22 mins                     Corinna Capra, PT, DPT  Acute Rehabilitation [phone] pager [phone]) [phone] office

## 2020-10-28 NOTE — Progress Notes (Signed)
Family Medicine Teaching Service Daily Progress Note Intern Pager: [phone]  Patient name: Johnny [name] Medical record number: [id number] Date of birth: May 01, 1956 Age: 65 y.o. Gender: male  Primary Care Provider: Pcp, No Consultants: Cardiology Code Status: Full  Pt Overview and Major Events to Date:  Admitted 2/9 for syncope work-up  Assessment and Plan: Johnny [name] is a 65 y.o. male presenting with syncopal episode. PMH is significant for COVID+ diagnosed 2/3, atrial fibrillation s/p cardioversion 2017, rheumatic fever age 11, stable thoracic aortic aneurysm.   Syncope  Fall  Head Laceration Requiring Repair TSH 0.645, urinalysis negative, UDS negative. Orthostatic vital signs showed orthostatic hypotension:  (111/98 sitting, 115/86 initial standing, 94/81 standing after 3 min). Carotid massage performed without evidence of syncope or symptoms. No additional episodes of syncope overnight.  Echo showed EF 60-65% with normal wall motion and moderate MR with flail posterior leaflet of mitral valve. Carotid ultrasound unremarkable. -Blood cultures x2: no growth at 2 days -Hold home BP meds 2/2 orthostasis, will discuss with Cardiology recommendations for plan to restart or continue to hold on discharge. -Cardiac monitoring  -D/C IVF -Cardiac recs: most likely related to hypovolemia or orthostasis, continue telemetry, TEE to evaluate MVP after COVID recovery   -PT/OT: Positive [place] on R indicating R posterior canal BPPV, recommends outpatient vestibular PT - Follow up with PCP for removal of sutures   COVID-19+ Patient diagnosed with COVID 2/3, the day of symptom onset. Patient is unvaccinated. Received azithromycin, dexamethasone, folate, hydroxyurea, and tessalon pearls on 2/5, completed 2/7. Reports illness was mild. Telemetry indicated hypoxia (88-91%) overnight 2/9-2/10, but is able to maintain >95% when awake without increased work of breathing. He spiked two fevers  overnight (T max 101.88F). -Hypoxia might be due to undiagnosed OSA, consider sleep study as outpatient  -Contact and airborne precautions  -Notify if new oxygen requirement (goal >92%), continuous pulse oximetry -Will need to continue to self isolate and use mask at home per COVID recommendations.  Atrial Fibrillation Patient reports diagnosed 10 years prior, but intermittently symptomatic. Started Eliquis one year ago. Remains in sinus throughout admission.  -Continue PTA Eliquis 5mg  qd (prescribed BID, but patient taking qd) -Hold home metoprolol and cardizem 2/2 orthostasis -Will discuss with Cardiology when to resume medications.    Thoracic Aortic Aneurysm, stable -Follows with cardiology, Dr. -Repeat imaging in 6 months to 1 year   FEN/GI: regular PPx: Home Eliquis 5mg   Disposition: FPTS, observation status  Subjective:  Patient seen at beside today, resting comfortably on his right side. No acute complaints. Denies dizziness until change of position, consistent with BPPV. Denies SOB, cough. Endorses some nausea, little PO intake, and no vomiting.   Objective: Temp:  [98.9 F (37.2 C)-101.5 F (38.6 C)] 98.9 F (37.2 C) (02/11 0625) Pulse Rate:  [62-83] 62 (02/11 0625) Resp:  [12-23] 16 (02/11 0625) BP: (100-125)/(55-98) 122/59 (02/11 0625) SpO2:  [88 %-98 %] 97 % (02/11 0625) Weight:  [104.5 kg] 104.5 kg (02/11 0310)  Physical Exam:  General: 65 y.o. male in NAD, pleasant and cooperative.  Cardio: S1 s2 noted, systolic murmur consistent with MVP appreciated. Lungs: CTAB, no increased work of breathing on room air  Abdomen: Soft, nontender, nondistended Skin: Warm and dry, slightly pale but no change since admission  Extremities: No edema noted   Laboratory: Recent Labs  Lab 10/26/20 1043 10/27/20 0759  WBC 5.1 5.0  HGB 16.3 16.2  HCT 47.8 48.4  PLT 151 142*   Recent Labs  Lab 10/26/20 1043 10/27/20 0759  NA 135 136  K 4.0 4.2  CL 104 103   CO2 19* 22  BUN 22 17  CREATININE 1.26* 1.09  CALCIUM 8.6* 8.4*  PROT 6.1*  --   BILITOT 1.0  --   ALKPHOS 65  --   ALT 33  --   AST 30  --   GLUCOSE 115* 88    Imaging/Diagnostic Tests: Carotid U/S Duplex 10/27/2020 Summary:  Right Carotid: The extracranial vessels were near-normal with only minimal  wall thickening or plaque.  Left Carotid: The extracranial vessels were near-normal with only minimal  wall thickening or plaque.  Vertebrals: Bilateral vertebral arteries demonstrate antegrade flow.  Subclavians: Normal flow hemodynamics were seen in bilateral subclavian arteries.   ECHO Complete 10/27/2020 Left Ventricle: Left ventricular ejection fraction, by estimation, is 60 to 65%. The left ventricle has normal function. The left ventricle has no regional wall motion abnormalities. The left ventricular internal cavity size was normal in size. There is no left ventricular hypertrophy. Left ventricular diastolic parameters are indeterminate.  Right Ventricle: The right ventricular size is normal. No increase in right ventricular wall thickness. Right ventricular systolic function is normal. There is normal pulmonary artery systolic pressure. The tricuspid regurgitant velocity is 2.15 m/s, and with an assumed right atrial pressure of 8 mmHg, the estimated right ventricular systolic pressure is 26.5 mmHg.  Left Atrium: Left atrial size was moderately dilated.  Right Atrium: Right atrial size was normal in size.  Pericardium: There is no evidence of pericardial effusion. Presence of pericardial fat pad.  Mitral Valve: Flail posterior leaflet of mitral valve, see images 57, 81. The mitral valve is myxomatous. There is severe holosystolic prolapse of posterior leaflet of the mitral valve. Moderate mitral valve  regurgitation, with anteriorly-directed jet. No evidence of mitral valve stenosis.  Tricuspid Valve: The tricuspid valve is normal in structure. Tricuspid valve regurgitation is  trivial. No evidence of tricuspid stenosis.  Aortic Valve: The aortic valve is tricuspid. Aortic valve regurgitation is not visualized. No aortic stenosis is present.  Pulmonic Valve: The pulmonic valve was grossly normal. Pulmonic valve regurgitation is trivial. No evidence of pulmonic stenosis.  Aorta: The aortic root/ascending aorta has been repaired/replaced. There is mild dilatation of the ascending aorta, measuring 38 mm.  Venous: The inferior vena cava was not well visualized.  IAS/Shunts: The atrial septum is grossly normal.   CT Head And Chest Wo Contrast 10/26/2020 IMPRESSION:  1. No acute intracranial abnormality.  2. No evidence of acute fracture or subluxation of the cervical spine, see below with regards to the thoracic spine.  3. Compression fractures at the T1 and potentially T2 levels are age indeterminate, not well assessed due to extensive artifact due to beam attenuation about the shoulders. These are essentially age indeterminate compression fractures in the upper thoracic spine, no gross edema in the area but with limited assessment due to beam attenuation in this location. Dedicated imaging of this area could be considered as clinically warranted.  4. Degenerative changes in the cervical spine greatest at C3-4 as described.   CT Angio Chest PE W and/or Wo Contrast 10/26/2020 IMPRESSION: 1.  No evident pulmonary embolus.  2. Stable prominence of the aorta with a measured diameter at the sinuses of Valsalva measuring 4.9 cm. Ascending thoracic aortic diameter measures 3.8 x 3.8 cm, essentially stable compared to prior MR (08/2019). Ascending thoracic aortic aneurysm. Recommend semi-annual imaging followup by CTA or MRA and referral to cardiothoracic surgery  if not already obtained. Foci of aortic atherosclerosis noted.  3. Areas of airspace opacity at multiple sites in the lungs consistent with atypical organism pneumonia. No consolidation. No pleural effusions.  4.  No evident  adenopathy.  DG Chest Port 1 View 10/26/2020 IMPRESSION: No active disease.   Rae Lips, Medical Student 10/28/2020, 9:36 AM Acting Intern, [facility] FPTS Intern pager: [phone], text pages welcome   FPTS Upper-Level Resident Addendum   I have independently interviewed and examined the patient. I have discussed the above with the original author and agree with their documentation. Please see also any attending notes.    Dana Allan, MD PGY-2, [facility] 10/28/2020 1:03 PM  FPTS Service pager: [phone] (text pages welcome through [facility])

## 2020-10-28 NOTE — Plan of Care (Signed)
  Problem: Health Behavior/Discharge Planning: Goal: Ability to manage health-related needs will improve Outcome: Progressing   Problem: Clinical Measurements: Goal: Ability to maintain clinical measurements within normal limits will improve Outcome: Progressing Goal: Will remain free from infection Outcome: Progressing   Problem: Activity: Goal: Risk for activity intolerance will decrease Outcome: Progressing   Problem: Nutrition: Goal: Adequate nutrition will be maintained Outcome: Progressing   Problem: Coping: Goal: Level of anxiety will decrease Outcome: Progressing   Problem: Safety: Goal: Ability to remain free from injury will improve Outcome: Progressing   Problem: Education: Goal: Knowledge of risk factors and measures for prevention of condition will improve Outcome: Progressing   Problem: Coping: Goal: Psychosocial and spiritual needs will be supported Outcome: Progressing   Problem: Respiratory: Goal: Complications related to the disease process, condition or treatment will be avoided or minimized Outcome: Progressing

## 2020-10-28 NOTE — Progress Notes (Signed)
Progress Note  Patient Name: Johnny [name] Date of Encounter: 10/28/2020  Primary Cardiologist:   Olga Millers, MD   Subjective   He is fatigued but a little less dizzy.  No acute SOB.   Inpatient Medications    Scheduled Meds: . apixaban  5 mg Oral BID   Continuous Infusions:  PRN Meds: acetaminophen **OR** acetaminophen, ondansetron **OR** ondansetron (ZOFRAN) IV   Vital Signs    Vitals:   10/28/20 0225 10/28/20 0310 10/28/20 0625 10/28/20 1226  BP: (!) 109/59  (!) 122/59 113/63  Pulse: 68  62 78  Resp: 18  16 17   Temp: 99.2 F (37.3 C)  98.9 F (37.2 C) 98.9 F (37.2 C)  TempSrc: Oral  Oral Oral  SpO2: 97%  97% 97%  Weight:  104.5 kg    Height:        Intake/Output Summary (Last 24 hours) at 10/28/2020 1507 Last data filed at 10/28/2020 1240 Gross per 24 hour  Intake 1913.86 ml  Output 2350 ml  Net -436.14 ml   Filed Weights   10/26/20 1038 10/28/20 0310  Weight: 104.3 kg 104.5 kg    Telemetry    NSR, no bradyarrhythmia - Personally Reviewed  ECG    NA - Personally Reviewed  Physical Exam   GEN: No acute distress.   Neck: No JVD Cardiac: RRR, 3/6 apical systolic murmur, no diastolic murmurs, rubs, or gallops.  Respiratory: Clear  to auscultation bilaterally. GI: Soft, nontender, non-distended  MS: No edema; No deformity. Neuro:  Nonfocal  Psych: Normal affect   Labs    Chemistry Recent Labs  Lab 10/26/20 1043 10/27/20 0759  NA 135 136  K 4.0 4.2  CL 104 103  CO2 19* 22  GLUCOSE 115* 88  BUN 22 17  CREATININE 1.26* 1.09  CALCIUM 8.6* 8.4*  PROT 6.1*  --   ALBUMIN 3.3*  --   AST 30  --   ALT 33  --   ALKPHOS 65  --   BILITOT 1.0  --   GFRNONAA >60 >60  ANIONGAP 12 11     Hematology Recent Labs  Lab 10/26/20 1043 10/27/20 0759  WBC 5.1 5.0  RBC 5.26 5.25  HGB 16.3 16.2  HCT 47.8 48.4  MCV 90.9 92.2  MCH 31.0 30.9  MCHC 34.1 33.5  RDW 14.1 14.1  PLT 151 142*    Cardiac EnzymesNo results for input(s):  TROPONINI in the last 168 hours. No results for input(s): TROPIPOC in the last 168 hours.   BNPNo results for input(s): BNP, PROBNP in the last 168 hours.   DDimer  Recent Labs  Lab 10/26/20 1043  DDIMER 5.69*     Radiology    ECHOCARDIOGRAM COMPLETE  Result Date: 10/27/2020    ECHOCARDIOGRAM REPORT   Patient Name:   Johnny [name] Date of Exam: 10/27/2020 Medical Rec #:  12/25/2020     Height:       74.0 in Accession #:    [id number]    Weight:       230.0 lb Date of Birth:  March 24, 1956     BSA:          2.306 m Patient Age:    64 years      BP:           127/75 mmHg Patient Gender: M             HR:           79  bpm. Exam Location:  Inpatient Procedure: 2D Echo, Cardiac Doppler and Color Doppler                       REPORT CONTAINS CRITICAL RESULT  Findings communicated to inpatient attending physician Dr. Miquel Dunn at 4:02 PM. Indications:    Syncope 780.2 / R55  History:        Patient has no prior history of Echocardiogram examinations.                 Arrythmias:Atrial Fibrillation; Signs/Symptoms:Syncope.  Sonographer:    Eulah Pont RDCS Referring Phys: [id number] SARA L NEAL IMPRESSIONS  1. Left ventricular ejection fraction, by estimation, is 60 to 65%. The left ventricle has normal function. The left ventricle has no regional wall motion abnormalities. Left ventricular diastolic parameters are indeterminate.  2. Right ventricular systolic function is normal. The right ventricular size is normal. There is normal pulmonary artery systolic pressure.  3. Left atrial size was moderately dilated.  4. Flail posterior leaflet of mitral valve, see images 57, 81. The mitral valve is myxomatous. Moderate mitral valve regurgitation. No evidence of mitral stenosis. There is severe holosystolic prolapse of posterior leaflet of the mitral valve.  5. The aortic valve is tricuspid. Aortic valve regurgitation is not visualized. No aortic stenosis is present.  6. Aortic root/ascending aorta has been repaired/replaced.  There is mild dilatation of the ascending aorta, measuring 38 mm. Comparison(s): No prior Echocardiogram. Conclusion(s)/Recommendation(s): Eccentric MR with flail posterior leaflet of mitral valve. Given angles cannot fully exclude severe MR. Recommend TEE for further evaluation. As patient is currently Covid positive, would consider TEE once recovered from Covid. FINDINGS  Left Ventricle: Left ventricular ejection fraction, by estimation, is 60 to 65%. The left ventricle has normal function. The left ventricle has no regional wall motion abnormalities. The left ventricular internal cavity size was normal in size. There is  no left ventricular hypertrophy. Left ventricular diastolic parameters are indeterminate. Right Ventricle: The right ventricular size is normal. No increase in right ventricular wall thickness. Right ventricular systolic function is normal. There is normal pulmonary artery systolic pressure. The tricuspid regurgitant velocity is 2.15 m/s, and  with an assumed right atrial pressure of 8 mmHg, the estimated right ventricular systolic pressure is 26.5 mmHg. Left Atrium: Left atrial size was moderately dilated. Right Atrium: Right atrial size was normal in size. Pericardium: There is no evidence of pericardial effusion. Presence of pericardial fat pad. Mitral Valve: Flail posterior leaflet of mitral valve, see images 57, 81. The mitral valve is myxomatous. There is severe holosystolic prolapse of posterior leaflet of the mitral valve. Moderate mitral valve regurgitation, with anteriorly-directed jet. No evidence of mitral valve stenosis. Tricuspid Valve: The tricuspid valve is normal in structure. Tricuspid valve regurgitation is trivial. No evidence of tricuspid stenosis. Aortic Valve: The aortic valve is tricuspid. Aortic valve regurgitation is not visualized. No aortic stenosis is present. Pulmonic Valve: The pulmonic valve was grossly normal. Pulmonic valve regurgitation is trivial. No evidence  of pulmonic stenosis. Aorta: The aortic root/ascending aorta has been repaired/replaced. There is mild dilatation of the ascending aorta, measuring 38 mm. Venous: The inferior vena cava was not well visualized. IAS/Shunts: The atrial septum is grossly normal.  LEFT VENTRICLE PLAX 2D LVIDd:         5.30 cm  Diastology LVIDs:         3.30 cm  LV e' medial:    6.96 cm/s LV PW:  0.80 cm  LV E/e' medial:  15.8 LV IVS:        0.80 cm  LV e' lateral:   6.31 cm/s LVOT diam:     2.40 cm  LV E/e' lateral: 17.4 LV SV:         105 LV SV Index:   46 LVOT Area:     4.52 cm  RIGHT VENTRICLE RV S prime:     15.30 cm/s TAPSE (M-mode): 2.7 cm LEFT ATRIUM             Index       RIGHT ATRIUM           Index LA diam:        3.70 cm 1.60 cm/m  RA Area:     15.30 cm LA Vol (A2C):   74.1 ml 32.13 ml/m RA Volume:   35.10 ml  15.22 ml/m LA Vol (A4C):   71.3 ml 30.91 ml/m LA Biplane Vol: 74.6 ml 32.34 ml/m  AORTIC VALVE LVOT Vmax:   114.00 cm/s LVOT Vmean:  75.600 cm/s LVOT VTI:    0.233 m  AORTA Ao Root diam: 4.30 cm Ao Asc diam:  3.80 cm MITRAL VALVE                TRICUSPID VALVE MV Area (PHT): 3.85 cm     TR Peak grad:   18.5 mmHg MV Decel Time: 197 msec     TR Vmax:        215.00 cm/s MV E velocity: 110.00 cm/s MV A velocity: 96.30 cm/s   SHUNTS MV E/A ratio:  1.14         Systemic VTI:  0.23 m                             Systemic Diam: 2.40 cm Jodelle RedBridgette Christopher MD Electronically signed by Jodelle RedBridgette Christopher MD Signature Date/Time: 10/27/2020/4:02:36 PM    Final    VAS US CAROTID  Result Date: 10/27/2020 Carotid Arterial Duplex Study Indications:       Syncope. Other Factors:     Atrial fibrillation. Comparison Study:  No prior study Performing Technologist: Sherren Kernsandace Kanady RVS  Examination Guidelines: A complete evaluation includes B-mode imaging, spectral Doppler, color Doppler, and power Doppler as needed of all accessible portions of each vessel. Bilateral testing is considered an integral part of a complete  examination. Limited examinations for reoccurring indications may be performed as noted.  Right Carotid Findings: +----------+--------+--------+--------+------------------+--------+           PSV cm/sEDV cm/sStenosisPlaque DescriptionComments +----------+--------+--------+--------+------------------+--------+ CCA Prox  96      12                                         +----------+--------+--------+--------+------------------+--------+ CCA Distal91      19                                         +----------+--------+--------+--------+------------------+--------+ ICA Prox  80      26                                         +----------+--------+--------+--------+------------------+--------+ ICA  Distal75      25                                         +----------+--------+--------+--------+------------------+--------+ ECA       84      13                                         +----------+--------+--------+--------+------------------+--------+ +----------+--------+-------+--------+-------------------+           PSV cm/sEDV cmsDescribeArm Pressure (mmHG) +----------+--------+-------+--------+-------------------+ [id number]                                        +----------+--------+-------+--------+-------------------+ +---------+--------+--+--------+--+ VertebralPSV cm/s44EDV cm/s11 +---------+--------+--+--------+--+  Left Carotid Findings: +----------+--------+--------+--------+------------------+--------+           PSV cm/sEDV cm/sStenosisPlaque DescriptionComments +----------+--------+--------+--------+------------------+--------+ CCA Prox  142     23                                         +----------+--------+--------+--------+------------------+--------+ CCA Distal146     23                                         +----------+--------+--------+--------+------------------+--------+ ICA Prox  90      29                                          +----------+--------+--------+--------+------------------+--------+ ICA Distal87      25                                         +----------+--------+--------+--------+------------------+--------+ ECA       77      16                                         +----------+--------+--------+--------+------------------+--------+ +----------+--------+--------+--------+-------------------+           PSV cm/sEDV cm/sDescribeArm Pressure (mmHG) +----------+--------+--------+--------+-------------------+ Subclavian160                                         +----------+--------+--------+--------+-------------------+ +---------+--------+--+--------+-+ VertebralPSV cm/s40EDV cm/s8 +---------+--------+--+--------+-+   Summary: Right Carotid: The extracranial vessels were near-normal with only minimal wall                thickening or plaque. Left Carotid: The extracranial vessels were near-normal with only minimal wall               thickening or plaque. Vertebrals:  Bilateral vertebral arteries demonstrate antegrade flow. Subclavians: Normal flow hemodynamics were seen in bilateral subclavian              arteries. *See table(s) above for measurements  and observations.  Electronically signed by Lemar Livings MD on 10/27/2020 at 4:19:31 PM.    Final     Cardiac Studies   ECHO:  1. Left ventricular ejection fraction, by estimation, is 60 to 65%. The  left ventricle has normal function. The left ventricle has no regional  wall motion abnormalities. Left ventricular diastolic parameters are  indeterminate.  2. Right ventricular systolic function is normal. The right ventricular  size is normal. There is normal pulmonary artery systolic pressure.  3. Left atrial size was moderately dilated.  4. Flail posterior leaflet of mitral valve, see images 57, 81. The mitral  valve is myxomatous. Moderate mitral valve regurgitation. No evidence of  mitral stenosis. There is  severe holosystolic prolapse of posterior  leaflet of the mitral valve.  5. The aortic valve is tricuspid. Aortic valve regurgitation is not  visualized. No aortic stenosis is present.  6. Aortic root/ascending aorta has been repaired/replaced. There is mild  dilatation of the ascending aorta, measuring 38 mm.    Patient Profile     65 y.o. male with a history of paroxymal atrial fibrillation on Eliquis, thoracic aortic aneurysm, rheumatic fever as a young child (age 84), and mild to moderate MR who is being seen for the evaluation of syncope in the setting of COVID at the request of Dr. Miquel Dunn.  Assessment & Plan    SYNCOPE:  I suspect vagal episode.  He needs a 4 week event monitor.  Hold Cardizem at discharge.  OK to restart low dose beta blocker.  MR:  Increased regurgitation with partial flail leaflet.  He will need elective repair.  Discussed with Dr. Cornelius Moras and a note sent to Dr. Jens Som who can arrange follow up TEE and surgical referral.  ATRIAL FIB:  He has been in sinus.  Hold Cardizem with lower BPs for now.  This can be restarted as an outpatient.    COVID:  Plan per primary team.  ASCENDING AORTIC ENLARGEMENT:    Will follow and discuss in the context of his eventual surgical mitral valve repair.    For questions or updates, please contact [facility] Please consult [email or web address] for contact info under Cardiology/STEMI.   Signed, Rollene Rotunda, MD  10/28/2020, 3:07 PM

## 2020-10-29 DIAGNOSIS — U071 COVID-19: Secondary | ICD-10-CM | POA: Diagnosis not present

## 2020-10-29 DIAGNOSIS — R55 Syncope and collapse: Secondary | ICD-10-CM | POA: Diagnosis not present

## 2020-10-29 DIAGNOSIS — S0101XA Laceration without foreign body of scalp, initial encounter: Secondary | ICD-10-CM | POA: Diagnosis not present

## 2020-10-29 LAB — GLUCOSE, CAPILLARY: Glucose-Capillary: 93 mg/dL (ref 70–99)

## 2020-10-29 NOTE — Progress Notes (Signed)
Greggory Keen [place] to be D/C'd home per MD order. Discussed with the patient and wife and all questions fully answered.  Skin clean, dry and intact without evidence of skin break down, no evidence of skin tears noted.  IV catheters discontinued intact. Site without signs and symptoms of complications. Dressing and pressure applied.  An After Visit Summary was printed and given to the patient.  Patient escorted via WC, and D/C home via private auto.  Jon Gills  10/29/2020 4:34 PM

## 2020-10-31 ENCOUNTER — Encounter: Payer: Self-pay | Admitting: *Deleted

## 2020-10-31 ENCOUNTER — Encounter (HOSPITAL_BASED_OUTPATIENT_CLINIC_OR_DEPARTMENT_OTHER): Payer: Self-pay | Admitting: Emergency Medicine

## 2020-10-31 LAB — CULTURE, BLOOD (ROUTINE X 2)
Culture: NO GROWTH
Culture: NO GROWTH

## 2020-10-31 NOTE — Progress Notes (Signed)
Patient ID: Johnny [name], male   DOB: 1955-11-06, 65 y.o.   MRN: [id number] Patient enrolled for [employer] to ship a 30 day cardiac event monitor to his home.  Letter with instructions mailed to patient.

## 2020-10-31 NOTE — Telephone Encounter (Signed)
Patient is currently admitted

## 2020-11-04 ENCOUNTER — Ambulatory Visit: Payer: Self-pay | Admitting: *Deleted

## 2020-11-04 ENCOUNTER — Ambulatory Visit: Payer: Medicare Other | Attending: Family Medicine

## 2020-11-04 ENCOUNTER — Other Ambulatory Visit: Payer: Self-pay

## 2020-11-04 DIAGNOSIS — R42 Dizziness and giddiness: Secondary | ICD-10-CM | POA: Diagnosis not present

## 2020-11-04 DIAGNOSIS — H8113 Benign paroxysmal vertigo, bilateral: Secondary | ICD-10-CM | POA: Insufficient documentation

## 2020-11-04 DIAGNOSIS — R2681 Unsteadiness on feet: Secondary | ICD-10-CM | POA: Diagnosis not present

## 2020-11-04 NOTE — Telephone Encounter (Signed)
Pt called in saying he was seen in the ED because he fainted and fell in the floor.   I was brought to the ER last Wed. (2/9/202).  I was tested for covid and it was positive.    I'm just wanting to know if I'm doing everything the doctor told me to do.  I asked him if he had the discharge papers he was given as he was admitted to the hospital.  He has those.   I pulled up the AVS and went over it with him.   There was the question of taking the Zofran for nausea since he has A. Fib.   "I have taken all of the Zofran and I'm still having nausea and not eating.  I'm drinking fluds but I don't have an appetite.    I advised him to check with his cardiologist regarding taking the Zofran with the A.Fib.   He verbalized he would do that.  He is working on getting a PCP now.  I advised him to drink fluids like Gator Aid and Pedialyte for hydration.  If he is not eating at all he should go to the urgent care if he is not able to eat any food for this long.  He verbalized understanding of that. The AVS also mentioned having staples removed.   I advised him to go to the urgent care to have the staples removed.  I answered his questions with the advise to go to the urgent care for staple removal and the ongoing nausea.  He was agreeable and thanked me for my help  Reason for Disposition . Health Information question, no triage required and triager able to answer question  Answer Assessment - Initial Assessment Questions 1. REASON FOR CALL or QUESTION: "What is your reason for calling today?" or "How can I best help you?" or "What question do you have that I can help answer?"     See documentation note  Protocols used: INFORMATION ONLY CALL - NO TRIAGE-A-AH

## 2020-11-04 NOTE — Therapy (Signed)
[facility] [address] [place], [place], [address] Phone: [phone]   Fax:  [phone]  Physical Therapy Evaluation  Patient Details  Name: Johnny [name] MRN: [id number] Date of Birth: 1956/05/24 Referring Provider (PT): Burley Saver, MD   Encounter Date: 11/04/2020   PT End of Session - 11/04/20 1037    Visit Number 1    Number of Visits 5    Date for PT Re-Evaluation 01/03/21   POC for 4 weeks, Cert for 60 days   Authorization Type UHC Medicare    PT Start Time 1037   patient arriving late   PT Stop Time 1125    PT Time Calculation (min) 48 min    Activity Tolerance Patient tolerated treatment well   nauseous during sensation   Behavior During Therapy [facility] for tasks assessed/performed           Past Medical History:  Diagnosis Date  . Atrial fibrillation (HCC)   . Chronic insomnia   . History of rheumatic fever    age 5  . Mitral regurgitation   . Patent foramen ovale   . Thoracic aortic aneurysm [facility])     Past Surgical History:  Procedure Laterality Date  . CARDIOVERSION      There were no vitals filed for this visit.    Subjective Assessment - 11/04/20 1041    Subjective Patient had COVID. On 2/9, patient had syncope event leading to fall and laceration on posterior head. Patient was treated for BPPV in hospital with + R Gilberto Better noted. Patient still reports mild spinning sensation when in bed. Patient reports that he has been nauseous. Unsure of it is the fall and hit to the head or the vertigo that it causing him to feel this way. Patient also reports increased HA.    Patient is accompained by: Family member    Pertinent History PAF, Thoracic Aortic Aneursym, PFO    Limitations Other (comment)   Bed Mobility   Patient Stated Goals Resolve Vertigo    Currently in Pain? No/denies              [facility] PT Assessment - 11/04/20 0001      Assessment   Medical Diagnosis Vertigo     Referring Provider (PT) Burley Saver, MD    Onset Date/Surgical Date 10/26/20      Precautions   Precautions Other (comment)   Vertigo     Balance Screen   Has the patient fallen in the past 6 months Yes    How many times? 1 (fall with laceration to posterior head)    Has the patient had a decrease in activity level because of a fear of falling?  No    Is the patient reluctant to leave their home because of a fear of falling?  No      Home Environment   Living Environment Private residence    Living Arrangements Spouse/significant other    Available Help at Discharge Family    Type of Home House    Home Layout Two level    Alternate Level Stairs-Number of Steps 10-12    Alternate Level Stairs-Rails Right;Left    Additional Comments Has not tried stairs at this time; has moved bedroom downstairs at this time.      Prior Function   Level of Independence Independent    Vocation Full time employment    Designer, fashion/clothing; Lot of Computer Work  Cognition   Overall Cognitive Status Within Functional Limits for tasks assessed      Sensation   Light Touch Appears Intact                  Vestibular Assessment - 11/04/20 0001      Symptom Behavior   Subjective history of current problem Reports dizziness after he passed out and hit his head. Has had spinning sensation. Patient reports tinnitus. No vision changes noted.    Type of Dizziness  Vertigo;Spinning;Lightheadedness;Unsteady with head/body turns    Frequency of Dizziness Intermittent    Duration of Dizziness Short Duration (< 15 seconds)    Symptom Nature Motion provoked;Positional    Aggravating Factors Activity in general;Lying supine;Sitting with head tilted back;Turning head quickly    Relieving Factors Head stationary;Slow movements    Progression of Symptoms Better    History of similar episodes No prior history      Oculomotor Exam   Oculomotor Alignment Normal    Ocular ROM  WNL    Spontaneous Absent    Gaze-induced  Absent    Smooth Pursuits Intact    Saccades Intact      Positional Testing   Dix-Hallpike Dix-Hallpike Left;Dix-Hallpike Right      Dix-Hallpike Right   Dix-Hallpike Right Duration 10 seconds    Dix-Hallpike Right Symptoms Upbeat, right rotatory nystagmus      Dix-Hallpike Left   Dix-Hallpike Left Duration 5 seconds    Dix-Hallpike Left Symptoms Upbeat, left rotatory nystagmus              Objective measurements completed on examination: See above findings.        Vestibular Treatment/Exercise - 11/04/20 0001      Vestibular Treatment/Exercise   Vestibular Treatment Provided Canalith Repositioning    Canalith Repositioning Epley Manuever Right;Epley Manuever Left       EPLEY MANUEVER RIGHT   Number of Reps  2    Overall Response Symptoms Resolved    Response Details  no nystagmus/symptoms present       EPLEY MANUEVER LEFT   Number of Reps  2    Overall Response  Symptoms Resolved                 PT Education - 11/04/20 1045    Education Details Educated on [place]. BPPV Handout    Person(s) Educated Patient;Spouse    Methods Explanation    Comprehension Verbalized understanding            PT Short Term Goals - 11/04/20 1139      PT SHORT TERM GOAL #1   Title = LTGs             PT Long Term Goals - 11/04/20 1144      PT LONG TERM GOAL #1   Title Patient will report no symptoms with bed mobility and demonstrate negative positional testing to demosntrate resolution of BPPV. (All LTGs Due: 12/02/20)    Baseline see flowsheet (B BPPV)    Time 4    Period Weeks    Status New    Target Date 12/02/20      PT LONG TERM GOAL #2   Title Pt will demonstrate independence with final vestibular HEP    Baseline no HEP established    Time 4    Period Weeks    Status New                  Plan - 11/04/20  1139    Clinical Impression Statement Patient is a 65 y.o. male that was  referred to Neuro OPPT services for Vertigo. Patient was being treated for R BPPV after patient had a fall and sustained laceration on posterior head. Patient's PMH is significant for the following: PAF, Thoracic Aortic Aneursym, PFO, COVID. Patient presents with the following impairments: dizziness, decreased balance, and decreased activity tolerance. Upon evaluation patient present with R Upbeating Nytagmus of short duration demonstrtaing R Posterior Canal Canalithiasis, as well as L Upbeating Nystagmus of short duration demonstrating L Posterior Canal Canalithiasis. Completed treatment x 2 reps each with resolution of symptoms. PT educating on BPPV and handout provided. Patient will benefit from skilled PT services to address impairments stated above and return to PLOF.    Personal Factors and Comorbidities Comorbidity 2    Comorbidities PAF, Thoracic Aortic Aneursym, PFO, COVID    Examination-Activity Limitations Bed Mobility;Reach Overhead;Sleep    Examination-Participation Restrictions Occupation;Community Activity;Driving    Stability/Clinical Decision Making Stable/Uncomplicated    Clinical Decision Making Low    Rehab Potential Good    PT Frequency 1x / week    PT Duration 4 weeks    PT Treatment/Interventions ADLs/Self Care Home Management;Canalith Repostioning;Gait training;Stair training;Functional mobility training;Therapeutic activities;Therapeutic exercise;Balance training;Cognitive remediation;Patient/family education;Neuromuscular re-education;Vestibular;Passive range of motion;Manual techniques    PT Next Visit Plan Reassess B BPPV. Assess Horizontal Canal.    Consulted and Agree with Plan of Care Patient;Family member/caregiver    Family Member Consulted Wife           Patient will benefit from skilled therapeutic intervention in order to improve the following deficits and impairments:  Decreased balance,Dizziness,Difficulty walking  Visit Diagnosis: Dizziness and  giddiness  Unsteadiness on feet     Problem List Patient Active Problem List   Diagnosis Date Noted  . COVID   . Scalp laceration, initial encounter   . Syncope 10/26/2020  . Anticoagulated 06/08/2020  . PAF (paroxysmal atrial fibrillation) (HCC) 04/12/2020  . History of rheumatic fever   . Hyperglycemia 01/27/2013  . MTHFR mutation 01/20/2013  . Anxiety 08/02/2011  . Insomnia 08/02/2011  . Aneurysm of thoracic aorta (HCC) 08/16/2010  . ABDOMINAL ANEURYSM WITHOUT MENTION OF RUPTURE 01/10/2010  . Mild mitral insufficiency 08/29/2009  . INSOMNIA, CHRONIC 07/26/2008    Tempie Donning, PT, DPT 11/04/2020, 11:57 AM  [facility] [address] [place], [place], [address] Phone: [phone]   Fax:  [phone]  Name: Johnny [name] MRN: [id number] Date of Birth: 01-05-1956

## 2020-11-09 ENCOUNTER — Telehealth (INDEPENDENT_AMBULATORY_CARE_PROVIDER_SITE_OTHER): Payer: Medicare Other | Admitting: Nurse Practitioner

## 2020-11-09 ENCOUNTER — Other Ambulatory Visit: Payer: Self-pay

## 2020-11-09 DIAGNOSIS — R059 Cough, unspecified: Secondary | ICD-10-CM

## 2020-11-09 DIAGNOSIS — U071 COVID-19: Secondary | ICD-10-CM

## 2020-11-09 DIAGNOSIS — Z8616 Personal history of COVID-19: Secondary | ICD-10-CM

## 2020-11-09 NOTE — Patient Instructions (Signed)
Covid 19 Cough:   Stay well hydrated  Stay active  Deep breathing exercises  May take tylenol or fever or pain  May take mucinex twice daily    Follow up:  Follow up in 2 weeks or sooner if needed  

## 2020-11-09 NOTE — Progress Notes (Addendum)
Virtual Visit via Telephone Note  I connected with Johnny [name] on 11/14/20 at  8:30 AM EST by telephone and verified that I am speaking with the correct person using two identifiers.  Location: Patient: home Provider: office   I discussed the limitations, risks, security and privacy concerns of performing an evaluation and management service by telephone and the availability of in person appointments. I also discussed with the patient that there may be a patient responsible charge related to this service. The patient expressed understanding and agreed to proceed.  65 year old male with history of mitral regurgitation, patent foramen ovale, thoracic aortic aneurysm, a-fib  Recent hospital course:  Admitted 10/26/20 - 10/29/20  Johnny [name] a 65 y.o.malepresenting with syncopal episode. PMH is significant forparoxysmal atrial fibrillation.   Syncope  Fall  Head Laceration Requiring Repair Patient was shaving his face 2/9 when he experienced vision going in and out and proceeded to sit on the toilet to steady himself. He apparently loss consciousness and hit his head on the bathtub causing a 5cm laceration requiring 9 stitches. Wife reports no convulsions, did not lose bowel or urine control. Work up included ECG, CTA of the chest, CT of head and cervical spine, CXR, orthostatic vital signs, echocardiogram, carotid ultrasound, ethanol level, urine drug screen, urinalysis, TSH, and continuous cardiac monitoring which revealed positive orthostatic vital signs, EF 60-65%, known MVP with mitral regurg with flail posterior leaflet of mitral valve. PT/OT recommended vestibular rehab due to positive Dix-[place]. Discharged home with Zofran.    COVID-19+, stable Patient diagnosed with COVID 2/3, also the day of symptom onset. Patient is unvaccinated. Received azithromycin, dexamethasone, folate, hydroxyurea, and tessalon pearls on 2/5, completed 2/7. Inpatient treatment for COVID was not  initiated. Vital signs remained stable throughout admission except for two fevers (Tmax 101.42F) and patient did not require supplemental oxygen.   Atrial Fibrillation, stable Patient reports diagnosed 10 years ago, but intermittently symptomatic. Started Eliquis one year ago. Admission EKG showed sinus rhythm. Remained in sinus rhythm throughout admission without symptoms. Discuss rate control medications due to syncopal episode.He will continue Eliquis and Metoprolol on discharge.  Cardiology will follow up outpatient.  Thoracic Aortic Aneurysm, stable Follows with cardiology since diagnosis with routine imaging.     History of Present Illness:  Patient presents today for post COVID care clinic visit through televisit.  Please see above hospital course.  Patient states he has been stable since hospital discharge.  He has noticed some issues with working from home on the computer.  He does not notice some light sensitivity and headache.  We discussed that he should limit screen time for the next few weeks consider a recent fall and head laceration.  Patient has touch base with cardiology since being discharged from the hospital and does have an appointment set up for follow-up.  Patient has been working with physical therapist for vestibular rehab.  Patient does continue on Eliquis. Denies f/c/s, n/v/d, hemoptysis, PND, chest pain or edema.     Observations/Objective:  Vitals with BMI 10/29/2020 10/28/2020 10/28/2020  Height - - -  Weight 228 lbs 10 oz - -  BMI 29.34 - -  Systolic 117 113 315  Diastolic 74 70 70  Pulse 81 97 92     Assessment and Plan:  Covid 19 Cough:   Stay well hydrated  Stay active  Deep breathing exercises  May take tylenol or fever or pain  May take mucinex twice daily  Follow up:  Follow up in 2 weeks or sooner if needed     I discussed the assessment and treatment plan with the patient. The patient was provided an opportunity to ask  questions and all were answered. The patient agreed with the plan and demonstrated an understanding of the instructions.   The patient was advised to call back or seek an in-person evaluation if the symptoms worsen or if the condition fails to improve as anticipated.  I provided 22 minutes of non-face-to-face time during this encounter.   Johnny Andrew, NP

## 2020-11-10 ENCOUNTER — Ambulatory Visit (INDEPENDENT_AMBULATORY_CARE_PROVIDER_SITE_OTHER): Payer: Medicare Other | Admitting: Nurse Practitioner

## 2020-11-10 ENCOUNTER — Ambulatory Visit: Payer: Medicare Other

## 2020-11-10 DIAGNOSIS — Z8616 Personal history of COVID-19: Secondary | ICD-10-CM

## 2020-11-10 DIAGNOSIS — R2681 Unsteadiness on feet: Secondary | ICD-10-CM | POA: Diagnosis not present

## 2020-11-10 DIAGNOSIS — R42 Dizziness and giddiness: Secondary | ICD-10-CM | POA: Diagnosis not present

## 2020-11-10 DIAGNOSIS — H8113 Benign paroxysmal vertigo, bilateral: Secondary | ICD-10-CM | POA: Diagnosis not present

## 2020-11-10 NOTE — Progress Notes (Signed)
   [facility] Patient [facility] [address] [place], [place]  [address] Phone:  [phone]   Fax:  [phone]   New Patient Office Visit  Subjective:  Patient ID: ALDOUS HOUSEL, male    DOB: 1955-12-06  Age: 65 y.o. MRN: [id number]  CC: No chief complaint on file.   HPI JULIA KULZER presents to establish care however not seen.

## 2020-11-10 NOTE — Therapy (Signed)
[facility] [address] [place], [place], [address] Phone: [phone]   Fax:  [phone]  Physical Therapy Treatment  Patient Details  Name: Johnny [name] MRN: [id number] Date of Birth: 1956/01/09 Referring Provider (PT): Burley Saver, MD   Encounter Date: 11/10/2020   PT End of Session - 11/10/20 1118    Visit Number 2    Number of Visits 5    Date for PT Re-Evaluation 01/03/21   POC for 4 weeks, Cert for 60 days   Authorization Type UHC Medicare    PT Start Time 1115   patient arriving late   PT Stop Time 1145    PT Time Calculation (min) 30 min    Activity Tolerance Patient tolerated treatment well   nauseous during sensation   Behavior During Therapy [facility] for tasks assessed/performed           Past Medical History:  Diagnosis Date  . Atrial fibrillation (HCC)   . Chronic insomnia   . History of rheumatic fever    age 2  . Mitral regurgitation   . Patent foramen ovale   . Thoracic aortic aneurysm [facility])     Past Surgical History:  Procedure Laterality Date  . CARDIOVERSION      There were no vitals filed for this visit.   Subjective Assessment - 11/10/20 1118    Subjective Patient reports that he got the stitches out and has been doing good. Patient reports that the dizziness is better.    Patient is accompained by: Family member    Pertinent History PAF, Thoracic Aortic Aneursym, PFO    Limitations Other (comment)   Bed Mobility   Patient Stated Goals Resolve Vertigo    Currently in Pain? No/denies              Vestibular Assessment - 11/10/20 0001      Positional Testing   Dix-Hallpike Dix-Hallpike Left;Dix-Hallpike Right      Dix-Hallpike Right   Dix-Hallpike Right Duration 8 seconds    Dix-Hallpike Right Symptoms Upbeat, right rotatory nystagmus      Dix-Hallpike Left   Dix-Hallpike Left Duration None    Dix-Hallpike Left Symptoms Upbeat, left rotatory nystagmus;No nystagmus              Vestibular Treatment/Exercise - 11/10/20 0001      Vestibular Treatment/Exercise   Vestibular Treatment Provided Canalith Repositioning    Canalith Repositioning Epley Manuever Right       EPLEY MANUEVER RIGHT   Number of Reps  3    Overall Response Symptoms Resolved    Response Details  no nystagmus/symptoms present after last rep.               PT Short Term Goals - 11/04/20 1139      PT SHORT TERM GOAL #1   Title = LTGs             PT Long Term Goals - 11/04/20 1144      PT LONG TERM GOAL #1   Title Patient will report no symptoms with bed mobility and demonstrate negative positional testing to demosntrate resolution of BPPV. (All LTGs Due: 12/02/20)    Baseline see flowsheet (B BPPV)    Time 4    Period Weeks    Status New    Target Date 12/02/20      PT LONG TERM GOAL #2   Title Pt will demonstrate independence with final vestibular HEP    Baseline  no HEP established    Time 4    Period Weeks    Status New                 Plan - 11/10/20 1128    Clinical Impression Statement Completed reassesment of B BPPV, no nystagmus/symptoms with L Gilberto Better demonstrating continued resolution of L BPPV. With R Gilberto Better patient demonstrating short duration R Upbeating Nystagmus demonstrating R Posterior Canal Canalithiasis. Completed treatment x 3 reps with resolution of symptoms. Patient will continue to benefit from skilled PT services to progress toward all LTGs.    Personal Factors and Comorbidities Comorbidity 2    Comorbidities PAF, Thoracic Aortic Aneursym, PFO, COVID    Examination-Activity Limitations Bed Mobility;Reach Overhead;Sleep    Examination-Participation Restrictions Occupation;Community Activity;Driving    Stability/Clinical Decision Making Stable/Uncomplicated    Rehab Potential Good    PT Frequency 1x / week    PT Duration 4 weeks    PT Treatment/Interventions ADLs/Self Care Home Management;Canalith Repostioning;Gait  training;Stair training;Functional mobility training;Therapeutic activities;Therapeutic exercise;Balance training;Cognitive remediation;Patient/family education;Neuromuscular re-education;Vestibular;Passive range of motion;Manual techniques    PT Next Visit Plan Reassess R BPPV. Assess Horizontal Canal.    Consulted and Agree with Plan of Care Patient;Family member/caregiver    Family Member Consulted Wife           Patient will benefit from skilled therapeutic intervention in order to improve the following deficits and impairments:  Decreased balance,Dizziness,Difficulty walking  Visit Diagnosis: Dizziness and giddiness  Unsteadiness on feet  BPPV (benign paroxysmal positional vertigo), bilateral     Problem List Patient Active Problem List   Diagnosis Date Noted  . COVID   . Scalp laceration, initial encounter   . Syncope 10/26/2020  . Anticoagulated 06/08/2020  . PAF (paroxysmal atrial fibrillation) (HCC) 04/12/2020  . History of rheumatic fever   . Hyperglycemia 01/27/2013  . MTHFR mutation 01/20/2013  . Anxiety 08/02/2011  . Insomnia 08/02/2011  . Aneurysm of thoracic aorta (HCC) 08/16/2010  . ABDOMINAL ANEURYSM WITHOUT MENTION OF RUPTURE 01/10/2010  . Mild mitral insufficiency 08/29/2009  . INSOMNIA, CHRONIC 07/26/2008    Tempie Donning, PT, DPT 11/10/2020, 12:34 PM  [facility] [address] [place], [place], [address] Phone: [phone]   Fax:  [phone]  Name: Johnny [name] MRN: [id number] Date of Birth: Jun 21, 1956

## 2020-11-14 DIAGNOSIS — Z8616 Personal history of COVID-19: Secondary | ICD-10-CM | POA: Insufficient documentation

## 2020-11-15 ENCOUNTER — Telehealth: Payer: Self-pay | Admitting: Cardiology

## 2020-11-15 NOTE — Telephone Encounter (Signed)
Left message for patient to call and discuss scheduling the MRA chest and abdomen ordered by Dr. Jens Som

## 2020-11-17 ENCOUNTER — Other Ambulatory Visit: Payer: Self-pay

## 2020-11-17 ENCOUNTER — Ambulatory Visit: Payer: Medicare Other | Attending: Family Medicine

## 2020-11-17 DIAGNOSIS — H8113 Benign paroxysmal vertigo, bilateral: Secondary | ICD-10-CM | POA: Diagnosis not present

## 2020-11-17 DIAGNOSIS — R42 Dizziness and giddiness: Secondary | ICD-10-CM | POA: Diagnosis not present

## 2020-11-17 DIAGNOSIS — R2681 Unsteadiness on feet: Secondary | ICD-10-CM | POA: Insufficient documentation

## 2020-11-17 NOTE — Therapy (Signed)
[facility] [address] [place], [place], [address] Phone: [phone]   Fax:  [phone]  Physical Therapy Treatment  Patient Details  Name: Johnny [name] MRN: [id number] Date of Birth: [birth year] Referring Provider (PT): Burley Saver, MD   Encounter Date: 11/17/2020   PT End of Session - 11/17/20 1149    Visit Number 3    Number of Visits 5    Date for PT Re-Evaluation 01/03/21   POC for 4 weeks, Cert for 60 days   Authorization Type [employer] Medicare    PT Start Time 1146    PT Stop Time 1227    PT Time Calculation (min) 41 min    Activity Tolerance Patient tolerated treatment well   nauseous during sensation   Behavior During Therapy [facility] for tasks assessed/performed           Past Medical History:  Diagnosis Date  . Atrial fibrillation (HCC)   . Chronic insomnia   . History of rheumatic fever    age 65  . Mitral regurgitation   . Patent foramen ovale   . Thoracic aortic aneurysm [facility])     Past Surgical History:  Procedure Laterality Date  . CARDIOVERSION      There were no vitals filed for this visit.   Subjective Assessment - 11/17/20 1148    Subjective Patient reports continues to feel better; has started back into the office and is stil delaing with increased fatigue.    Patient is accompained by: Family member    Pertinent History PAF, Thoracic Aortic Aneursym, PFO    Limitations Other (comment)   Bed Mobility   Patient Stated Goals Resolve Vertigo    Currently in Pain? No/denies             Vestibular Assessment - 11/17/20 0001      Positional Testing   Dix-Hallpike Dix-Hallpike Right;Dix-Hallpike Left      Dix-Hallpike Right   Dix-Hallpike Right Duration 5 seconds    Dix-Hallpike Right Symptoms Upbeat, right rotatory nystagmus      Dix-Hallpike Left   Dix-Hallpike Left Duration None    Dix-Hallpike Left Symptoms No nystagmus              Vestibular Treatment/Exercise -  11/17/20 0001      Vestibular Treatment/Exercise   Vestibular Treatment Provided Canalith Repositioning    Canalith Repositioning Semont Procedure Right Posterior;Epley Manuever Right       EPLEY MANUEVER RIGHT   Number of Reps  2    Overall Response Symptoms Resolved    Response Details  no nystagmus/symptoms present after last rep      Semont Procedure Right Posterior   Number of Reps  2    Overall Response  Improved Symptoms                 PT Education - 11/17/20 1231    Education Details Educated on sleeping head elevated and on L Side    Person(s) Educated Patient    Methods Explanation    Comprehension Verbalized understanding            PT Short Term Goals - 11/04/20 1139      PT SHORT TERM GOAL #1   Title = LTGs             PT Long Term Goals - 11/04/20 1144      PT LONG TERM GOAL #1   Title Patient will report no symptoms with bed mobility  and demonstrate negative positional testing to demosntrate resolution of BPPV. (All LTGs Due: 12/02/20)    Baseline see flowsheet (B BPPV)    Time 4    Period Weeks    Status New    Target Date 12/02/20      PT LONG TERM GOAL #2   Title Pt will demonstrate independence with final vestibular HEP    Baseline no HEP established    Time 4    Period Weeks    Status New                 Plan - 11/17/20 1213    Clinical Impression Statement Completed reassesment of BPPV, patient continue to demo short duration R Upbeating Nystagmus demonstrating R Posterior Canal Canalithiasis. Compelted Semont x 2 reps, followed by Epley x 2 reps demonstrating resolution of symptoms. PT educating on sleeping with head elevated on on L side due to continued reoccurence rate of R BPPV. Will continue to progress toward all LTGs.    Personal Factors and Comorbidities Comorbidity 2    Comorbidities PAF, Thoracic Aortic Aneursym, PFO, COVID    Examination-Activity Limitations Bed Mobility;Reach Overhead;Sleep     Examination-Participation Restrictions Occupation;Community Activity;Driving    Stability/Clinical Decision Making Stable/Uncomplicated    Rehab Potential Good    PT Frequency 1x / week    PT Duration 4 weeks    PT Treatment/Interventions ADLs/Self Care Home Management;Canalith Repostioning;Gait training;Stair training;Functional mobility training;Therapeutic activities;Therapeutic exercise;Balance training;Cognitive remediation;Patient/family education;Neuromuscular re-education;Vestibular;Passive range of motion;Manual techniques    PT Next Visit Plan Reassess R BPPV. Assess Horizontal Canal.    Consulted and Agree with Plan of Care Patient;Family member/caregiver    Family Member Consulted Wife           Patient will benefit from skilled therapeutic intervention in order to improve the following deficits and impairments:  Decreased balance,Dizziness,Difficulty walking  Visit Diagnosis: Dizziness and giddiness  Unsteadiness on feet  BPPV (benign paroxysmal positional vertigo), bilateral     Problem List Patient Active Problem List   Diagnosis Date Noted  . History of COVID-19 11/14/2020  . COVID   . Scalp laceration, initial encounter   . Syncope 10/26/2020  . Anticoagulated 06/08/2020  . PAF (paroxysmal atrial fibrillation) (HCC) 04/12/2020  . History of rheumatic fever   . Hyperglycemia 01/27/2013  . MTHFR mutation 01/20/2013  . Anxiety 08/02/2011  . Insomnia 08/02/2011  . Aneurysm of thoracic aorta (HCC) 08/16/2010  . ABDOMINAL ANEURYSM WITHOUT MENTION OF RUPTURE 01/10/2010  . Mild mitral insufficiency 08/29/2009  . INSOMNIA, CHRONIC 07/26/2008    Johnny [name], PT, DPT 11/17/2020, 12:32 PM  [facility] [address] [place], [place], [address] Phone: [phone]   Fax:  [phone]  Name: Johnny [name] MRN: [id number] Date of Birth: 1956-05-19

## 2020-11-17 NOTE — Progress Notes (Deleted)
HPI: FU atrial fibrillation, thoracic aortic aneurysm and mitral regurgitation. He had a TEE-guided cardioversion on July 13, 2008. His TEE showed normal LV function. He had prolapse of the posterior mitral valve leaflet with moderate mitral regurgitation (2+). He also had a patent foramen ovale. Stress echocardiogram in January of 2011 was normal.Last echocardiogram January 2017 showed normal LV function, moderate left ventricular hypertrophy, dilated aortic root, bileaflet mitral valve prolapse with moderate mitral regurgitation. Abdominal ultrasound January 2017 showed abdominal aortic aneurysm measuring 3.3 cm. MRA August 2019 showed 4.9 cm dilated aortic root and dilated aortic arch measuring 3.8 cm. Echocardiogram February 2022 showed normal LV function, moderate left atrial enlargement, flail posterior mitral valve leaflet with moderate mitral regurgitation. Carotid Dopplers February 2022 showed no significant stenosis. CTA February 2022 showed no pulmonary embolus, thoracic aortic aneurysm measuring 4.9 cm.  Recently discharged following admission for syncope felt possibly vagal in etiology.  At that time also noted to be Covid positive.  Outpatient monitor recommended and also possible TEE to further evaluate mitral regurgitation.  Since he was last seen,  Current Outpatient Medications  Medication Sig Dispense Refill  . acetaminophen (TYLENOL) 325 MG tablet Take 2 tablets (650 mg total) by mouth every 6 (six) hours as needed for mild pain (or Fever >/= 101). 30 tablet 0  . diltiazem (CARDIZEM CD) 240 MG 24 hr capsule Take 1 capsule (240 mg total) by mouth daily. 90 capsule 3  . ELIQUIS 5 MG TABS tablet Take 1 tablet by mouth twice daily 180 tablet 1  . metoprolol succinate (TOPROL-XL) 25 MG 24 hr tablet Take 1 tablet (25 mg total) by mouth daily. 90 tablet 3  . ondansetron (ZOFRAN) 4 MG tablet Take 1 tablet (4 mg total) by mouth every 6 (six) hours as needed for nausea. (Patient not  taking: No sig reported) 10 tablet 0  . propranolol (INDERAL) 10 MG tablet Take 1 tablet (10 mg total) by mouth 4 (four) times daily as needed (atrial fib). 90 tablet 1  . VITAMIN D, CHOLECALCIFEROL, PO Take 1 capsule by mouth daily.    Marland Kitchen zinc gluconate 50 MG tablet Take 50 mg by mouth daily.     No current facility-administered medications for this visit.     Past Medical History:  Diagnosis Date  . Atrial fibrillation (HCC)   . Chronic insomnia   . History of rheumatic fever    age 65  . Mitral regurgitation   . Patent foramen ovale   . Thoracic aortic aneurysm [facility])     Past Surgical History:  Procedure Laterality Date  . CARDIOVERSION      Social History   Socioeconomic History  . Marital status: Married    Spouse name: Not on file  . Number of children: Not on file  . Years of education: Not on file  . Highest education level: Not on file  Occupational History  . Not on file  Tobacco Use  . Smoking status: Never Smoker  . Smokeless tobacco: Never Used  Substance and Sexual Activity  . Alcohol use: No  . Drug use: No  . Sexual activity: Not on file  Other Topics Concern  . Not on file  Social History Narrative   ** Merged History Encounter **       [employer]   Married   2 daughters ages 103, 40         Social Determinants of Health   Financial Resource Strain:  Not on file  Food Insecurity: Not on file  Transportation Needs: Not on file  Physical Activity: Not on file  Stress: Not on file  Social Connections: Not on file  Intimate Partner Violence: Not on file    Family History  Problem Relation Age of Onset  . Pulmonary fibrosis Mother   . Diverticulosis Father     ROS: no fevers or chills, productive cough, hemoptysis, dysphasia, odynophagia, melena, hematochezia, dysuria, hematuria, rash, seizure activity, orthopnea, PND, pedal edema, claudication. Remaining systems are negative.  Physical Exam: Well-developed  well-nourished in no acute distress.  Skin is warm and dry.  HEENT is normal.  Neck is supple.  Chest is clear to auscultation with normal expansion.  Cardiovascular exam is regular rate and rhythm.  Abdominal exam nontender or distended. No masses palpated. Extremities show no edema. neuro grossly intact  ECG- personally reviewed  A/P  1 thoracic aortic aneurysm-we will plan follow-up MRA February 2023.  Unchanged in size on recent CTA.  2 history of mitral valve prolapse with moderate mitral regurgitation-  3 abdominal aortic aneurysm-he needs a follow-up ultrasound and we will arrange.  4 history of paroxysmal atrial fibrillation-patient now with CHADSvasc 1.  I would therefore recommend anticoagulation.  Continue Cardizem for rate control if atrial fibrillation recurs.  5 recent syncopal episode-no recurrences.  We will arrange outpatient monitor.  Previous episode potentially vagal in etiology.  Olga Millers, MD

## 2020-11-18 ENCOUNTER — Ambulatory Visit: Payer: Medicare Other

## 2020-11-23 ENCOUNTER — Telehealth: Payer: Self-pay | Admitting: Nurse Practitioner

## 2020-11-23 ENCOUNTER — Telehealth (INDEPENDENT_AMBULATORY_CARE_PROVIDER_SITE_OTHER): Payer: Medicare Other | Admitting: Nurse Practitioner

## 2020-11-23 DIAGNOSIS — Z8616 Personal history of COVID-19: Secondary | ICD-10-CM

## 2020-11-23 NOTE — Progress Notes (Signed)
Virtual Visit via Telephone Note  I connected with Johnny [name] on 11/23/20 at  9:00 AM EST by telephone and verified that I am speaking with the correct person using two identifiers.  Location: Patient: home Provider: office   I discussed the limitations, risks, security and privacy concerns of performing an evaluation and management service by telephone and the availability of in person appointments. I also discussed with the patient that there may be a patient responsible charge related to this service. The patient expressed understanding and agreed to proceed.  65 year old male with history of mitral regurgitation, patent foramen ovale, thoracic aortic aneurysm, a-fib  Recent hospital course:  Admitted 10/26/20 - 10/29/20  Johnny [name] a 65 y.o.malepresenting with syncopal episode. PMH is significant forparoxysmal atrial fibrillation.   Syncope  Fall  Head Laceration Requiring Repair Patient was shaving his face 2/9 when he experienced vision going in and out and proceeded to sit on the toilet to steady himself. He apparently loss consciousness and hit his head on the bathtub causing a 5cm laceration requiring 9 stitches. Wife reports no convulsions, did not lose bowel or urine control. Work up included ECG, CTA of the chest, CT of head and cervical spine, CXR, orthostatic vital signs, echocardiogram, carotid ultrasound, ethanol level, urine drug screen, urinalysis, TSH, and continuous cardiac monitoring which revealed positive orthostatic vital signs, EF 60-65%, known MVP with mitral regurg with flail posterior leaflet of mitral valve.PT/OT recommended vestibular rehab due to positive Dix-Hall Pike. Discharged home with Zofran.  COVID-19+, stable Patient diagnosed with COVID 2/3, also the day of symptom onset. Patient is unvaccinated. Received azithromycin, dexamethasone, folate, hydroxyurea, and tessalon pearls on 2/5, completed 2/7. Inpatient treatment for COVID was not  initiated. Vital signs remained stable throughout admission except for two fevers (Tmax 101.51F) and patient did not require supplemental oxygen.   Atrial Fibrillation, stable Patient reports diagnosed 10 years ago, but intermittently symptomatic. Started Eliquis one year ago. Admission EKG showed sinus rhythm. Remained in sinus rhythm throughout admission without symptoms. Discuss rate control medications due to syncopal episode.He will continue Eliquis and Metoprolol on discharge. Cardiology will follow up outpatient.  Thoracic Aortic Aneurysm, stable Follows with cardiology since diagnosis with routine imaging.    History of Present Illness:  Patient presents today for post COVID care clinic visit through televisit.  This visit is a follow-up visit.  Patient was last seen through televisit on 11/09/2020.  Patient states that he has much improved.  He does have an upcoming appointment scheduled with a new PCP.  Patient states that his headaches are improving.  His appetite is improving.  He is being seen by cardiology and continues on Eliquis for A. fib.  He does have an MRA of the chest scheduled and a follow-up scheduled with cardiology in the near future.  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  Overall doing well. Denies f/c/s, n/v/d, hemoptysis, PND, chest pain or edema.    Observations/Objective:  Vitals with BMI 10/29/2020 10/28/2020 10/28/2020  Height - - -  Weight 228 lbs 10 oz - -  BMI 29.34 - -  Systolic 117 113 213  Diastolic 74 70 70  Pulse 81 97 92      Assessment and Plan:  Headache:   Stay well hydrated  Stay active  Deep breathing exercises  May take tylenol or fever or pain     Follow up:  Follow up in 4 weeks or  sooner if needed       I discussed the assessment and treatment plan with the patient. The patient was provided an opportunity to ask questions and all were answered. The patient agreed with the  plan and demonstrated an understanding of the instructions.   The patient was advised to call back or seek an in-person evaluation if the symptoms worsen or if the condition fails to improve as anticipated.  I provided 23 minutes of non-face-to-face time during this encounter.   Ivonne Andrew, NP

## 2020-11-23 NOTE — Patient Instructions (Addendum)
History of Covid 19 Headache:   Stay well hydrated  Stay active  Deep breathing exercises  May take tylenol or fever or pain     Follow up:  Follow up in 4 weeks or sooner if needed      Concussion, Adult  A concussion is a brain injury from a hard, direct hit (trauma) to your head or body. This direct hit causes your brain to quickly shake back and forth inside your skull. A concussion may also be called a mild traumatic brain injury (TBI). Healing from this injury can take time. What are the causes? This condition is caused by:  A direct hit to your head, such as: ? Running into a player during a game. ? Being hit in a fight. ? Hitting your head on a hard surface.  A quick and sudden movement of the head or neck, such as in a car crash. What are the signs or symptoms? The signs of a concussion can be hard to notice. They may be missed by you, family members, and doctors. You may look fine on the outside but may not act or feel normal. Physical symptoms  Headaches.  Being dizzy.  Problems with body balance.  Being sensitive to light or noise.  Vomiting or feeling like you may vomit.  Being tired.  Problems seeing or hearing.  Not sleeping or eating as you used to.  Seizure. Mental and emotional symptoms  Feeling grouchy (irritable).  Having mood changes.  Problems remembering things.  Trouble focusing your mind (concentrating), organizing, or making decisions.  Being slow to think, act, react, speak, or read.  Feeling worried or nervous (anxious).  Feeling sad (depressed). How is this treated? This condition may be treated by:  Stopping sports or activity if you are injured. If you hit your head or have signs of concussion: ? Do not return to sports or activities the same day. ? Get checked by a doctor before you return to your activities.  Resting your body and your mind.  Being watched carefully, often at home.  Medicines  to help with symptoms such as: ? Headaches. ? Feeling like you may vomit. ? Problems with sleep.  Avoiding alcohol and drugs.  Being asked to go to a concussion clinic or a place to help you recover (rehabilitation center). Recovery from a concussion can take time. Return to activities only:  When you are fully healed.  When your doctor says it is safe. Avoid taking strong pain medicines (opioids) for a concussion. Follow these instructions at home: Activity  Limit activities that need a lot of thought or focus, such as: ? Homework or work for your job. ? Watching TV. ? Using the computer or phone. ? Playing memory games and puzzles.  Rest. Rest helps your brain heal. Make sure you: ? Get plenty of sleep. Most adults should get 7-9 hours of sleep each night. ? Rest during the day. Take naps or breaks when you feel tired.  Avoid activity like exercise until your doctor says its safe. Stop any activity that makes symptoms worse.  Do not do activities that could cause a second concussion, such as riding a bike or playing sports.  Ask your doctor when you can return to your normal activities, such as school, work, sports, and driving. Your ability to react may be slower. Do not do these activities if you are dizzy. General instructions  Take over-the-counter and prescription medicines only as told by your doctor.  Do  not drink alcohol until your doctor says you can.  Watch your symptoms and tell other people to do the same. Other problems can occur after a concussion. Older adults have a higher risk of serious problems.  Tell your work Production designer, theatre/television/film, teachers, Tax adviser, school counselor, coach, or Event organiser about your injury and symptoms. Tell them about what you can or cannot do.  Keep all follow-up visits as told by your doctor. This is important.   How is this prevented? It is very important that you do not get another brain injury. In rare cases, another injury can  cause brain damage that will not go away, brain swelling, or death. The risk of this is greatest in the first 7-10 days after a head injury. To avoid injuries:  Stop activities that could lead to a second concussion, such as contact sports, until your doctor says it is okay.  When you return to sports or activities: ? Do not crash into other players. This is how most concussions happen. ? Follow the rules. ? Respect other players. Do not engage in violent behavior while playing.  Get regular exercise. Do strength and balance training.  Wear a helmet that fits you well during sports, biking, or other activities.  Helmets can help protect you from serious skull and brain injuries, but they do not protect you from a concussion. Even when wearing a helmet, you should avoid being hit in the head. Contact a doctor if:  Your symptoms do not get better.  You have new symptoms.  You have another injury. Get help right away if:  You have bad headaches or your headaches get worse.  You feel weak or numb in any part of your body.  You feel mixed up (confused).  Your balance gets worse.  You vomit often.  You feel more sleepy than normal.  You cannot speak well, or have slurred speech.  You have a seizure.  Others have trouble waking you up.  You have changes in how you act.  You have changes in how you see (vision).  You pass out (lose consciousness). These symptoms may be an emergency. Do not wait to see if the symptoms will go away. Get medical help right away. Call your local emergency services (911 in the [place] Do not drive yourself to the hospital. Summary  A concussion is a brain injury from a hard, direct hit (trauma) to your head or body.  This condition is treated with rest and careful watching of symptoms.  Ask your doctor when you can return to your normal activities, such as school, work, or driving.  Get help right away if you have a very bad headache, feel  weak in any part of your body, have a seizure, have changes in how you act or see, or if you are mixed up or more sleepy than normal. This information is not intended to replace advice given to you by your health care provider. Make sure you discuss any questions you have with your health care provider. Document Revised: 07/16/2019 Document Reviewed: 07/16/2019 Elsevier Patient Education  2021 [employer].

## 2020-11-23 NOTE — Telephone Encounter (Signed)
Attempted to contact patient via phone x2 attempts. No answer. Left voicemail.

## 2020-11-24 ENCOUNTER — Encounter: Payer: Self-pay | Admitting: Physical Therapy

## 2020-11-24 ENCOUNTER — Other Ambulatory Visit: Payer: Self-pay

## 2020-11-24 ENCOUNTER — Ambulatory Visit: Payer: Medicare Other | Admitting: Physical Therapy

## 2020-11-24 DIAGNOSIS — R42 Dizziness and giddiness: Secondary | ICD-10-CM | POA: Diagnosis not present

## 2020-11-24 DIAGNOSIS — R2681 Unsteadiness on feet: Secondary | ICD-10-CM

## 2020-11-24 DIAGNOSIS — H8113 Benign paroxysmal vertigo, bilateral: Secondary | ICD-10-CM

## 2020-11-25 ENCOUNTER — Ambulatory Visit: Payer: Medicare Other

## 2020-11-25 NOTE — Therapy (Signed)
[facility] [address] [place], [place], [address] Phone: [phone]   Fax:  [phone]  Physical Therapy Treatment  Patient Details  Name: Johnny [name] MRN: [id number] Date of Birth: Apr 27, 1956 Referring Provider (PT): Burley Saver, MD   Encounter Date: 11/24/2020   PT End of Session - 11/24/20 1108    Visit Number 4    Number of Visits 5    Date for PT Re-Evaluation 01/03/21   POC for 4 weeks, Cert for 60 days   Authorization Type [employer] Medicare    PT Start Time 1103    PT Stop Time 1130   ended early due to resolution of symptoms, not wanting to excerbate them again   PT Time Calculation (min) 27 min    Activity Tolerance Patient tolerated treatment well    Behavior During Therapy [facility] for tasks assessed/performed           Past Medical History:  Diagnosis Date  . Atrial fibrillation (HCC)   . Chronic insomnia   . History of rheumatic fever    age 65  . Mitral regurgitation   . Patent foramen ovale   . Thoracic aortic aneurysm [facility])     Past Surgical History:  Procedure Laterality Date  . CARDIOVERSION      There were no vitals filed for this visit.   Subjective Assessment - 11/24/20 1107    Subjective Pt feels the progress has been steady, wishes it was going faster. Did sleep on left side and back, feel this is helping "it was positive".  Feels the vertigo has improved, just not resolved. Does report he is now able to read on his laptop without nausea/vertigo, however his neck gets tired limiting how long he can go for.    Pertinent History PAF, Thoracic Aortic Aneursym, PFO    Limitations Other (comment)   bed mobility   Patient Stated Goals Resolve Vertigo    Currently in Pain? No/denies    Pain Score 0-No pain               Vestibular Assessment - 11/24/20 1114      Dix-Hallpike Right   Dix-Hallpike Right Duration 3 seconds    Dix-Hallpike Right Symptoms Upbeat, right rotatory  nystagmus                Vestibular Treatment/Exercise - 11/24/20 1114      Vestibular Treatment/Exercise   Vestibular Treatment Provided Canalith Repositioning    Canalith Repositioning Semont Procedure Right Posterior;Epley Manuever Right       EPLEY MANUEVER RIGHT   Number of Reps  2    Overall Response Symptoms Resolved    Response Details  first rep nystagmus/syptoms present that resoved in less than 5 seconds. no nystagmus/symptoms with 2cd rep.      Semont Procedure Right Posterior   Number of Reps  2    Overall Response  Improved Symptoms                   PT Short Term Goals - 11/04/20 1139      PT SHORT TERM GOAL #1   Title = LTGs             PT Long Term Goals - 11/04/20 1144      PT LONG TERM GOAL #1   Title Patient will report no symptoms with bed mobility and demonstrate negative positional testing to demosntrate resolution of BPPV. (All LTGs Due: 12/02/20)  Baseline see flowsheet (B BPPV)    Time 4    Period Weeks    Status New    Target Date 12/02/20      PT LONG TERM GOAL #2   Title Pt will demonstrate independence with final vestibular HEP    Baseline no HEP established    Time 4    Period Weeks    Status New                 Plan - 11/24/20 1109    Clinical Impression Statement Pt continued to present with BPPV demo'ing short duration of right Upbeating Nystagmus consistent with past two sessions. Continued with treatment plan from previous session with Semont x2, then Epley x2 reps with resolution of symptoms. Pt needed less time with each maneuver today for symptoms to resolve (3-5 seconds when syptoms present). Will continue to progress toward LTGs.    Personal Factors and Comorbidities Comorbidity 2    Comorbidities PAF, Thoracic Aortic Aneursym, PFO, COVID    Examination-Activity Limitations Bed Mobility;Reach Overhead;Sleep    Examination-Participation Restrictions Occupation;Community Activity;Driving     Stability/Clinical Decision Making Stable/Uncomplicated    Rehab Potential Good    PT Frequency 1x / week    PT Duration 4 weeks    PT Treatment/Interventions ADLs/Self Care Home Management;Canalith Repostioning;Gait training;Stair training;Functional mobility training;Therapeutic activities;Therapeutic exercise;Balance training;Cognitive remediation;Patient/family education;Neuromuscular re-education;Vestibular;Passive range of motion;Manual techniques    PT Next Visit Plan Reassess R BPPV. Assess Horizontal Canal.    Consulted and Agree with Plan of Care Patient;Family member/caregiver    Family Member Consulted Wife           Patient will benefit from skilled therapeutic intervention in order to improve the following deficits and impairments:  Decreased balance,Dizziness,Difficulty walking  Visit Diagnosis: Dizziness and giddiness  Unsteadiness on feet  BPPV (benign paroxysmal positional vertigo), bilateral     Problem List Patient Active Problem List   Diagnosis Date Noted  . History of COVID-19 11/14/2020  . COVID   . Scalp laceration, initial encounter   . Syncope 10/26/2020  . Anticoagulated 06/08/2020  . PAF (paroxysmal atrial fibrillation) (HCC) 04/12/2020  . History of rheumatic fever   . Hyperglycemia 01/27/2013  . MTHFR mutation 01/20/2013  . Anxiety 08/02/2011  . Insomnia 08/02/2011  . Aneurysm of thoracic aorta (HCC) 08/16/2010  . ABDOMINAL ANEURYSM WITHOUT MENTION OF RUPTURE 01/10/2010  . Mild mitral insufficiency 08/29/2009  . INSOMNIA, CHRONIC 07/26/2008    Sallyanne Kuster, PTA, [facility] [address] [place], [place] [address] [phone] 11/25/20, 11:15 AM   Name: VEDDER BRITTIAN MRN: [id number] Date of Birth: [birth year]

## 2020-11-30 ENCOUNTER — Ambulatory Visit: Payer: Medicare Other | Admitting: Cardiology

## 2020-12-02 ENCOUNTER — Ambulatory Visit: Payer: Medicare Other

## 2020-12-02 ENCOUNTER — Other Ambulatory Visit: Payer: Self-pay

## 2020-12-02 DIAGNOSIS — R42 Dizziness and giddiness: Secondary | ICD-10-CM

## 2020-12-02 DIAGNOSIS — R2681 Unsteadiness on feet: Secondary | ICD-10-CM

## 2020-12-02 DIAGNOSIS — H8113 Benign paroxysmal vertigo, bilateral: Secondary | ICD-10-CM | POA: Diagnosis not present

## 2020-12-02 NOTE — Patient Instructions (Signed)
Epley Maneuver    Begin in a sitting upright position on a table or bed with a pillow behind you. Turn your head about 45 degrees to the right. Keeping your head at the 45 degree angle, quickly lie down onto the table or bed, so that your shoulders are touching the top of the pillow. You may experience dizziness in this position. Wait until the dizziness goes away. Then quickly turn your head so it is facing about 45 degrees to the left. Wait again until any dizziness goes away. Keeping your head at the same angle, turn your body so you are lying on your left side and wait again until you have no symptoms. Finally, sit upright.

## 2020-12-02 NOTE — Therapy (Signed)
[facility] [address] [place], [place], [address] Phone: [phone]   Fax:  [phone]  Physical Therapy Treatment/Discharge Summary  Patient Details  Name: Johnny [name] MRN: [id number] Date of Birth: September 26, 1955 Referring Provider (PT): Yehuda Savannah, MD  PHYSICAL THERAPY DISCHARGE SUMMARY  Visits from Start of Care: 5  Current functional level related to goals / functional outcomes: See Clinical Impression Statement   Remaining deficits: None   Education / Equipment: HEP (See below)   Plan: Patient agrees to discharge.  Patient goals were met. Patient is being discharged due to meeting the stated rehab goals.  ?????       Encounter Date: 12/02/2020   PT End of Session - 12/02/20 1102    Visit Number 5    Number of Visits 5    Date for PT Re-Evaluation 01/03/21   POC for 4 weeks, Cert for 60 days   Authorization Type [employer] Medicare    PT Start Time 1102    PT Stop Time 1128    PT Time Calculation (min) 26 min    Activity Tolerance Patient tolerated treatment well    Behavior During Therapy WFL for tasks assessed/performed           Past Medical History:  Diagnosis Date  . Atrial fibrillation ([place])   . Chronic insomnia   . History of rheumatic fever    age 65  . Mitral regurgitation   . Patent foramen ovale   . Thoracic aortic aneurysm [facility])     Past Surgical History:  Procedure Laterality Date  . CARDIOVERSION      There were no vitals filed for this visit.   Subjective Assessment - 12/02/20 1105    Subjective Patient reports that he felt worse after last session, but that the next day felt better. Reports he has not had any episodes of spinning recently. Has recently began to sleep on R side.    Patient is accompained by: Family member   Wife   Pertinent History PAF, Thoracic Aortic Aneursym, PFO    Limitations Other (comment)   bed mobility   Patient Stated Goals Resolve Vertigo     Currently in Pain? No/denies               Vestibular Assessment - 12/02/20 0001      Positional Testing   Dix-Hallpike Dix-Hallpike Right;Dix-Hallpike Left    Horizontal Canal Testing Horizontal Canal Right;Horizontal Canal Left      Dix-Hallpike Right   Dix-Hallpike Right Duration 0    Dix-Hallpike Right Symptoms No nystagmus      Dix-Hallpike Left   Dix-Hallpike Left Duration 0    Dix-Hallpike Left Symptoms No nystagmus      Horizontal Canal Right   Horizontal Canal Right Duration 0    Horizontal Canal Right Symptoms Normal      Horizontal Canal Left   Horizontal Canal Left Duration 0    Horizontal Canal Left Symptoms Normal               [facility] Adult PT Treatment/Exercise - 12/02/20 1131      Therapeutic Activites    Therapeutic Activities Other Therapeutic Activities    Other Therapeutic Activities PT educating on self epley manuever for R side. Handout provided. PT walked patient through manuever and then patient demonstrated to ensure proper completion. PT addressed any questions/concerns of patient.           Epley Maneuver    Begin in  a sitting upright position on a table or bed with a pillow behind you. Turn your head about 45 degrees to the right. Keeping your head at the 45 degree angle, quickly lie down onto the table or bed, so that your shoulders are touching the top of the pillow. You may experience dizziness in this position. Wait until the dizziness goes away. Then quickly turn your head so it is facing about 45 degrees to the left. Wait again until any dizziness goes away. Keeping your head at the same angle, turn your body so you are lying on your left side and wait again until you have no symptoms. Finally, sit upright.         PT Education - 12/02/20 1123    Education Details educated on progress toward LTG;; self epley manuever    Person(s) Educated Patient    Methods Explanation;Demonstration;Handout    Comprehension Verbalized  understanding;Returned demonstration            PT Short Term Goals - 11/04/20 1139      PT SHORT TERM GOAL #1   Title = LTGs             PT Long Term Goals - 12/02/20 1128      PT LONG TERM GOAL #1   Title Patient will report no symptoms with bed mobility and demonstrate negative positional testing to demosntrate resolution of BPPV. (All LTGs Due: 12/02/20)    Baseline see flowsheet (B BPPV); BPPV resolved    Time 4    Period Weeks    Status Achieved      PT LONG TERM GOAL #2   Title Pt will demonstrate independence with final vestibular HEP    Baseline home epley manuever provided    Time 4    Period Weeks    Status Achieved                 Plan - 12/02/20 1132    Clinical Impression Statement Completed reassesment of BPPV today, with no nystagmus/symptoms reported. Assessed All canals of bilateral ears with no issue demonstrating resolution of BPPV. PT educating on self epley manuever for trial if have reoocurence upon d/c. Handout provided. Patient able to meet all LTGs goals demonstrating readiness for discharge, with patient verbalizing agreement.    Personal Factors and Comorbidities Comorbidity 2    Comorbidities PAF, Thoracic Aortic Aneursym, PFO, COVID    Examination-Activity Limitations Bed Mobility;Reach Overhead;Sleep    Examination-Participation Restrictions Occupation;Community Activity;Driving    Stability/Clinical Decision Making Stable/Uncomplicated    Rehab Potential Good    PT Frequency 1x / week    PT Duration 4 weeks    PT Treatment/Interventions ADLs/Self Care Home Management;Canalith Repostioning;Gait training;Stair training;Functional mobility training;Therapeutic activities;Therapeutic exercise;Balance training;Cognitive remediation;Patient/family education;Neuromuscular re-education;Vestibular;Passive range of motion;Manual techniques    Consulted and Agree with Plan of Care Patient;Family member/caregiver    Family Member Consulted Wife            Patient will benefit from skilled therapeutic intervention in order to improve the following deficits and impairments:  Decreased balance,Dizziness,Difficulty walking  Visit Diagnosis: Dizziness and giddiness  Unsteadiness on feet  BPPV (benign paroxysmal positional vertigo), bilateral     Problem List Patient Active Problem List   Diagnosis Date Noted  . History of COVID-19 11/14/2020  . COVID   . Scalp laceration, initial encounter   . Syncope 10/26/2020  . Anticoagulated 06/08/2020  . PAF (paroxysmal atrial fibrillation) ([place]) 04/12/2020  . History of rheumatic fever   .  Hyperglycemia 01/27/2013  . MTHFR mutation 01/20/2013  . Anxiety 08/02/2011  . Insomnia 08/02/2011  . Aneurysm of thoracic aorta ([place]) 08/16/2010  . ABDOMINAL ANEURYSM WITHOUT MENTION OF RUPTURE 01/10/2010  . Mild mitral insufficiency 08/29/2009  . INSOMNIA, CHRONIC 07/26/2008    Jones Bales, PT, DPT 12/02/2020, 11:34 AM  [facility] [address] [place], [place], [address] Phone: [phone]   Fax:  [phone]  Name: JOE GEE MRN: [id number] Date of Birth: 11-02-1955

## 2020-12-06 NOTE — Progress Notes (Signed)
HPI: FU atrial fibrillation, thoracic aortic aneurysm and mitral regurgitation. He had a TEE-guided cardioversion on July 13, 2008. His TEE showed normal LV function. He had prolapse of the posterior mitral valve leaflet with moderate mitral regurgitation (2+). He also had a patent foramen ovale. Stress echocardiogram in January of 2011 was normal.Abdominal ultrasound January 2017 showed abdominal aortic aneurysm measuring 3.3 cm. MRA August 2019 showed 4.9 cm dilated aortic root and dilated aortic arch measuring 3.8 cm. Echocardiogram February 2022 showed normal LV function, moderate left atrial enlargement, flail posterior mitral valve leaflet with moderate mitral regurgitation. Carotid Dopplers February 2022 showed no significant stenosis. CTA February 2022 showed no pulmonary embolus, thoracic aortic aneurysm measuring 4.9 cm.  Recently discharged following admission for syncope felt possibly vagal in etiology.  At that time also noted to be Covid positive.  Outpatient monitor recommended and also possible TEE to further evaluate mitral regurgitation.  Follow-up MRA March 2022 showed 4.7 to 4.8 cm thoracic aortic aneurysm at the sinuses of Valsalva.  Since he was last seen,he denies dyspnea, chest pain, palpitations, syncope or bleeding.  Current Outpatient Medications  Medication Sig Dispense Refill  . acetaminophen (TYLENOL) 325 MG tablet Take 2 tablets (650 mg total) by mouth every 6 (six) hours as needed for mild pain (or Fever >/= 101). 30 tablet 0  . ELIQUIS 5 MG TABS tablet Take 1 tablet by mouth twice daily 180 tablet 1  . metoprolol succinate (TOPROL-XL) 25 MG 24 hr tablet Take 1 tablet (25 mg total) by mouth daily. 90 tablet 3  . ondansetron (ZOFRAN) 4 MG tablet Take 1 tablet (4 mg total) by mouth every 6 (six) hours as needed for nausea. 10 tablet 0  . propranolol (INDERAL) 10 MG tablet Take 1 tablet (10 mg total) by mouth 4 (four) times daily as needed (atrial fib). 90 tablet  1  . VITAMIN D, CHOLECALCIFEROL, PO Take 1 capsule by mouth daily.    Marland Kitchen zinc gluconate 50 MG tablet Take 50 mg by mouth daily.    Marland Kitchen diltiazem (CARDIZEM CD) 240 MG 24 hr capsule Take 1 capsule (240 mg total) by mouth daily. (Patient not taking: Reported on 12/12/2020) 90 capsule 3   No current facility-administered medications for this visit.     Past Medical History:  Diagnosis Date  . Atrial fibrillation (HCC)   . Chronic insomnia   . History of rheumatic fever    age 65  . Mitral regurgitation   . Patent foramen ovale   . Thoracic aortic aneurysm [facility])     Past Surgical History:  Procedure Laterality Date  . CARDIOVERSION      Social History   Socioeconomic History  . Marital status: Married    Spouse name: Not on file  . Number of children: Not on file  . Years of education: Not on file  . Highest education level: Not on file  Occupational History  . Not on file  Tobacco Use  . Smoking status: Never Smoker  . Smokeless tobacco: Never Used  Substance and Sexual Activity  . Alcohol use: No  . Drug use: No  . Sexual activity: Not on file  Other Topics Concern  . Not on file  Social History Narrative   ** Merged History Encounter **       [employer]   Married   2 daughters ages 46, 77         Social Determinants of Health  Financial Resource Strain: Not on file  Food Insecurity: Not on file  Transportation Needs: Not on file  Physical Activity: Not on file  Stress: Not on file  Social Connections: Not on file  Intimate Partner Violence: Not on file    Family History  Problem Relation Age of Onset  . Pulmonary fibrosis Mother   . Diverticulosis Father     ROS: no fevers or chills, productive cough, hemoptysis, dysphasia, odynophagia, melena, hematochezia, dysuria, hematuria, rash, seizure activity, orthopnea, PND, pedal edema, claudication. Remaining systems are negative.  Physical Exam: Well-developed well-nourished in no acute  distress.  Skin is warm and dry.  HEENT is normal.  Neck is supple.  Chest is clear to auscultation with normal expansion.  Cardiovascular exam is regular rate and rhythm.  2/6 systolic murmur apex. Abdominal exam nontender or distended. No masses palpated. Extremities show no edema. neuro grossly intact  ECG- personally reviewed  A/P  1 thoracic aortic aneurysm-we will plan follow-up MRA February 2023.    2 history of mitral valve prolapse with moderate mitral regurgitation-patient was felt to have significant mitral regurgitation on recent transthoracic echocardiogram associated with prolapse of posterior mitral valve leaflet.  We will arrange a transesophageal echocardiogram to further assess.  If severe may need to consider early repair.  3 abdominal aortic aneurysm-not noted on recent MRA.  4 history of paroxysmal atrial fibrillation-patient now with CHADSvasc 1.  Continue Toprol and apixaban.  5 recent syncopal episode-no recurrences.  Occurred in the setting of Covid infection.  Possibly dehydration with vagal contribution.  He has had no recurrences.  We will consider monitoring the future if his symptoms worsen or recur.  Olga Millers, MD

## 2020-12-10 ENCOUNTER — Ambulatory Visit
Admission: RE | Admit: 2020-12-10 | Discharge: 2020-12-10 | Disposition: A | Discharge status: Home / Self Care | Payer: Medicare Other | Source: Ambulatory Visit | Attending: Cardiology | Admitting: Cardiology

## 2020-12-10 ENCOUNTER — Other Ambulatory Visit: Payer: Self-pay

## 2020-12-10 DIAGNOSIS — I714 Abdominal aortic aneurysm, without rupture, unspecified: Secondary | ICD-10-CM

## 2020-12-10 DIAGNOSIS — I712 Thoracic aortic aneurysm, without rupture, unspecified: Secondary | ICD-10-CM

## 2020-12-10 DIAGNOSIS — R1907 Generalized intra-abdominal and pelvic swelling, mass and lump: Secondary | ICD-10-CM

## 2020-12-10 MED ORDER — GADOBENATE DIMEGLUMINE 529 MG/ML IV SOLN
20.0000 mL | Freq: Once | INTRAVENOUS | Status: AC | PRN
  Administered 2020-12-10: 20 mL via INTRAVENOUS

## 2020-12-12 ENCOUNTER — Encounter: Payer: Self-pay | Admitting: Cardiology

## 2020-12-12 ENCOUNTER — Other Ambulatory Visit: Payer: Self-pay

## 2020-12-12 ENCOUNTER — Other Ambulatory Visit: Payer: Self-pay | Admitting: *Deleted

## 2020-12-12 ENCOUNTER — Ambulatory Visit: Payer: Medicare Other | Admitting: Cardiology

## 2020-12-12 VITALS — BP 106/70 | HR 90 | Ht 74.0 in | Wt 228.0 lb

## 2020-12-12 DIAGNOSIS — I712 Thoracic aortic aneurysm, without rupture, unspecified: Secondary | ICD-10-CM

## 2020-12-12 DIAGNOSIS — I340 Nonrheumatic mitral (valve) insufficiency: Secondary | ICD-10-CM

## 2020-12-12 DIAGNOSIS — I480 Paroxysmal atrial fibrillation: Secondary | ICD-10-CM | POA: Diagnosis not present

## 2020-12-12 DIAGNOSIS — R55 Syncope and collapse: Secondary | ICD-10-CM | POA: Diagnosis not present

## 2020-12-12 NOTE — Patient Instructions (Signed)
  Testing/Procedures:   You are scheduled for a TEE on Monday 01-16-21 with Dr. Jens Som.  Please arrive at the [facility] (Main Entrance A) at [facility]: [address] [place], [place] [address] at 7:30 am. (1 hour prior to procedure unless lab work is needed; if lab work is needed arrive 1.5 hours ahead)  DIET: Nothing to eat or drink after midnight except a sip of water with medications (see medication instructions below)  You must have a responsible person to drive you home and stay in the waiting area during your procedure. Failure to do so could result in cancellation.  Bring your insurance cards.  *Special Note: Every effort is made to have your procedure done on time. Occasionally there are emergencies that occur at the hospital that may cause delays. Please be patient if a delay does occur.    Follow-Up: At [facility], you and your health needs are our priority.  As part of our continuing mission to provide you with exceptional heart care, we have created designated Provider Care Teams.  These Care Teams include your primary Cardiologist (physician) and Advanced Practice Providers (APPs -  Physician Assistants and Nurse Practitioners) who all work together to provide you with the care you need, when you need it.  We recommend signing up for the patient portal called "MyChart".  Sign up information is provided on this After Visit Summary.  MyChart is used to connect with patients for Virtual Visits (Telemedicine).  Patients are able to view lab/test results, encounter notes, upcoming appointments, etc.  Non-urgent messages can be sent to your provider as well.   To learn more about what you can do with MyChart, go to [email or web address].

## 2021-01-09 NOTE — Progress Notes (Addendum)
HPI: FU atrial fibrillation, thoracic aortic aneurysm and mitral regurgitation. He had a TEE-guided cardioversion on July 13, 2008. His TEE showed normal LV function. He had prolapse of the posterior mitral valve leaflet with moderate mitral regurgitation (2+). He also had a patent foramen ovale. Abdominal ultrasound January 2017 showed abdominal aortic aneurysm measuring 3.3 cm.MRA August 2019 showed 4.9 cm dilated aortic root and dilated aortic arch measuring 3.8 cm. Echocardiogram February 2022 showed normal LV function, moderate left atrial enlargement, flail posterior mitral valve leaflet with moderate mitral regurgitation.Carotid Dopplers February 2022 showed no significant stenosis. CTA February 2022 showed no pulmonary embolus, thoracic aortic aneurysm measuring 4.9 cm. Recently discharged following admission for syncope felt possibly vagal in etiology.  At that time also noted to be Covid positive. Follow-up MRA March 2022 showed 4.7 to 4.8 cm thoracic aortic aneurysm at the sinuses of Valsalva; however no abdominal aortic aneurysm noted. Transesophageal echocardiogram May 2022 showed normal LV function, mildly dilated aortic root at 44 mm, flail P2 segment of posterior mitral valve leaflet with severe eccentric mitral regurgitation, moderate left atrial enlargement. Since he was last seen,patient denies dyspnea, chest pain, palpitations or syncope.  Current Outpatient Medications  Medication Sig Dispense Refill  . acetaminophen (TYLENOL) 325 MG tablet Take 2 tablets (650 mg total) by mouth every 6 (six) hours as needed for mild pain (or Fever >/= 101). 30 tablet 0  . Cholecalciferol (VITAMIN D3) 250 MCG (10000 UT) capsule Take 10,000 Units by mouth in the morning.    Marland Kitchen ELIQUIS 5 MG TABS tablet Take 1 tablet by mouth twice daily (Patient taking differently: Take 5 mg by mouth in the morning.) 180 tablet 1  . Hydroxocobalamin Acetate 1000 MCG/ML SOLN Inject 1,000 mcg into the muscle  every other day.    Marland Kitchen MAGNESIUM PO Take 1 tablet by mouth daily.    . metoprolol succinate (TOPROL-XL) 25 MG 24 hr tablet Take 1 tablet (25 mg total) by mouth daily. (Patient taking differently: Take 25 mg by mouth in the morning.) 90 tablet 3  . Multiple Vitamin (MULTIVITAMIN WITH MINERALS) TABS tablet Take 1 tablet by mouth in the morning.    . propranolol (INDERAL) 10 MG tablet Take 1 tablet (10 mg total) by mouth 4 (four) times daily as needed (atrial fib). 90 tablet 1  . Zinc 50 MG TABS Take 50 mg by mouth daily.     No current facility-administered medications for this visit.     Past Medical History:  Diagnosis Date  . Atrial fibrillation (HCC)   . Chronic insomnia   . History of rheumatic fever    age 65  . Mitral regurgitation   . Patent foramen ovale   . Thoracic aortic aneurysm [facility])     Past Surgical History:  Procedure Laterality Date  . CARDIOVERSION    . TEE WITHOUT CARDIOVERSION N/A 01/16/2021   Procedure: TRANSESOPHAGEAL ECHOCARDIOGRAM (TEE);  Surgeon: Lewayne Bunting, MD;  Location: [facility] ENDOSCOPY;  Service: Cardiovascular;  Laterality: N/A;    Social History   Socioeconomic History  . Marital status: Married    Spouse name: Not on file  . Number of children: Not on file  . Years of education: Not on file  . Highest education level: Not on file  Occupational History  . Not on file  Tobacco Use  . Smoking status: Never Smoker  . Smokeless tobacco: Never Used  Substance and Sexual Activity  . Alcohol use: No  . Drug  use: No  . Sexual activity: Not on file  Other Topics Concern  . Not on file  Social History Narrative   ** Merged History Encounter **       [employer]   Married   2 daughters ages 37, 48         Social Determinants of Health   Financial Resource Strain: Not on file  Food Insecurity: Not on file  Transportation Needs: Not on file  Physical Activity: Not on file  Stress: Not on file  Social Connections: Not on  file  Intimate Partner Violence: Not on file    Family History  Problem Relation Age of Onset  . Pulmonary fibrosis Mother   . Diverticulosis Father     ROS: no fevers or chills, productive cough, hemoptysis, dysphasia, odynophagia, melena, hematochezia, dysuria, hematuria, rash, seizure activity, orthopnea, PND, pedal edema, claudication. Remaining systems are negative.  Physical Exam: Well-developed well-nourished in no acute distress.  Skin is warm and dry.  HEENT is normal.  Neck is supple.  Chest is clear to auscultation with normal expansion.  Cardiovascular exam is regular rate and rhythm.  2/6 systolic murmur apex. Abdominal exam nontender or distended. No masses palpated. Extremities show no edema. neuro grossly intact   A/P  1 mitral valve prolapse/mitral regurgitation-transesophageal echocardiogram revealed flail P2 segment of posterior mitral valve leaflet with probable ruptured cord.  Mitral regurgitation difficult to quantitate due to eccentric nature of the jet but appears to be severe.  Patient also with moderate left atrial enlargement and history of atrial fibrillation.  I have discussed/recommended mitral valve repair/maze procedure with him today. He would like to discuss this with Dr. Cornelius Moras including timing of surgery (Dr Orvan July pending departure) and risk/benefits.  We can arrange right and left cardiac catheterization once surgery is scheduled.  2 thoracic aortic aneurysm-patient will need follow-up MRA February 2023.  Not clear to me that he will require aortic root replacement at time of surgery as this dilatation has been stable since 2014.  Will review with Dr. Cornelius Moras.  3 paroxysmal atrial fibrillation-continue Toprol (patient remains in sinus rhythm).  Continue apixaban.  4 previous syncopal episode-no recurrences and occurred in the setting of COVID infection.  This is felt likely secondary to dehydration with possible vagal component as well.  Consider  monitor in the future if he has recurrences.  Olga Millers, MD

## 2021-01-12 ENCOUNTER — Ambulatory Visit: Payer: Medicare Other | Admitting: Cardiology

## 2021-01-16 ENCOUNTER — Ambulatory Visit (HOSPITAL_COMMUNITY): Payer: Medicare Other | Admitting: Certified Registered Nurse Anesthetist

## 2021-01-16 ENCOUNTER — Ambulatory Visit (HOSPITAL_COMMUNITY)
Admission: RE | Admit: 2021-01-16 | Discharge: 2021-01-16 | Disposition: A | Discharge status: Home / Self Care | Payer: Medicare Other | Attending: Cardiology | Admitting: Cardiology

## 2021-01-16 ENCOUNTER — Encounter (HOSPITAL_COMMUNITY): Payer: Self-pay | Admitting: Cardiology

## 2021-01-16 ENCOUNTER — Encounter (HOSPITAL_COMMUNITY)
Admission: RE | Disposition: A | Discharge status: Home / Self Care | Payer: Self-pay | Source: Home / Self Care | Attending: Cardiology

## 2021-01-16 ENCOUNTER — Ambulatory Visit (HOSPITAL_COMMUNITY): Payer: Medicare Other | Attending: Cardiology

## 2021-01-16 DIAGNOSIS — Z79899 Other long term (current) drug therapy: Secondary | ICD-10-CM | POA: Insufficient documentation

## 2021-01-16 DIAGNOSIS — I4891 Unspecified atrial fibrillation: Secondary | ICD-10-CM | POA: Insufficient documentation

## 2021-01-16 DIAGNOSIS — I712 Thoracic aortic aneurysm, without rupture: Secondary | ICD-10-CM | POA: Insufficient documentation

## 2021-01-16 DIAGNOSIS — Z7901 Long term (current) use of anticoagulants: Secondary | ICD-10-CM | POA: Diagnosis not present

## 2021-01-16 DIAGNOSIS — I340 Nonrheumatic mitral (valve) insufficiency: Secondary | ICD-10-CM | POA: Diagnosis not present

## 2021-01-16 DIAGNOSIS — I480 Paroxysmal atrial fibrillation: Secondary | ICD-10-CM | POA: Diagnosis not present

## 2021-01-16 DIAGNOSIS — I081 Rheumatic disorders of both mitral and tricuspid valves: Secondary | ICD-10-CM | POA: Diagnosis not present

## 2021-01-16 HISTORY — PX: TEE WITHOUT CARDIOVERSION: SHX5443

## 2021-01-16 SURGERY — ECHOCARDIOGRAM, TRANSESOPHAGEAL
Anesthesia: Monitor Anesthesia Care

## 2021-01-16 MED ORDER — PROPOFOL 10 MG/ML IV BOLUS
INTRAVENOUS | Status: DC | PRN
  Administered 2021-01-16: 25 mg via INTRAVENOUS
  Administered 2021-01-16: 20 mg via INTRAVENOUS
  Administered 2021-01-16 (×2): 25 mg via INTRAVENOUS

## 2021-01-16 MED ORDER — LIDOCAINE 2% (20 MG/ML) 5 ML SYRINGE
INTRAMUSCULAR | Status: DC | PRN
  Administered 2021-01-16: 60 mg via INTRAVENOUS

## 2021-01-16 MED ORDER — PROPOFOL 500 MG/50ML IV EMUL
INTRAVENOUS | Status: DC | PRN
  Administered 2021-01-16: 75 ug/kg/min via INTRAVENOUS

## 2021-01-16 MED ORDER — SODIUM CHLORIDE 0.9 % IV SOLN: INTRAVENOUS | Status: DC | PRN

## 2021-01-16 NOTE — Interval H&P Note (Signed)
History and Physical Interval Note:  01/16/2021 9:31 AM  Johnny [name]  has presented today for surgery, with the diagnosis of MITRAL VALVE DISORDER.  The various methods of treatment have been discussed with the patient and family. After consideration of risks, benefits and other options for treatment, the patient has consented to  Procedure(s): TRANSESOPHAGEAL ECHOCARDIOGRAM (TEE) (N/A) as a surgical intervention.  The patient's history has been reviewed, patient examined, no change in status, stable for surgery.  I have reviewed the patient's chart and labs.  Questions were answered to the patient's satisfaction.     Olga Millers

## 2021-01-16 NOTE — Progress Notes (Signed)
  Echocardiogram 2D Echocardiogram has been performed.  Johnny [name] 01/16/2021, 10:32 AM

## 2021-01-16 NOTE — Anesthesia Preprocedure Evaluation (Signed)
Anesthesia Evaluation  Patient identified by MRN, date of birth, ID band Patient awake    Reviewed: Allergy & Precautions, NPO status , Patient's Chart, lab work & pertinent test results  Airway Mallampati: III  TM Distance: >3 FB Neck ROM: Full    Dental no notable dental hx.    Pulmonary neg pulmonary ROS,    Pulmonary exam normal breath sounds clear to auscultation       Cardiovascular Normal cardiovascular exam+ dysrhythmias Atrial Fibrillation + Valvular Problems/Murmurs MR  Rhythm:Regular Rate:Normal     Neuro/Psych Anxiety negative neurological ROS     GI/Hepatic negative GI ROS, Neg liver ROS,   Endo/Other  negative endocrine ROS  Renal/GU negative Renal ROS     Musculoskeletal negative musculoskeletal ROS (+)   Abdominal   Peds  Hematology negative hematology ROS (+)   Anesthesia Other Findings MITRAL VALVE DISORDER  Reproductive/Obstetrics                             Anesthesia Physical Anesthesia Plan  ASA: II  Anesthesia Plan: MAC   Post-op Pain Management:    Induction: Intravenous  PONV Risk Score and Plan: 1 and Propofol infusion and Treatment may vary due to age or medical condition  Airway Management Planned: Nasal Cannula  Additional Equipment:   Intra-op Plan:   Post-operative Plan:   Informed Consent: I have reviewed the patients History and Physical, chart, labs and discussed the procedure including the risks, benefits and alternatives for the proposed anesthesia with the patient or authorized representative who has indicated his/her understanding and acceptance.     Dental advisory given  Plan Discussed with: CRNA  Anesthesia Plan Comments:         Anesthesia Quick Evaluation

## 2021-01-16 NOTE — H&P (Signed)
Office Visit  12/12/2020 [facility] Dondra Prader, MD  Cardiology  Thoracic aortic aneurysm without rupture (HCC) +3 more  Dx  Follow-up. Atrial Fibrillation; Referred by Barbette Merino, NP  Reason for Visit    Additional Documentation  Vitals:  BP 106/70 (BP Location: Left Arm, Patient Position: Sitting, Cuff Size: Normal)  Pulse 90  Ht 6\' 2"  (1.88 m)  Wt 103.4 kg  BMI 29.27 kg/m  BSA 2.32 m    More Vitals  Flowsheets:  Anthropometrics,  NEWS,  MEWS Score    Encounter Info:  Billing Info,  History,  Allergies,  Detailed Report     Orthostatic Vitals Recorded in This Encounter   12/12/2020  1501     Patient Position: Sitting  BP Location: Left Arm  Cuff Size: Normal   All Notes   Progress Notes by 12/14/2020, MD at 12/12/2020 3:20 PM  Author: 12/14/2020, MD Author Type: Physician Filed: 12/12/2020 3:45 PM  Note Status: Signed Cosign: Cosign Not Required Encounter Date: 12/12/2020  Editor: 12/14/2020, MD (Physician)                    HPI: FU atrial fibrillation, thoracic aortic aneurysm and mitral regurgitation. He had a TEE-guided cardioversion on July 13, 2008. His TEE showed normal LV function. He had prolapse of the posterior mitral valve leaflet with moderate mitral regurgitation (2+). He also had a patent foramen ovale. Stress echocardiogram in January of 2011 was normal.Abdominal ultrasound January 2017 showed abdominal aortic aneurysm measuring 3.3 cm.MRA August 2019 showed 4.9 cm dilated aortic root and dilated aortic arch measuring 3.8 cm. Echocardiogram February 2022 showed normal LV function, moderate left atrial enlargement, flail posterior mitral valve leaflet with moderate mitral regurgitation.Carotid Dopplers February 2022 showed no significant stenosis. CTA February 2022 showed no pulmonary embolus, thoracic aortic aneurysm measuring 4.9 cm.  Recently discharged following admission for  syncope felt possibly vagal in etiology.  At that time also noted to be Covid positive.  Outpatient monitor recommended and also possible TEE to further evaluate mitral regurgitation.  Follow-up MRA March 2022 showed 4.7 to 4.8 cm thoracic aortic aneurysm at the sinuses of Valsalva.  Since he was last seen,he denies dyspnea, chest pain, palpitations, syncope or bleeding.        Current Outpatient Medications  Medication Sig Dispense Refill  . acetaminophen (TYLENOL) 325 MG tablet Take 2 tablets (650 mg total) by mouth every 6 (six) hours as needed for mild pain (or Fever >/= 101). 30 tablet 0  . ELIQUIS 5 MG TABS tablet Take 1 tablet by mouth twice daily 180 tablet 1  . metoprolol succinate (TOPROL-XL) 25 MG 24 hr tablet Take 1 tablet (25 mg total) by mouth daily. 90 tablet 3  . ondansetron (ZOFRAN) 4 MG tablet Take 1 tablet (4 mg total) by mouth every 6 (six) hours as needed for nausea. 10 tablet 0  . propranolol (INDERAL) 10 MG tablet Take 1 tablet (10 mg total) by mouth 4 (four) times daily as needed (atrial fib). 90 tablet 1  . VITAMIN D, CHOLECALCIFEROL, PO Take 1 capsule by mouth daily.    April 2022 zinc gluconate 50 MG tablet Take 50 mg by mouth daily.    Marland Kitchen diltiazem (CARDIZEM CD) 240 MG 24 hr capsule Take 1 capsule (240 mg total) by mouth daily. (Patient not taking: Reported on 12/12/2020) 90 capsule 3   No current facility-administered medications for this visit.  Past Medical History:  Diagnosis Date  . Atrial fibrillation (HCC)   . Chronic insomnia   . History of rheumatic fever    age 62  . Mitral regurgitation   . Patent foramen ovale   . Thoracic aortic aneurysm [facility])          Past Surgical History:  Procedure Laterality Date  . CARDIOVERSION      Social History        Socioeconomic History  . Marital status: Married    Spouse name: Not on file  . Number of children: Not on file  . Years of education: Not on file  . Highest education  level: Not on file  Occupational History  . Not on file  Tobacco Use  . Smoking status: Never Smoker  . Smokeless tobacco: Never Used  Substance and Sexual Activity  . Alcohol use: No  . Drug use: No  . Sexual activity: Not on file  Other Topics Concern  . Not on file  Social History Narrative   ** Merged History Encounter **       [employer]   Married   2 daughters ages 11, 10         Social Determinants of Health   Financial Resource Strain: Not on file  Food Insecurity: Not on file  Transportation Needs: Not on file  Physical Activity: Not on file  Stress: Not on file  Social Connections: Not on file  Intimate Partner Violence: Not on file         Family History  Problem Relation Age of Onset  . Pulmonary fibrosis Mother   . Diverticulosis Father     ROS: no fevers or chills, productive cough, hemoptysis, dysphasia, odynophagia, melena, hematochezia, dysuria, hematuria, rash, seizure activity, orthopnea, PND, pedal edema, claudication. Remaining systems are negative.  Physical Exam: Well-developed well-nourished in no acute distress.  Skin is warm and dry.  HEENT is normal.  Neck is supple.  Chest is clear to auscultation with normal expansion.  Cardiovascular exam is regular rate and rhythm.  2/6 systolic murmur apex. Abdominal exam nontender or distended. No masses palpated. Extremities show no edema. neuro grossly intact  ECG- personally reviewed  A/P  1 thoracic aortic aneurysm-we will plan follow-up MRA February 2023.    2 history of mitral valve prolapse with moderate mitral regurgitation-patient was felt to have significant mitral regurgitation on recent transthoracic echocardiogram associated with prolapse of posterior mitral valve leaflet.  We will arrange a transesophageal echocardiogram to further assess.  If severe may need to consider early repair.  3 abdominal aortic aneurysm-not noted on recent  MRA.  4 history of paroxysmal atrial fibrillation-patient now with CHADSvasc 1.  Continue Toprol and apixaban.  5 recent syncopal episode-no recurrences.  Occurred in the setting of Covid infection.  Possibly dehydration with vagal contribution.  He has had no recurrences.  We will consider monitoring the future if his symptoms worsen or recur.  Olga Millers, MD       For TEE, no changes. Olga Millers

## 2021-01-16 NOTE — Discharge Instructions (Signed)
Transesophageal Echocardiogram Transesophageal echocardiogram (TEE) is a test that uses sound waves to take pictures of your heart. TEE is done by passing a small probe attached to a flexible tube down the part of the body that moves food from your mouth to your stomach (esophagus). The pictures give clear images of your heart. This can help your doctor see if there are problems with your heart. Tell a doctor about:  Any allergies you have.  All medicines you are taking. This includes vitamins, herbs, eye drops, creams, and over-the-counter medicines.  Any problems you or family members have had with anesthetic medicines.  Any blood disorders you have.  Any surgeries you have had.  Any medical conditions you have.  Any swallowing problems.  Whether you have or have had a blockage in the part of the body that moves food from your mouth to your stomach.  Whether you are pregnant or may be pregnant. What are the risks? In general, this is a safe procedure. But, problems may occur, such as:  Damage to nearby structures or organs.  A tear in the part of the body that moves food from your mouth to your stomach.  Irregular heartbeat.  Hoarse voice or trouble swallowing.  Bleeding. What happens before the procedure? Medicines  Ask your doctor about changing or stopping: ? Your normal medicines. ? Vitamins, herbs, and supplements. ? Over-the-counter medicines.  Do not take aspirin or ibuprofen unless you are told to. General instructions  Follow instructions from your doctor about what you cannot eat or drink.  You will take out any dentures or dental retainers.  Plan to have a responsible adult take you home from the hospital or clinic.  Plan to have a responsible adult care for you for the time you are told after you leave the hospital or clinic. This is important. What happens during the procedure?  An IV will be put into one of your veins.  You may be given: ? A  sedative. This medicine helps you relax. ? A medicine to numb the back of your throat. This may be sprayed or gargled.  Your blood pressure, heart rate, and breathing will be watched.  You may be asked to lie on your left side.  A bite block will be placed in your mouth. This keeps you from biting the tube.  The tip of the probe will be placed into the back of your mouth.  You will be asked to swallow.  Your doctor will take pictures of your heart.  The probe and bite block will be taken out after the test is done. The procedure may vary among doctors and hospitals.   What can I expect after the procedure?  You will be monitored until you leave the hospital or clinic. This includes checking your blood pressure, heart rate, breathing rate, and blood oxygen level.  Your throat may feel sore and numb. This will get better over time. You will not be allowed to eat or drink until the numbness has gone away.  It is common to have a sore throat for a day or two.  It is up to you to get the results of your procedure. Ask how to get your results when they are ready. Follow these instructions at home:  If you were given a sedative during your procedure, do not drive or use machines until your doctor says that it is safe.  Return to your normal activities when your doctor says that it is safe.    Keep all follow-up visits. Summary  TEE is a test that uses sound waves to take pictures of your heart.  You will be given a medicine to help you relax.  Do not drive or use machines until your doctor says that it is safe. This information is not intended to replace advice given to you by your health care provider. Make sure you discuss any questions you have with your health care provider. Document Revised: 04/26/2020 Document Reviewed: 04/26/2020 Elsevier Patient Education  2021 Elsevier Inc.  

## 2021-01-16 NOTE — Transfer of Care (Signed)
Immediate Anesthesia Transfer of Care Note  Patient: Johnny [name]  Procedure(s) Performed: TRANSESOPHAGEAL ECHOCARDIOGRAM (TEE) (N/A )  Patient Location: Endoscopy Unit  Anesthesia Type:MAC  Level of Consciousness: drowsy  Airway & Oxygen Therapy: Patient Spontanous Breathing and Patient connected to nasal cannula oxygen  Post-op Assessment: Report given to RN and Post -op Vital signs reviewed and stable  Post vital signs: Reviewed and stable  Last Vitals:  Vitals Value Taken Time  BP 99/62 01/16/21 1007  Temp    Pulse 67 01/16/21 1009  Resp 16 01/16/21 1009  SpO2 93 % 01/16/21 1009  Vitals shown include unvalidated device data.  Last Pain:  Vitals:   01/16/21 0851  TempSrc: Temporal  PainSc: 0-No pain         Complications: No complications documented.

## 2021-01-16 NOTE — Anesthesia Postprocedure Evaluation (Signed)
Anesthesia Post Note  Patient: [name]  Procedure(s) Performed: TRANSESOPHAGEAL ECHOCARDIOGRAM (TEE) (N/A )     Patient location during evaluation: Endoscopy Anesthesia Type: MAC Level of consciousness: awake Pain management: pain level controlled Vital Signs Assessment: post-procedure vital signs reviewed and stable Respiratory status: spontaneous breathing, nonlabored ventilation, respiratory function stable and patient connected to nasal cannula oxygen Cardiovascular status: stable and blood pressure returned to baseline Postop Assessment: no apparent nausea or vomiting Anesthetic complications: no   No complications documented.  Last Vitals:  Vitals:   01/16/21 1016 01/16/21 1027  BP: 111/66 108/60  Pulse: 67 69  Resp: 15 (!) 23  Temp:    SpO2: 92% 90%    Last Pain:  Vitals:   01/16/21 1027  TempSrc:   PainSc: 0-No pain                 Carrye Goller P Jaselynn Tamas

## 2021-01-16 NOTE — Anesthesia Procedure Notes (Signed)
Procedure Name: MAC Date/Time: 01/16/2021 9:40 AM Performed by: Colin Benton, CRNA Pre-anesthesia Checklist: Patient identified, Emergency Drugs available, Suction available and Patient being monitored Patient Re-evaluated:Patient Re-evaluated prior to induction Oxygen Delivery Method: Nasal cannula Induction Type: IV induction Airway Equipment and Method: Bite block Placement Confirmation: positive ETCO2 Dental Injury: Teeth and Oropharynx as per pre-operative assessment

## 2021-01-16 NOTE — Procedures (Signed)
    Transesophageal Echocardiogram Note  DRAYSEN WEYGANDT [id number] 1956-03-09  Procedure: Transesophageal Echocardiogram Indications: Mitral regurgitation  Procedure Details Consent: Obtained Time Out: Verified patient identification, verified procedure, site/side was marked, verified correct patient position, special equipment/implants available, Radiology Safety Procedures followed,  medications/allergies/relevent history reviewed, required imaging and test results available.  Performed  Medications:  Pt sedated by anesthesia with diprovan 309 mg IV total.  Normal LV function; moderate LAE; no LAA thrombus; ASA; dilated aortic root (4.4 cm); flail P2 segment of posterior MV leaflet with severe, eccentric MR.   Complications: No apparent complications Patient did tolerate procedure well.  Olga Millers, MD

## 2021-01-17 ENCOUNTER — Encounter (HOSPITAL_COMMUNITY): Payer: Self-pay | Admitting: Cardiology

## 2021-01-18 ENCOUNTER — Ambulatory Visit (INDEPENDENT_AMBULATORY_CARE_PROVIDER_SITE_OTHER): Payer: Medicare Other | Admitting: Cardiology

## 2021-01-18 ENCOUNTER — Encounter: Payer: Self-pay | Admitting: Cardiology

## 2021-01-18 ENCOUNTER — Other Ambulatory Visit: Payer: Self-pay

## 2021-01-18 VITALS — BP 108/68 | HR 95 | Ht 74.0 in | Wt 231.8 lb

## 2021-01-18 DIAGNOSIS — I480 Paroxysmal atrial fibrillation: Secondary | ICD-10-CM

## 2021-01-18 DIAGNOSIS — I712 Thoracic aortic aneurysm, without rupture, unspecified: Secondary | ICD-10-CM

## 2021-01-18 DIAGNOSIS — I340 Nonrheumatic mitral (valve) insufficiency: Secondary | ICD-10-CM | POA: Diagnosis not present

## 2021-01-18 NOTE — Patient Instructions (Signed)
  Follow-Up: At [facility], you and your health needs are our priority.  As part of our continuing mission to provide you with exceptional heart care, we have created designated Provider Care Teams.  These Care Teams include your primary Cardiologist (physician) and Advanced Practice Providers (APPs -  Physician Assistants and Nurse Practitioners) who all work together to provide you with the care you need, when you need it.  We recommend signing up for the patient portal called "MyChart".  Sign up information is provided on this After Visit Summary.  MyChart is used to connect with patients for Virtual Visits (Telemedicine).  Patients are able to view lab/test results, encounter notes, upcoming appointments, etc.  Non-urgent messages can be sent to your provider as well.   To learn more about what you can do with MyChart, go to [email or web address].    Your next appointment:   3 month(s)  The format for your next appointment:   In Person  Provider:   Brian Crenshaw, MD    

## 2021-01-26 ENCOUNTER — Telehealth: Payer: Self-pay | Admitting: Medical

## 2021-01-26 NOTE — H&P (Signed)
Office Visit  01/18/2021 [facility]   [place], Madolyn Frieze, MD  Cardiology  Mild mitral insufficiency +2 more  Dx  Follow-up. Atrial Fibrillation ; Referred by Barbette Merino, NP  Reason for Visit    Additional Documentation  Vitals:  BP 108/68  Pulse 95  Ht 6\' 2"  (1.88 m)  Wt 231 lb 12.8 oz (105.1 kg)  SpO2 94%  BMI 29.76 kg/m  BSA 2.34 m    More Vitals  Flowsheets:  Anthropometrics,  NEWS,  MEWS Score    Encounter Info:  Billing Info,  History,  Allergies,  Detailed Report     All Notes   Progress Notes by , MD at 01/18/2021 9:00 AM  Author: 03/20/2021, MD Author Type: Physician Filed: 01/18/2021 9:27 AM  Note Status: Addendum Cosign: Cosign Not Required Encounter Date: 01/18/2021  Editor: 03/20/2021, MD (Physician)      Prior Versions: 1. Lewayne Bunting, MD (Physician) at 01/18/2021 9:24 AM - Addendum   2. 03/20/2021, MD (Physician) at 01/18/2021 9:19 AM - Signed       HPI: FU atrial fibrillation, thoracic aortic aneurysm and mitral regurgitation. He had a TEE-guided cardioversion on July 13, 2008. His TEE showed normal LV function. He had prolapse of the posterior mitral valve leaflet with moderate mitral regurgitation (2+). He also had a patent foramen ovale. Abdominal ultrasound January 2017 showed abdominal aortic aneurysm measuring 3.3 cm.MRA August 2019 showed 4.9 cm dilated aortic root and dilated aortic arch measuring 3.8 cm. Echocardiogram February 2022 showed normal LV function, moderate left atrial enlargement, flail posterior mitral valve leaflet with moderate mitral regurgitation.Carotid Dopplers February 2022 showed no significant stenosis. CTA February 2022 showed no pulmonary embolus, thoracic aortic aneurysm measuring 4.9 cm. Recently discharged following admission for syncope felt possibly vagal in etiology. At that time also noted to be Covid positive. Follow-up MRA March 2022 showed 4.7  to 4.8 cm thoracic aortic aneurysm at the sinuses of Valsalva; however no abdominal aortic aneurysm noted. Transesophageal echocardiogram May 2022 showed normal LV function, mildly dilated aortic root at 44 mm, flail P2 segment of posterior mitral valve leaflet with severe eccentric mitral regurgitation, moderate left atrial enlargement. Since he was last seen,patient denies dyspnea, chest pain, palpitations or syncope.        Current Outpatient Medications  Medication Sig Dispense Refill  . acetaminophen (TYLENOL) 325 MG tablet Take 2 tablets (650 mg total) by mouth every 6 (six) hours as needed for mild pain (or Fever >/= 101). 30 tablet 0  . Cholecalciferol (VITAMIN D3) 250 MCG (10000 UT) capsule Take 10,000 Units by mouth in the morning.    June 2022 ELIQUIS 5 MG TABS tablet Take 1 tablet by mouth twice daily (Patient taking differently: Take 5 mg by mouth in the morning.) 180 tablet 1  . Hydroxocobalamin Acetate 1000 MCG/ML SOLN Inject 1,000 mcg into the muscle every other day.    Marland Kitchen MAGNESIUM PO Take 1 tablet by mouth daily.    . metoprolol succinate (TOPROL-XL) 25 MG 24 hr tablet Take 1 tablet (25 mg total) by mouth daily. (Patient taking differently: Take 25 mg by mouth in the morning.) 90 tablet 3  . Multiple Vitamin (MULTIVITAMIN WITH MINERALS) TABS tablet Take 1 tablet by mouth in the morning.    . propranolol (INDERAL) 10 MG tablet Take 1 tablet (10 mg total) by mouth 4 (four) times daily as needed (atrial fib). 90 tablet 1  . Zinc  50 MG TABS Take 50 mg by mouth daily.     No current facility-administered medications for this visit.         Past Medical History:  Diagnosis Date  . Atrial fibrillation (HCC)   . Chronic insomnia   . History of rheumatic fever    age 65  . Mitral regurgitation   . Patent foramen ovale   . Thoracic aortic aneurysm [facility])          Past Surgical History:  Procedure Laterality Date  . CARDIOVERSION    . TEE WITHOUT  CARDIOVERSION N/A 01/16/2021   Procedure: TRANSESOPHAGEAL ECHOCARDIOGRAM (TEE);  Surgeon: Lewayne Bunting, MD;  Location: [facility] ENDOSCOPY;  Service: Cardiovascular;  Laterality: N/A;    Social History        Socioeconomic History  . Marital status: Married    Spouse name: Not on file  . Number of children: Not on file  . Years of education: Not on file  . Highest education level: Not on file  Occupational History  . Not on file  Tobacco Use  . Smoking status: Never Smoker  . Smokeless tobacco: Never Used  Substance and Sexual Activity  . Alcohol use: No  . Drug use: No  . Sexual activity: Not on file  Other Topics Concern  . Not on file  Social History Narrative   ** Merged History Encounter **       [employer]   Married   2 daughters ages 94, 47         Social Determinants of Health   Financial Resource Strain: Not on file  Food Insecurity: Not on file  Transportation Needs: Not on file  Physical Activity: Not on file  Stress: Not on file  Social Connections: Not on file  Intimate Partner Violence: Not on file         Family History  Problem Relation Age of Onset  . Pulmonary fibrosis Mother   . Diverticulosis Father     ROS: no fevers or chills, productive cough, hemoptysis, dysphasia, odynophagia, melena, hematochezia, dysuria, hematuria, rash, seizure activity, orthopnea, PND, pedal edema, claudication. Remaining systems are negative.  Physical Exam: Well-developed well-nourished in no acute distress.  Skin is warm and dry.  HEENT is normal.  Neck is supple.  Chest is clear to auscultation with normal expansion.  Cardiovascular exam is regular rate and rhythm.  2/6 systolic murmur apex. Abdominal exam nontender or distended. No masses palpated. Extremities show no edema. neuro grossly intact   A/P  1 mitral valve prolapse/mitral regurgitation-transesophageal echocardiogram revealed flail P2 segment of  posterior mitral valve leaflet with probable ruptured cord.  Mitral regurgitation difficult to quantitate due to eccentric nature of the jet but appears to be severe.  Patient also with moderate left atrial enlargement and history of atrial fibrillation.  I have discussed/recommended mitral valve repair/maze procedure with him today. He would like to discuss this with Dr. Cornelius Moras including timing of surgery (Dr Orvan July pending departure) and risk/benefits.  We can arrange right and left cardiac catheterization once surgery is scheduled.  2 thoracic aortic aneurysm-patient will need follow-up MRA February 2023.  Not clear to me that he will require aortic root replacement at time of surgery as this dilatation has been stable since 2014.  Will review with Dr. Cornelius Moras.  3 paroxysmal atrial fibrillation-continue Toprol (patient remains in sinus rhythm).  Continue apixaban.  4 previous syncopal episode-no recurrences and occurred in the setting of COVID infection.  This is felt likely secondary to dehydration with possible vagal component as well.  Consider monitor in the future if he has recurrences.  Olga Millers, MD        Pt was scheduled for TEE; performed 5/2 Olga Millers

## 2021-01-26 NOTE — H&P (View-Only) (Signed)
Office Visit  01/18/2021 [facility]   [place], Madolyn Frieze, MD  Cardiology  Mild mitral insufficiency +2 more  Dx  Follow-up. Atrial Fibrillation ; Referred by Barbette Merino, NP  Reason for Visit    Additional Documentation  Vitals:  BP 108/68  Pulse 95  Ht 6\' 2"  (1.88 m)  Wt 231 lb 12.8 oz (105.1 kg)  SpO2 94%  BMI 29.76 kg/m  BSA 2.34 m    More Vitals  Flowsheets:  Anthropometrics,  NEWS,  MEWS Score    Encounter Info:  Billing Info,  History,  Allergies,  Detailed Report     All Notes   Progress Notes by , MD at 01/18/2021 9:00 AM  Author: 03/20/2021, MD Author Type: Physician Filed: 01/18/2021 9:27 AM  Note Status: Addendum Cosign: Cosign Not Required Encounter Date: 01/18/2021  Editor: 03/20/2021, MD (Physician)      Prior Versions: 1. Lewayne Bunting, MD (Physician) at 01/18/2021 9:24 AM - Addendum   2. 03/20/2021, MD (Physician) at 01/18/2021 9:19 AM - Signed       HPI: FU atrial fibrillation, thoracic aortic aneurysm and mitral regurgitation. He had a TEE-guided cardioversion on July 13, 2008. His TEE showed normal LV function. He had prolapse of the posterior mitral valve leaflet with moderate mitral regurgitation (2+). He also had a patent foramen ovale. Abdominal ultrasound January 2017 showed abdominal aortic aneurysm measuring 3.3 cm.MRA August 2019 showed 4.9 cm dilated aortic root and dilated aortic arch measuring 3.8 cm. Echocardiogram February 2022 showed normal LV function, moderate left atrial enlargement, flail posterior mitral valve leaflet with moderate mitral regurgitation.Carotid Dopplers February 2022 showed no significant stenosis. CTA February 2022 showed no pulmonary embolus, thoracic aortic aneurysm measuring 4.9 cm. Recently discharged following admission for syncope felt possibly vagal in etiology. At that time also noted to be Covid positive. Follow-up MRA March 2022 showed 4.7  to 4.8 cm thoracic aortic aneurysm at the sinuses of Valsalva; however no abdominal aortic aneurysm noted. Transesophageal echocardiogram May 2022 showed normal LV function, mildly dilated aortic root at 44 mm, flail P2 segment of posterior mitral valve leaflet with severe eccentric mitral regurgitation, moderate left atrial enlargement. Since he was last seen,patient denies dyspnea, chest pain, palpitations or syncope.        Current Outpatient Medications  Medication Sig Dispense Refill  . acetaminophen (TYLENOL) 325 MG tablet Take 2 tablets (650 mg total) by mouth every 6 (six) hours as needed for mild pain (or Fever >/= 101). 30 tablet 0  . Cholecalciferol (VITAMIN D3) 250 MCG (10000 UT) capsule Take 10,000 Units by mouth in the morning.    June 2022 ELIQUIS 5 MG TABS tablet Take 1 tablet by mouth twice daily (Patient taking differently: Take 5 mg by mouth in the morning.) 180 tablet 1  . Hydroxocobalamin Acetate 1000 MCG/ML SOLN Inject 1,000 mcg into the muscle every other day.    Marland Kitchen MAGNESIUM PO Take 1 tablet by mouth daily.    . metoprolol succinate (TOPROL-XL) 25 MG 24 hr tablet Take 1 tablet (25 mg total) by mouth daily. (Patient taking differently: Take 25 mg by mouth in the morning.) 90 tablet 3  . Multiple Vitamin (MULTIVITAMIN WITH MINERALS) TABS tablet Take 1 tablet by mouth in the morning.    . propranolol (INDERAL) 10 MG tablet Take 1 tablet (10 mg total) by mouth 4 (four) times daily as needed (atrial fib). 90 tablet 1  . Zinc  50 MG TABS Take 50 mg by mouth daily.     No current facility-administered medications for this visit.         Past Medical History:  Diagnosis Date  . Atrial fibrillation (HCC)   . Chronic insomnia   . History of rheumatic fever    age 65  . Mitral regurgitation   . Patent foramen ovale   . Thoracic aortic aneurysm [facility])          Past Surgical History:  Procedure Laterality Date  . CARDIOVERSION    . TEE WITHOUT  CARDIOVERSION N/A 01/16/2021   Procedure: TRANSESOPHAGEAL ECHOCARDIOGRAM (TEE);  Surgeon: Lewayne Bunting, MD;  Location: [facility] ENDOSCOPY;  Service: Cardiovascular;  Laterality: N/A;    Social History        Socioeconomic History  . Marital status: Married    Spouse name: Not on file  . Number of children: Not on file  . Years of education: Not on file  . Highest education level: Not on file  Occupational History  . Not on file  Tobacco Use  . Smoking status: Never Smoker  . Smokeless tobacco: Never Used  Substance and Sexual Activity  . Alcohol use: No  . Drug use: No  . Sexual activity: Not on file  Other Topics Concern  . Not on file  Social History Narrative   ** Merged History Encounter **       [employer]   Married   2 daughters ages 94, 47         Social Determinants of Health   Financial Resource Strain: Not on file  Food Insecurity: Not on file  Transportation Needs: Not on file  Physical Activity: Not on file  Stress: Not on file  Social Connections: Not on file  Intimate Partner Violence: Not on file         Family History  Problem Relation Age of Onset  . Pulmonary fibrosis Mother   . Diverticulosis Father     ROS: no fevers or chills, productive cough, hemoptysis, dysphasia, odynophagia, melena, hematochezia, dysuria, hematuria, rash, seizure activity, orthopnea, PND, pedal edema, claudication. Remaining systems are negative.  Physical Exam: Well-developed well-nourished in no acute distress.  Skin is warm and dry.  HEENT is normal.  Neck is supple.  Chest is clear to auscultation with normal expansion.  Cardiovascular exam is regular rate and rhythm.  2/6 systolic murmur apex. Abdominal exam nontender or distended. No masses palpated. Extremities show no edema. neuro grossly intact   A/P  1 mitral valve prolapse/mitral regurgitation-transesophageal echocardiogram revealed flail P2 segment of  posterior mitral valve leaflet with probable ruptured cord.  Mitral regurgitation difficult to quantitate due to eccentric nature of the jet but appears to be severe.  Patient also with moderate left atrial enlargement and history of atrial fibrillation.  I have discussed/recommended mitral valve repair/maze procedure with him today. He would like to discuss this with Dr. Cornelius Moras including timing of surgery (Dr Orvan July pending departure) and risk/benefits.  We can arrange right and left cardiac catheterization once surgery is scheduled.  2 thoracic aortic aneurysm-patient will need follow-up MRA February 2023.  Not clear to me that he will require aortic root replacement at time of surgery as this dilatation has been stable since 2014.  Will review with Dr. Cornelius Moras.  3 paroxysmal atrial fibrillation-continue Toprol (patient remains in sinus rhythm).  Continue apixaban.  4 previous syncopal episode-no recurrences and occurred in the setting of COVID infection.  This is felt likely secondary to dehydration with possible vagal component as well.  Consider monitor in the future if he has recurrences.  Olga Millers, MD        Pt was scheduled for TEE; performed 5/2 Olga Millers

## 2021-01-26 NOTE — Telephone Encounter (Signed)
Cardiologist sent me a note on this patient.  I am not his PCP per chart review.  Took myself off as his PCP.  If someone could call patient and inquire who his PCP is and if he is trying to transfer to me?   Will wait until the day he sees me before being designated PCP.

## 2021-01-27 NOTE — Telephone Encounter (Signed)
Patient has a new patient appt with you scheduled for 02/17/2021.  Here, in our office, we never update PCP until new patient has presented at check in for the new patient appt.

## 2021-01-27 NOTE — Telephone Encounter (Signed)
Cardiologist office or someone else put me as pcp. I was not aware he was scheduled with me.   Thanks for your help.

## 2021-02-06 ENCOUNTER — Encounter: Payer: Self-pay | Admitting: Thoracic Surgery (Cardiothoracic Vascular Surgery)

## 2021-02-06 ENCOUNTER — Encounter: Payer: Medicare Other | Admitting: Thoracic Surgery (Cardiothoracic Vascular Surgery)

## 2021-02-07 ENCOUNTER — Other Ambulatory Visit: Payer: Self-pay | Admitting: *Deleted

## 2021-02-07 ENCOUNTER — Other Ambulatory Visit: Payer: Self-pay | Admitting: Thoracic Surgery (Cardiothoracic Vascular Surgery)

## 2021-02-07 ENCOUNTER — Encounter: Payer: Self-pay | Admitting: Thoracic Surgery (Cardiothoracic Vascular Surgery)

## 2021-02-07 ENCOUNTER — Encounter: Payer: Self-pay | Admitting: *Deleted

## 2021-02-07 ENCOUNTER — Other Ambulatory Visit: Payer: Self-pay

## 2021-02-07 ENCOUNTER — Institutional Professional Consult (permissible substitution): Payer: Medicare Other | Admitting: Thoracic Surgery (Cardiothoracic Vascular Surgery)

## 2021-02-07 ENCOUNTER — Telehealth: Payer: Self-pay | Admitting: *Deleted

## 2021-02-07 VITALS — BP 118/71 | HR 77 | Resp 20 | Ht 74.0 in | Wt 232.0 lb

## 2021-02-07 DIAGNOSIS — I712 Thoracic aortic aneurysm, without rupture, unspecified: Secondary | ICD-10-CM

## 2021-02-07 DIAGNOSIS — I340 Nonrheumatic mitral (valve) insufficiency: Secondary | ICD-10-CM

## 2021-02-07 DIAGNOSIS — I480 Paroxysmal atrial fibrillation: Secondary | ICD-10-CM

## 2021-02-07 MED ORDER — SODIUM CHLORIDE 0.9% FLUSH: 3.0000 mL | Freq: Two times a day (BID) | INTRAVENOUS | Status: DC

## 2021-02-07 NOTE — Telephone Encounter (Signed)
Spoke with pt, he is being scheduled for MV repair and will need to have a R&L cath prior to that procedure.     [facility] [address] [place] [address] Dept: [phone] Loc: [phone]  ELIJAN GOOGE  02/07/2021  You are scheduled for a Cardiac Catheterization on Friday, May 27 with Dr. Tonny Bollman.  1. Please arrive at the [facility] (Main Entrance A) at [facility]: [address] [place], [place] [address] at 10:00 AM (This time is two hours before your procedure to ensure your preparation). Free valet parking service is available.   Special note: Every effort is made to have your procedure done on time. Please understand that emergencies sometimes delay scheduled procedures.  2. Diet: Do not eat solid foods after midnight.  The patient may have clear liquids until 5am upon the day of the procedure.  3. Labs: You will need to have blood drawn BEFORE 02/09/21. You do not need to be fasting.  4. Medication instructions in preparation for your procedure:  DO NOT TAKE ELIQUIS 02/08/21, 02/09/21 OR 02/10/21 RESTART ON 02/11/21  On the morning of your procedure, take your any morning medicines NOT listed above.  You may use sips of water.  5. Plan for one night stay--bring personal belongings. 6. Bring a current list of your medications and current insurance cards. 7. You MUST have a responsible person to drive you home. 8. Someone MUST be with you the first 24 hours after you arrive home or your discharge will be delayed. 9. Please wear clothes that are easy to get on and off and wear slip-on shoes.  Thank you for allowing [place] to care for you!   -- [facility]

## 2021-02-07 NOTE — Progress Notes (Signed)
[address]       [place] [address]             [phone]     CARDIOTHORACIC SURGERY CONSULTATION REPORT  Referring Provider is Jens Som, Madolyn Frieze, MD PCP is Heilingoetter, Johnette Abraham, PA-C  Chief Complaint  Patient presents with  . Mitral Regurgitation    Surgical consult, TEE 01/16/21,     HPI:  Patient is 65 year old male with history of mitral valve prolapse and mitral regurgitation, recurrent paroxysmal atrial fibrillation, and aneurysmal dilatation of the aortic root who has been referred for surgical consultation.  Patient states he has known of presence of a heart murmur for many years.  He states that he was told that he had rheumatic fever at age 24.  He has been followed for more than 10 years by Dr. Jens Som having originally presented with an episode of paroxysmal atrial fibrillation in the remote past.  He has been on long-term anticoagulation using Eliquis.  He has remained in sinus rhythm until last summer when he had another episode of paroxysmal atrial fibrillation that did not require cardioversion.  Transthoracic echocardiogram performed at that time revealed normal left ventricular systolic function with mitral valve prolapse and what was felt to be mild mitral regurgitation.  In early February of this year the patient developed COVID-19 pneumonia.  More than a week into his illness he suffered a syncopal episode at home for which she was hospitalized.  Work-up in the hospital included EKG, CTA of the chest, CT of the head and cervical spine, chest x-ray, orthostatic vital signs, carotid ultrasound, and blood work.  All of this turned out to be negative.   Transthoracic echocardiogram performed at that time revealed normal left ventricular systolic function with an obvious flail segment of the posterior leaflet of the mitral valve with at least moderate mitral regurgitation.  The patient was ultimately discharged home with his syncopal episode  attributed to possible vasovagal event.  Holter monitor was planned but apparently never completed.  The patient was seen in follow-up by Dr. Jens Som and transesophageal echocardiogram performed Jan 16, 2021 to further evaluate the severity of mitral regurgitation.  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  Left ventricular size and systolic function appeared normal.  There was moderate left atrial enlargement.  The patient was subsequently referred for elective surgical consultation.  Patient is married and lives locally in [place] with his wife who is a former patient of mine.  Patient continues to work but he does not exercise at all on a regular basis.  However, he reports no significant physical limitations and he still tends to vigorous chores around the house.Marland Kitchen  He specifically denies any symptoms of exertional shortness of breath.  He states that he does seem to fatigue more easily than he used to with strenuous activity.  He has not had any palpitations nor other symptoms to suggest a recurrence of atrial fibrillation.  Since hospital discharge last February he has not had any further symptoms of dizziness or syncope.  He specifically denies any history of resting shortness of breath, PND, orthopnea, or lower extremity edema.  He has never had any chest pain or chest tightness either with activity or at rest.  Past Medical History:  Diagnosis Date  . Atrial fibrillation (HCC)   . Chronic insomnia   . History  of rheumatic fever    age 65  . Mitral regurgitation   . Patent foramen ovale   . Thoracic aortic aneurysm [facility])     Past Surgical History:  Procedure Laterality Date  . CARDIOVERSION    . TEE WITHOUT CARDIOVERSION N/A 01/16/2021   Procedure: TRANSESOPHAGEAL ECHOCARDIOGRAM (TEE);  Surgeon: Lewayne Bunting, MD;  Location: [facility] ENDOSCOPY;  Service:  Cardiovascular;  Laterality: N/A;    Family History  Problem Relation Age of Onset  . Pulmonary fibrosis Mother   . Diverticulosis Father     Social History   Socioeconomic History  . Marital status: Married    Spouse name: Not on file  . Number of children: Not on file  . Years of education: Not on file  . Highest education level: Not on file  Occupational History  . Not on file  Tobacco Use  . Smoking status: Never Smoker  . Smokeless tobacco: Never Used  Substance and Sexual Activity  . Alcohol use: No  . Drug use: No  . Sexual activity: Not on file  Other Topics Concern  . Not on file  Social History Narrative   ** Merged History Encounter **       [employer]   Married   2 daughters ages 64, 6         Social Determinants of Health   Financial Resource Strain: Not on file  Food Insecurity: Not on file  Transportation Needs: Not on file  Physical Activity: Not on file  Stress: Not on file  Social Connections: Not on file  Intimate Partner Violence: Not on file    Current Outpatient Medications  Medication Sig Dispense Refill  . acetaminophen (TYLENOL) 325 MG tablet Take 2 tablets (650 mg total) by mouth every 6 (six) hours as needed for mild pain (or Fever >/= 101). 30 tablet 0  . Cholecalciferol (VITAMIN D3) 250 MCG (10000 UT) capsule Take 10,000 Units by mouth in the morning.    Marland Kitchen ELIQUIS 5 MG TABS tablet Take 1 tablet by mouth twice daily (Patient taking differently: Take 5 mg by mouth in the morning.) 180 tablet 1  . Hydroxocobalamin Acetate 1000 MCG/ML SOLN Inject 1,000 mcg into the muscle every other day.    Marland Kitchen MAGNESIUM PO Take 1 tablet by mouth daily.    . metoprolol succinate (TOPROL-XL) 25 MG 24 hr tablet Take 1 tablet (25 mg total) by mouth daily. (Patient taking differently: Take 25 mg by mouth in the morning.) 90 tablet 3  . Multiple Vitamin (MULTIVITAMIN WITH MINERALS) TABS tablet Take 1 tablet by mouth in the morning.    .  propranolol (INDERAL) 10 MG tablet Take 1 tablet (10 mg total) by mouth 4 (four) times daily as needed (atrial fib). 90 tablet 1  . Zinc 50 MG TABS Take 50 mg by mouth daily.     No current facility-administered medications for this visit.    Allergies  Allergen Reactions  . Bee Venom Anaphylaxis  . Antihistamines, Chlorpheniramine-Type Other (See Comments)    Acts as a depressant with pt.      Review of Systems:   General:  normal appetite, decreased energy, no weight gain, no weight loss, no fever  Cardiac:  no chest pain with exertion, no chest pain at rest, no SOB with exertion, no resting SOB, no PND, no orthopnea, no palpitations, no arrhythmia, no atrial fibrillation, no LE edema, no dizzy spells, 1 episode syncope  Respiratory:  no shortness of  breath, no home oxygen, no productive cough, no dry cough, no bronchitis, no wheezing, no hemoptysis, no asthma, no pain with inspiration or cough, no sleep apnea, no CPAP at night  GI:   no difficulty swallowing, no reflux, no frequent heartburn, no hiatal hernia, no abdominal pain, no constipation, no diarrhea, no hematochezia, no hematemesis, no melena  GU:   no dysuria,  no frequency, no urinary tract infection, no hematuria, no enlarged prostate, no kidney stones, no kidney disease  Vascular:  no pain suggestive of claudication, no pain in feet, no leg cramps, no varicose veins, no DVT, no non-healing foot ulcer  Neuro:   no stroke, no TIA's, no seizures, no headaches, no temporary blindness one eye,  no slurred speech, no peripheral neuropathy, no chronic pain, no instability of gait, no memory/cognitive dysfunction  Musculoskeletal: no arthritis, no joint swelling, no myalgias, no difficulty walking, normal mobility   Skin:   no rash, no itching, no skin infections, no pressure sores or ulcerations  Psych:   no anxiety, no depression, no nervousness, no unusual recent stress  Eyes:   no blurry vision, no floaters, no recent vision  changes, + wears glasses or contacts  ENT:   no hearing loss, no loose or painful teeth, no dentures, last saw dentist within the past year  Hematologic:  no easy bruising, no abnormal bleeding, no clotting disorder, no frequent epistaxis  Endocrine:  no diabetes, does not check CBG's at home     Physical Exam:   BP 118/71 (BP Location: Left Arm, Patient Position: Sitting)   Pulse 77   Resp 20   Ht 6\' 2"  (1.88 m)   Wt 232 lb (105.2 kg)   SpO2 97% Comment: RA  BMI 29.79 kg/m   General:  Mildly obese,  well-appearing  HEENT:  Unremarkable   Neck:   no JVD, no bruits, no adenopathy   Chest:   clear to auscultation, symmetrical breath sounds, no wheezes, no rhonchi   CV:   RRR, grade III/VI holosystolic murmur   Abdomen:  soft, non-tender, no masses   Extremities:  warm, well-perfused, pulses palpable, no LE edema  Rectal/GU  Deferred  Neuro:   Grossly non-focal and symmetrical throughout  Skin:   Clean and dry, no rashes, no breakdown   Diagnostic Tests:   ECHOCARDIOGRAM REPORT       Patient Name:  WALDO DAMIAN Date of Exam: 10/27/2020  Medical Rec #: [id number]   Height:    74.0 in  Accession #:  [id number]  Weight:    230.0 lb  Date of Birth: May 22, 1956   BSA:     2.306 m  Patient Age:  59 years   BP:      127/75 mmHg  Patient Gender: M       HR:      79 bpm.  Exam Location: Inpatient   Procedure: 2D Echo, Cardiac Doppler and Color Doppler             REPORT CONTAINS CRITICAL RESULT    Findings communicated to inpatient attending physician Dr. 71 at 4:02  PM.   Indications:  Syncope 780.2 / R55    History:    Patient has no prior history of Echocardiogram  examinations.         Arrythmias:Atrial Fibrillation; Signs/Symptoms:Syncope.    Sonographer:  10-26-1988 RDCS  Referring Phys: [id number] SARA L NEAL   IMPRESSIONS    1. Left ventricular ejection fraction, by estimation, is 60  to 65%. The  left ventricle has normal function. The left ventricle has no regional  wall motion abnormalities. Left ventricular diastolic parameters are  indeterminate.  2. Right ventricular systolic function is normal. The right ventricular  size is normal. There is normal pulmonary artery systolic pressure.  3. Left atrial size was moderately dilated.  4. Flail posterior leaflet of mitral valve, see images 57, 81. The mitral  valve is myxomatous. Moderate mitral valve regurgitation. No evidence of  mitral stenosis. There is severe holosystolic prolapse of posterior  leaflet of the mitral valve.  5. The aortic valve is tricuspid. Aortic valve regurgitation is not  visualized. No aortic stenosis is present.  6. Aortic root/ascending aorta has been repaired/replaced. There is mild  dilatation of the ascending aorta, measuring 38 mm.   Comparison(s): No prior Echocardiogram.   Conclusion(s)/Recommendation(s): Eccentric MR with flail posterior leaflet  of mitral valve. Given angles cannot fully exclude severe MR. Recommend  TEE for further evaluation. As patient is currently Covid positive, would  consider TEE once recovered from  Covid.   FINDINGS  Left Ventricle: Left ventricular ejection fraction, by estimation, is 60  to 65%. The left ventricle has normal function. The left ventricle has no  regional wall motion abnormalities. The left ventricular internal cavity  size was normal in size. There is  no left ventricular hypertrophy. Left ventricular diastolic parameters  are indeterminate.   Right Ventricle: The right ventricular size is normal. No increase in  right ventricular wall thickness. Right ventricular systolic function is  normal. There is normal pulmonary artery systolic pressure. The tricuspid  regurgitant velocity is 2.15 m/s, and  with an assumed right atrial pressure of 8 mmHg, the estimated right  ventricular systolic pressure is 26.5 mmHg.   Left  Atrium: Left atrial size was moderately dilated.   Right Atrium: Right atrial size was normal in size.   Pericardium: There is no evidence of pericardial effusion. Presence of  pericardial fat pad.   Mitral Valve: Flail posterior leaflet of mitral valve, see images 57, 81.  The mitral valve is myxomatous. There is severe holosystolic prolapse of  posterior leaflet of the mitral valve. Moderate mitral valve  regurgitation, with anteriorly-directed jet.  No evidence of mitral valve stenosis.   Tricuspid Valve: The tricuspid valve is normal in structure. Tricuspid  valve regurgitation is trivial. No evidence of tricuspid stenosis.   Aortic Valve: The aortic valve is tricuspid. Aortic valve regurgitation is  not visualized. No aortic stenosis is present.   Pulmonic Valve: The pulmonic valve was grossly normal. Pulmonic valve  regurgitation is trivial. No evidence of pulmonic stenosis.   Aorta: The aortic root/ascending aorta has been repaired/replaced. There  is mild dilatation of the ascending aorta, measuring 38 mm.   Venous: The inferior vena cava was not well visualized.   IAS/Shunts: The atrial septum is grossly normal.     LEFT VENTRICLE  PLAX 2D  LVIDd:     5.30 cm Diastology  LVIDs:     3.30 cm LV e' medial:  6.96 cm/s  LV PW:     0.80 cm LV E/e' medial: 15.8  LV IVS:    0.80 cm LV e' lateral:  6.31 cm/s  LVOT diam:   2.40 cm LV E/e' lateral: 17.4  LV SV:     105  LV SV Index:  46  LVOT Area:   4.52 cm     RIGHT VENTRICLE  RV S prime:   15.30 cm/s  TAPSE (M-mode): 2.7 cm   LEFT ATRIUM       Index    RIGHT ATRIUM      Index  LA diam:    3.70 cm 1.60 cm/m RA Area:   15.30 cm  LA Vol (A2C):  74.1 ml 32.13 ml/m RA Volume:  35.10 ml 15.22 ml/m  LA Vol (A4C):  71.3 ml 30.91 ml/m  LA Biplane Vol: 74.6 ml 32.34 ml/m  AORTIC VALVE  LVOT Vmax:  114.00 cm/s  LVOT Vmean: 75.600 cm/s  LVOT VTI:   0.233 m    AORTA  Ao Root diam: 4.30 cm  Ao Asc diam: 3.80 cm   MITRAL VALVE        TRICUSPID VALVE  MV Area (PHT): 3.85 cm   TR Peak grad:  18.5 mmHg  MV Decel Time: 197 msec   TR Vmax:    215.00 cm/s  MV E velocity: 110.00 cm/s  MV A velocity: 96.30 cm/s  SHUNTS  MV E/A ratio: 1.14     Systemic VTI: 0.23 m               Systemic Diam: 2.40 cm   Jodelle Red MD  Electronically signed by Jodelle Red MD  Signature Date/Time: 10/27/2020/4:02:36 PM     TRANSESOPHOGEAL ECHO REPORT       Patient Name:  Lorenda Peck Date of Exam: 01/16/2021  Medical Rec #: [id number]   Height:    74.0 in  Accession #:  [id number]  Weight:    230.0 lb  Date of Birth: [birth year]   BSA:     2.306 m  Patient Age:  64 years   BP:      111/66 mmHg  Patient Gender: M       HR:      85 bpm.  Exam Location: Inpatient   Procedure: Transesophageal Echo, Color Doppler, Cardiac Doppler and 3D  Echo   Indications:   Mitral Valve Disorder    History:     Patient has prior history of Echocardiogram examinations,  most          recent 10/27/2020. Arrythmias:Atrial Fibrillation.    Sonographer:   Thurman Coyer RDCS (AE)  Referring Phys: [id number] BRIAN S CRENSHAW  Diagnosing Phys: Olga Millers MD   PROCEDURE: After discussion of the risks and benefits of a TEE, an  informed consent was obtained from the patient. The transesophogeal probe  was passed without difficulty through the esophogus of the patient.  Sedation performed by different physician.  The patient was monitored while under deep sedation. Anesthestetic  sedation was provided intravenously by Anesthesiology: 308.82mg  of  Propofol, 60mg  of Lidocaine. The patient developed no complications during  the procedure.   IMPRESSIONS    1. Flail P2 segement of posterior MV leaflet with severe eccentric MR.  2. Left  ventricular ejection fraction, by estimation, is 60 to 65%. The  left ventricle has normal function.  3. Right ventricular systolic function is normal. The right ventricular  size is normal.  4. Left atrial size was moderately dilated. No left atrial/left atrial  appendage thrombus was detected.  5. The mitral valve is abnormal. Severe mitral valve regurgitation. There  is severe holosystolic prolapse of the middle scallop of the posterior  leaflet of the mitral valve.  6. The aortic valve is tricuspid. Aortic valve regurgitation is not  visualized.  7. Aortic dilatation noted. There is mild dilatation of the aortic root,  measuring 44 mm. There is mild dilatation  of the ascending aorta,  measuring 42 mm.   FINDINGS  Left Ventricle: Left ventricular ejection fraction, by estimation, is 60  to 65%. The left ventricle has normal function. The left ventricular  internal cavity size was normal in size.   Right Ventricle: The right ventricular size is normal. Right ventricular  systolic function is normal.   Left Atrium: Left atrial size was moderately dilated. No left atrial/left  atrial appendage thrombus was detected.   Right Atrium: Right atrial size was normal in size.   Pericardium: There is no evidence of pericardial effusion.   Mitral Valve: The mitral valve is abnormal. There is severe holosystolic  prolapse of the middle scallop of the posterior leaflet of the mitral  valve. Severe mitral valve regurgitation.   Tricuspid Valve: The tricuspid valve is normal in structure. Tricuspid  valve regurgitation is mild.   Aortic Valve: The aortic valve is tricuspid. Aortic valve regurgitation is  not visualized.   Pulmonic Valve: The pulmonic valve was normal in structure. Pulmonic valve  regurgitation is trivial.   Aorta: Aortic dilatation noted. There is mild dilatation of the aortic  root, measuring 44 mm. There is mild dilatation of the ascending aorta,  measuring 42  mm.   IAS/Shunts: The interatrial septum is aneurysmal. No atrial level shunt  detected by color flow Doppler.   Additional Comments: Flail P2 segement of posterior MV leaflet with severe  eccentric MR.       AORTA  Ao Root diam: 4.40 cm   Olga MillersBrian Crenshaw MD  Electronically signed by Olga MillersBrian Crenshaw MD  Signature Date/Time: 01/16/2021/2:33:04 PM      Impression:  Patient has mitral valve prolapse with stage C severe asymptomatic primary mitral regurgitation.  He also has a history of recurrent paroxysmal atrial fibrillation although for the most part he remains in sinus rhythm since 1 episode of atrial fibrillation last July.  I have personally reviewed the patient's recent transthoracic and transesophageal echocardiograms.  He has myxomatous degenerative disease of the mitral valve with an obvious flail segment involving a portion of the middle scallop (P2) of the posterior leaflet.  There appears to be severe mitral regurgitation with an eccentric jet that courses around the anterior aspect of the left atrium.  There is moderate left atrial enlargement.  Left ventricular size and systolic function appears normal.  The aortic root is little bit dilated but otherwise appears normal and the aortic valve appears normal.  Previous measurements of the aortic root have remained stable for the past 10 years.  Options include continued close observation on medical therapy versus elective mitral valve repair possibly with concomitant Maze procedure.  Based upon review of the patient's transesophageal echocardiogram I feel there is very high likelihood of durable mitral valve repair.  Operative risks would likely be very low although the patient will need to undergo diagnostic cardiac catheterization and probably should undergo gated cardiac CT angiogram to further evaluate the size and anatomical characteristics of the aortic root.   Plan:  The patient and his wife were counseled at length  regarding his diagnosis of severe primary mitral regurgitation.  We reviewed the results of their diagnostic tests including images from the most recent transesophageal echocardiogram.  We discussed the natural history of mitral regurgitation as well as alternative treatment strategies.  We discussed the impact of his age, current state of health, and any significant comorbid medical problems on clinical decision making.  We went on to discuss the indications, risks and potential  benefits of mitral valve repair as well as the timing of surgical intervention.  The rationale for elective surgery has been explained, including a comparison between surgery and continued medical therapy with close follow-up.  The likelihood of successful and durable mitral valve repair has been discussed with particular reference to the findings of the most recent echocardiogram.  Based upon these findings and previous experience, I have quoted a greater than 98 percent likelihood of successful valve repair with less than 1 percent risk of mortality or major morbidity.  The relative risks and benefits of performing a maze procedure at the time of their surgery was discussed at length, including the expected likelihood of long term freedom from recurrence of atrial fibrillation.  We also discussed the fact that I will be leaving [facility] in the near future and that he will not have completely recovered from surgery by the time I quit practicing locally.  The possibility of referral to a tertiary care center has been discussed.  All the questions been addressed.  The patient is eager to proceed with elective mitral valve repair and Maze procedure as soon as practical.  We tentatively plan to proceed with surgery on March 13, 2021.  The patient will need to undergo left and right heart catheterization.  We will also plan gated CT angiogram of the heart to further evaluate the size and anatomical characteristics of the patient's aortic  root.  The patient will need CT angiography of the aorta and iliac vessels to evaluate the feasibility of peripheral cannulation for surgery.  All of his questions been addressed.  The patient will return to our office for follow-up prior to surgery on March 06, 2021.   I spent in excess of 90 minutes during the conduct of this office consultation and >50% of this time involved direct face-to-face encounter with the patient for counseling and/or coordination of their care.    Salvatore Decent. Cornelius Moras, MD 02/07/2021 2:22 PM

## 2021-02-07 NOTE — Patient Instructions (Signed)
Continue all previous medications without any changes at this time  

## 2021-02-09 ENCOUNTER — Telehealth: Payer: Self-pay | Admitting: *Deleted

## 2021-02-09 NOTE — Telephone Encounter (Signed)
No answer, voicemail message. 

## 2021-02-09 NOTE — Telephone Encounter (Signed)
Reviewed procedure/mask/visitor instructions with patient.  Patient did not understand that he needed to get BMP/CBC prior to procedure 02/10/21, he will be unable to get lab today due to his schedule, will plan to arrive at [facility] tomorrow 9 AM for BMP/CBC prior to procedure at 12 Noon.

## 2021-02-09 NOTE — Telephone Encounter (Addendum)
Pt contacted pre-catheterization scheduled at [facility] for: Friday Feb 10, 2021 12 Noon Verified arrival time and place: [facility] Main Entrance A [facility]) at: 10 AM   No solid food after midnight prior to cath, clear liquids until 5 AM day of procedure.  Hold: Eliquis-none 5/245/22 until post procedure  Except hold medications AM meds can be  taken pre-cath with sips of water including: yes ASA 81 mg   Confirmed patient has responsible adult to drive home post procedure and be with patient first 24 hours after arriving home:  You are allowed ONE visitor in the waiting room during the time you are at the hospital for your procedure. Both you and your visitor must wear a mask once you enter the hospital.   Patient reports does not currently have any symptoms concerning for COVID-19 and no household members with COVID-19 like illness.      Ask patient if he has had lab done.              LMTCB to review procedure instructions with patient.

## 2021-02-10 ENCOUNTER — Ambulatory Visit (HOSPITAL_COMMUNITY)
Admission: RE | Admit: 2021-02-10 | Discharge: 2021-02-10 | Disposition: A | Discharge status: Home / Self Care | Payer: Medicare Other | Attending: Cardiovascular Disease | Admitting: Cardiovascular Disease

## 2021-02-10 ENCOUNTER — Other Ambulatory Visit: Payer: Self-pay

## 2021-02-10 ENCOUNTER — Encounter (HOSPITAL_COMMUNITY): Payer: Self-pay | Admitting: Cardiovascular Disease

## 2021-02-10 ENCOUNTER — Encounter (HOSPITAL_COMMUNITY)
Admission: RE | Disposition: A | Discharge status: Home / Self Care | Payer: Self-pay | Source: Home / Self Care | Attending: Cardiovascular Disease

## 2021-02-10 DIAGNOSIS — Z79899 Other long term (current) drug therapy: Secondary | ICD-10-CM | POA: Diagnosis not present

## 2021-02-10 DIAGNOSIS — I341 Nonrheumatic mitral (valve) prolapse: Secondary | ICD-10-CM | POA: Diagnosis not present

## 2021-02-10 DIAGNOSIS — I480 Paroxysmal atrial fibrillation: Secondary | ICD-10-CM | POA: Diagnosis not present

## 2021-02-10 DIAGNOSIS — I340 Nonrheumatic mitral (valve) insufficiency: Secondary | ICD-10-CM | POA: Diagnosis not present

## 2021-02-10 DIAGNOSIS — Z8616 Personal history of COVID-19: Secondary | ICD-10-CM | POA: Insufficient documentation

## 2021-02-10 DIAGNOSIS — I712 Thoracic aortic aneurysm, without rupture: Secondary | ICD-10-CM | POA: Diagnosis not present

## 2021-02-10 DIAGNOSIS — Z7901 Long term (current) use of anticoagulants: Secondary | ICD-10-CM | POA: Insufficient documentation

## 2021-02-10 HISTORY — PX: RIGHT/LEFT HEART CATH AND CORONARY ANGIOGRAPHY: CATH118266

## 2021-02-10 HISTORY — PX: CARDIAC CATHETERIZATION: SHX172

## 2021-02-10 LAB — CBC
HCT: 47.1 % (ref 39.0–52.0)
Hemoglobin: 15 g/dL (ref 13.0–17.0)
MCH: 30.1 pg (ref 26.0–34.0)
MCHC: 31.8 g/dL (ref 30.0–36.0)
MCV: 94.4 fL (ref 80.0–100.0)
Platelets: 200 10*3/uL (ref 150–400)
RBC: 4.99 MIL/uL (ref 4.22–5.81)
RDW: 13.9 % (ref 11.5–15.5)
WBC: 5.6 10*3/uL (ref 4.0–10.5)
nRBC: 0 % (ref 0.0–0.2)

## 2021-02-10 LAB — POCT I-STAT EG7
Acid-base deficit: 1 mmol/L (ref 0.0–2.0)
Acid-base deficit: 1 mmol/L (ref 0.0–2.0)
Bicarbonate: 22.8 mmol/L (ref 20.0–28.0)
Bicarbonate: 24.2 mmol/L (ref 20.0–28.0)
Calcium, Ion: 1.2 mmol/L (ref 1.15–1.40)
Calcium, Ion: 1.21 mmol/L (ref 1.15–1.40)
HCT: 44 % (ref 39.0–52.0)
HCT: 44 % (ref 39.0–52.0)
Hemoglobin: 15 g/dL (ref 13.0–17.0)
Hemoglobin: 15 g/dL (ref 13.0–17.0)
O2 Saturation: 74 %
O2 Saturation: 97 %
Potassium: 3.8 mmol/L (ref 3.5–5.1)
Potassium: 3.8 mmol/L (ref 3.5–5.1)
Sodium: 143 mmol/L (ref 135–145)
Sodium: 143 mmol/L (ref 135–145)
TCO2: 24 mmol/L (ref 22–32)
TCO2: 25 mmol/L (ref 22–32)
pCO2, Ven: 36 mmHg — ABNORMAL LOW (ref 44.0–60.0)
pCO2, Ven: 41.3 mmHg — ABNORMAL LOW (ref 44.0–60.0)
pH, Ven: 7.375 (ref 7.250–7.430)
pH, Ven: 7.41 (ref 7.250–7.430)
pO2, Ven: 41 mmHg (ref 32.0–45.0)
pO2, Ven: 90 mmHg — ABNORMAL HIGH (ref 32.0–45.0)

## 2021-02-10 LAB — BASIC METABOLIC PANEL
Anion gap: 7 (ref 5–15)
BUN: 15 mg/dL (ref 8–23)
CO2: 26 mmol/L (ref 22–32)
Calcium: 9.5 mg/dL (ref 8.9–10.3)
Chloride: 106 mmol/L (ref 98–111)
Creatinine, Ser: 1.08 mg/dL (ref 0.61–1.24)
GFR, Estimated: >60 mL/min (ref >60)
Glucose, Bld: 98 mg/dL (ref 70–99)
Potassium: 4.2 mmol/L (ref 3.5–5.1)
Sodium: 139 mmol/L (ref 135–145)

## 2021-02-10 SURGERY — RIGHT/LEFT HEART CATH AND CORONARY ANGIOGRAPHY
Anesthesia: LOCAL

## 2021-02-10 MED ORDER — SODIUM CHLORIDE 0.9 % WEIGHT BASED INFUSION
3.0000 mL/kg/h | INTRAVENOUS | Status: AC
  Administered 2021-02-10: 3 mL/kg/h via INTRAVENOUS

## 2021-02-10 MED ORDER — SODIUM CHLORIDE 0.9% FLUSH: 3.0000 mL | INTRAVENOUS | Status: DC | PRN

## 2021-02-10 MED ORDER — SODIUM CHLORIDE 0.9 % WEIGHT BASED INFUSION: 1.0000 mL/kg/h | INTRAVENOUS | Status: DC

## 2021-02-10 MED ORDER — VERAPAMIL HCL 2.5 MG/ML IV SOLN
INTRAVENOUS | Status: AC
  Filled 2021-02-10: qty 2

## 2021-02-10 MED ORDER — SODIUM CHLORIDE 0.9 % IV SOLN: 250.0000 mL | INTRAVENOUS | Status: DC | PRN

## 2021-02-10 MED ORDER — ASPIRIN 81 MG PO CHEW
81.0000 mg | CHEWABLE_TABLET | ORAL | Status: DC
Start: 2021-02-11 — End: 2021-02-10

## 2021-02-10 MED ORDER — HEPARIN SODIUM (PORCINE) 1000 UNIT/ML IJ SOLN
INTRAMUSCULAR | Status: DC | PRN
  Administered 2021-02-10: 5000 [IU] via INTRAVENOUS

## 2021-02-10 MED ORDER — FENTANYL CITRATE (PF) 100 MCG/2ML IJ SOLN
INTRAMUSCULAR | Status: AC
  Filled 2021-02-10: qty 2

## 2021-02-10 MED ORDER — HEPARIN (PORCINE) IN NACL 1000-0.9 UT/500ML-% IV SOLN
INTRAVENOUS | Status: DC | PRN
  Administered 2021-02-10 (×2): 500 mL

## 2021-02-10 MED ORDER — VERAPAMIL HCL 2.5 MG/ML IV SOLN
INTRAVENOUS | Status: DC | PRN
  Administered 2021-02-10: 10 mL via INTRA_ARTERIAL

## 2021-02-10 MED ORDER — SODIUM CHLORIDE 0.9% FLUSH: 3.0000 mL | Freq: Two times a day (BID) | INTRAVENOUS | Status: DC

## 2021-02-10 MED ORDER — ACETAMINOPHEN 325 MG PO TABS: 650.0000 mg | ORAL_TABLET | ORAL | Status: DC | PRN

## 2021-02-10 MED ORDER — HYDRALAZINE HCL 20 MG/ML IJ SOLN: 10.0000 mg | INTRAMUSCULAR | Status: DC | PRN

## 2021-02-10 MED ORDER — FENTANYL CITRATE (PF) 100 MCG/2ML IJ SOLN
INTRAMUSCULAR | Status: DC | PRN
  Administered 2021-02-10: 25 ug via INTRAVENOUS

## 2021-02-10 MED ORDER — LIDOCAINE HCL (PF) 1 % IJ SOLN
INTRAMUSCULAR | Status: AC
  Filled 2021-02-10: qty 30

## 2021-02-10 MED ORDER — LIDOCAINE HCL (PF) 1 % IJ SOLN
INTRAMUSCULAR | Status: DC | PRN
  Administered 2021-02-10: 2 mL via INTRADERMAL
  Administered 2021-02-10: 5 mL via INTRADERMAL

## 2021-02-10 MED ORDER — ASPIRIN 81 MG PO CHEW
81.0000 mg | CHEWABLE_TABLET | ORAL | Status: DC
Start: 2021-02-10 — End: 2021-02-10

## 2021-02-10 MED ORDER — MIDAZOLAM HCL 2 MG/2ML IJ SOLN
INTRAMUSCULAR | Status: AC
  Filled 2021-02-10: qty 2

## 2021-02-10 MED ORDER — IOHEXOL 350 MG/ML SOLN
INTRAVENOUS | Status: DC | PRN
  Administered 2021-02-10: 45 mL

## 2021-02-10 MED ORDER — HEPARIN SODIUM (PORCINE) 1000 UNIT/ML IJ SOLN
INTRAMUSCULAR | Status: AC
  Filled 2021-02-10: qty 1

## 2021-02-10 MED ORDER — MIDAZOLAM HCL 2 MG/2ML IJ SOLN
INTRAMUSCULAR | Status: DC | PRN
  Administered 2021-02-10: 1 mg via INTRAVENOUS

## 2021-02-10 MED ORDER — ONDANSETRON HCL 4 MG/2ML IJ SOLN: 4.0000 mg | Freq: Four times a day (QID) | INTRAMUSCULAR | Status: DC | PRN

## 2021-02-10 MED ORDER — LABETALOL HCL 5 MG/ML IV SOLN: 10.0000 mg | INTRAVENOUS | Status: DC | PRN

## 2021-02-10 MED ORDER — HEPARIN (PORCINE) IN NACL 1000-0.9 UT/500ML-% IV SOLN
INTRAVENOUS | Status: AC
  Filled 2021-02-10: qty 1000

## 2021-02-10 SURGICAL SUPPLY — 11 items

## 2021-02-10 NOTE — Discharge Instructions (Signed)
Radial Site Care Elevate wrist 24 hours   This sheet gives you information about how to care for yourself after your procedure. Your health care provider may also give you more specific instructions. If you have problems or questions, contact your health care provider. What can I expect after the procedure? After the procedure, it is common to have:  Bruising and tenderness at the catheter insertion area. Follow these instructions at home: Medicines  Take over-the-counter and prescription medicines only as told by your health care provider. Insertion site care  Follow instructions from your health care provider about how to take care of your insertion site. Make sure you: ? Wash your hands with soap and water before you change your bandage (dressing). If soap and water are not available, use hand sanitizer. ? Change your dressing as told by your health care provider. ? Leave stitches (sutures), skin glue, or adhesive strips in place. These skin closures may need to stay in place for 2 weeks or longer. If adhesive strip edges start to loosen and curl up, you may trim the loose edges. Do not remove adhesive strips completely unless your health care provider tells you to do that.  Check your insertion site every day for signs of infection. Check for: ? Redness, swelling, or pain. ? Fluid or blood. ? Pus or a bad smell. ? Warmth.  Do not take baths, swim, or use a hot tub until your health care provider approves.  You may shower 24-48 hours after the procedure, or as directed by your health care provider. ? Remove the dressing and gently wash the site with plain soap and water. ? Pat the area dry with a clean towel. ? Do not rub the site. That could cause bleeding.  Do not apply powder or lotion to the site. Activity  For 24 hours after the procedure, or as directed by your health care provider: ? Do not flex or bend the affected arm. ? Do not push or pull heavy objects with the  affected arm. ? Do not drive yourself home from the hospital or clinic. You may drive 24 hours after the procedure unless your health care provider tells you not to. ? Do not operate machinery or power tools.  Do not lift anything that is heavier than 10 lb (4.5 kg), or the limit that you are told, until your health care provider says that it is safe.  Ask your health care provider when it is okay to: ? Return to work or school. ? Resume usual physical activities or sports. ? Resume sexual activity.   General instructions  If the catheter site starts to bleed, raise your arm and put firm pressure on the site. If the bleeding does not stop, get help right away. This is a medical emergency.  If you went home on the same day as your procedure, a responsible adult should be with you for the first 24 hours after you arrive home.  Keep all follow-up visits as told by your health care provider. This is important. Contact a health care provider if:  You have a fever.  You have redness, swelling, or yellow drainage around your insertion site. Get help right away if:  You have unusual pain at the radial site.  The catheter insertion area swells very fast.  The insertion area is bleeding, and the bleeding does not stop when you hold steady pressure on the area.  Your arm or hand becomes pale, cool, tingly, or numb. These  symptoms may represent a serious problem that is an emergency. Do not wait to see if the symptoms will go away. Get medical help right away. Call your local emergency services (911 in the [place] Do not drive yourself to the hospital. Summary  After the procedure, it is common to have bruising and tenderness at the site.  Follow instructions from your health care provider about how to take care of your radial site wound. Check the wound every day for signs of infection.  Do not lift anything that is heavier than 10 lb (4.5 kg), or the limit that you are told, until your  health care provider says that it is safe. This information is not intended to replace advice given to you by your health care provider. Make sure you discuss any questions you have with your health care provider. Document Revised: 10/09/2017 Document Reviewed: 10/09/2017 Elsevier Patient Education  2021 [employer]vier Inc.  

## 2021-02-10 NOTE — Interval H&P Note (Signed)
History and Physical Interval Note:  02/10/2021 12:17 PM  Johnny [name]  has presented today for surgery, with the diagnosis of mitral valve disorder.  The various methods of treatment have been discussed with the patient and family. After consideration of risks, benefits and other options for treatment, the patient has consented to  Procedure(s): RIGHT/LEFT HEART CATH AND CORONARY ANGIOGRAPHY (N/A) as a surgical intervention.  The patient's history has been reviewed, patient examined, no change in status, stable for surgery.  I have reviewed the patient's chart and labs.  Questions were answered to the patient's satisfaction.    TEE completed, pt has seen Dr Cornelius Moras for surgical consult. Presenting today for R/L heart cath for preop evaluation.   Johnny [name]

## 2021-02-14 ENCOUNTER — Encounter (HOSPITAL_COMMUNITY): Payer: Self-pay | Admitting: Cardiovascular Disease

## 2021-02-16 ENCOUNTER — Other Ambulatory Visit: Payer: Self-pay

## 2021-02-17 ENCOUNTER — Encounter: Payer: Self-pay | Admitting: Medical

## 2021-02-17 ENCOUNTER — Ambulatory Visit (INDEPENDENT_AMBULATORY_CARE_PROVIDER_SITE_OTHER): Payer: Medicare Other | Admitting: Medical

## 2021-02-17 ENCOUNTER — Other Ambulatory Visit: Payer: Self-pay

## 2021-02-17 VITALS — BP 117/77 | HR 100 | Temp 97.5°F | Resp 20 | Ht 74.0 in | Wt 236.8 lb

## 2021-02-17 DIAGNOSIS — Z1211 Encounter for screening for malignant neoplasm of colon: Secondary | ICD-10-CM | POA: Diagnosis not present

## 2021-02-17 DIAGNOSIS — E538 Deficiency of other specified B group vitamins: Secondary | ICD-10-CM | POA: Diagnosis not present

## 2021-02-17 DIAGNOSIS — R739 Hyperglycemia, unspecified: Secondary | ICD-10-CM

## 2021-02-17 DIAGNOSIS — E785 Hyperlipidemia, unspecified: Secondary | ICD-10-CM

## 2021-02-17 DIAGNOSIS — Z8679 Personal history of other diseases of the circulatory system: Secondary | ICD-10-CM

## 2021-02-17 DIAGNOSIS — R5383 Other fatigue: Secondary | ICD-10-CM

## 2021-02-17 DIAGNOSIS — I340 Nonrheumatic mitral (valve) insufficiency: Secondary | ICD-10-CM

## 2021-02-17 DIAGNOSIS — I480 Paroxysmal atrial fibrillation: Secondary | ICD-10-CM

## 2021-02-17 DIAGNOSIS — Z125 Encounter for screening for malignant neoplasm of prostate: Secondary | ICD-10-CM

## 2021-02-17 NOTE — Progress Notes (Signed)
Subjective:    Patient ID: Johnny [name], male    DOB: 1956-06-23, 65 y.o.   MRN: [id number]  HPI  Pt in for first time.  Pt states his former providers got sick/cancer. He saw Sandford Craze briefly. Pt had  Atrial Fibrillation in October. Pt works with his daughter. Pt works for [employer]. Worked in [employer].     Pt is on propanolol and eliquis. Pt has seen Dr. Jens Som in past.   Early this year had covid. About 5 days into having covid felt weak and spacey. He had syncope. Admitted and work up showed no cardiac cause for passing out. But cardiologist had concern for mitral insufficiency. Pt told likely from rheumatic fever as child.  INDICATION: mitral regurgitation, preoperative study  PROCEDURAL DETAILS: There was an indwelling IV in a right antecubital vein. Using normal sterile technique, the IV was changed out for a 5 Fr brachial sheath over a 0.018 inch wire. The right wrist was then prepped, draped, and anesthetized with 1% lidocaine. Using the modified Seldinger technique a 5/6 French Slender sheath was placed in the right radial artery. Intra-arterial verapamil was administered through the radial artery sheath. IV heparin was administered after a JR4 catheter was advanced into the central aorta. A Swan-Ganz catheter was used for the right heart catheterization. Standard protocol was followed for recording of right heart pressures and sampling of oxygen saturations. Fick cardiac output was calculated. Standard Judkins catheters were used for selective coronary angiography. LV pressure is recorded and an aortic valve pullback is performed. There were no immediate procedural complications. The patient was transferred to the post catheterization recovery area for further monitoring.  Pt is going to have procedure to be done by Dr. Cornelius Moras. Dr. Cornelius Moras will be moving to [place] soon.    Pt never had colonoscopy. Mom passes away lung  condition.    Review of Systems  Constitutional: Positive for fatigue. Negative for chills and fever.  Respiratory: Negative for cough, chest tightness, shortness of breath and wheezing.   Cardiovascular: Negative for chest pain and palpitations.  Gastrointestinal: Negative for abdominal pain, blood in stool, diarrhea and nausea.  Genitourinary: Negative for dysuria, flank pain, genital sores, testicular pain and urgency.  Musculoskeletal: Negative for back pain, joint swelling and neck pain.  Skin: Negative for rash.  Neurological: Negative for dizziness and facial asymmetry.  Hematological: Negative for adenopathy. Does not bruise/bleed easily.  Psychiatric/Behavioral: Negative for behavioral problems, confusion and suicidal ideas. The patient is not nervous/anxious.      Past Medical History:  Diagnosis Date  . Atrial fibrillation (HCC)   . Chronic insomnia   . History of rheumatic fever    age 69  . Mitral regurgitation   . Patent foramen ovale   . Thoracic aortic aneurysm [facility])        Past Surgical History:  Procedure Laterality Date  . CARDIOVERSION    . RIGHT/LEFT HEART CATH AND CORONARY ANGIOGRAPHY N/A 02/10/2021   Procedure: RIGHT/LEFT HEART CATH AND CORONARY ANGIOGRAPHY;  Surgeon: Tonny Bollman, MD;  Location: [facility] INVASIVE CV LAB;  Service: Cardiovascular;  Laterality: N/A;  . TEE WITHOUT CARDIOVERSION N/A 01/16/2021   Procedure: TRANSESOPHAGEAL ECHOCARDIOGRAM (TEE);  Surgeon: Lewayne Bunting, MD;  Location: [facility] ENDOSCOPY;  Service: Cardiovascular;  Laterality: N/A;    Family History  Problem Relation Age of Onset  . Pulmonary fibrosis Mother   . Diverticulosis Father     Allergies  Allergen Reactions  . Bee Venom  Anaphylaxis  . Antihistamines, Chlorpheniramine-Type Other (See Comments)    Acts as a depressant with pt.    Current Outpatient Medications on File Prior to Visit  Medication Sig Dispense Refill  . Cholecalciferol (VITAMIN D3) 250 MCG (10000  UT) capsule Take 10,000 Units by mouth in the morning.    Marland Kitchen ELIQUIS 5 MG TABS tablet Take 1 tablet by mouth twice daily (Patient taking differently: Take 5 mg by mouth 2 (two) times daily.) 180 tablet 1  . Hydroxocobalamin Acetate 1000 MCG/ML SOLN Inject 1,000 mcg into the muscle 3 (three) times a week.    Marland Kitchen MAGNESIUM PO Take 1 tablet by mouth daily.    . metoprolol succinate (TOPROL-XL) 25 MG 24 hr tablet Take 1 tablet (25 mg total) by mouth daily. (Patient taking differently: Take 25 mg by mouth in the morning.) 90 tablet 3  . Multiple Vitamin (MULTIVITAMIN WITH MINERALS) TABS tablet Take 1 tablet by mouth in the morning.    . propranolol (INDERAL) 10 MG tablet Take 1 tablet (10 mg total) by mouth 4 (four) times daily as needed (atrial fib). 90 tablet 1  . Zinc 50 MG TABS Take 50 mg by mouth daily.     Current Facility-Administered Medications on File Prior to Visit  Medication Dose Route Frequency Provider Last Rate Last Admin  . sodium chloride flush (NS) 0.9 % injection 3 mL  3 mL Intravenous Q12H Jens Som, Madolyn Frieze, MD        BP 117/77   Pulse 100   Temp (!) 97.5 F (36.4 C)   Resp 20   Ht 6\' 2"  (1.88 m)   Wt 236 lb 12.8 oz (107.4 kg)   SpO2 96%   BMI 30.40 kg/m       Objective:   Physical Exam  General Mental Status- Alert. General Appearance- Not in acute distress.   Skin General: Color- Normal Color. Moisture- Normal Moisture.  Neck Carotid Arteries- Normal color. Moisture- Normal Moisture. No carotid bruits. No JVD. No thyromegaly.  Chest and Lung Exam Auscultation: Breath Sounds:-Normal.  Cardiovascular Auscultation:Rythm- Regular. Murmurs & Other Heart Sounds:Auscultation of the heart reveals- No Murmurs.  Abdomen Inspection:-Inspeection Normal. Palpation/Percussion:Note:No mass. Palpation and Percussion of the abdomen reveal- Non Tender, Non Distended + BS, no rebound or guarding.   Neurologic Cranial Nerve exam:- CN III-XII intact(No nystagmus),  symmetric smile. Strength:- 5/5 equal and symmetric strength both upper and lower extremities.  Lower ext- no pedal edema.    Assessment & Plan:  PAF and mitral reguritation history. Continue b blocker and eliquis. Getting ready for mitral valve procedure.  For slight high ldl and mild sugar place cmp, lipid panel and a1c.  Placed future psa lab.  Refer to gi for colonoscopy.  For fatigue cbc,  tsh, t4 and b12 level.  Get labs schedule future and fasting.   Follow up 3 month or as needed    , PA-C   Time spent with patient today was 45  minutes which consisted of chart review, discussing diagnoses, work up, answering questions, discussing   curreent treatment and documentation.

## 2021-02-17 NOTE — Patient Instructions (Addendum)
PAF and mitral reguritation history. Continue b blocker and eliquis. Getting ready for mitral valve procedure.  For slight high ldl and mild sugar place cmp, lipid panel and a1c.  Placed future psa lab.  Refer to gi for colonoscopy.  For fatigue cbc,  tsh, t4 and b12 level.  Get labs schedule future and fasting.  Follow up 3 month or as needed

## 2021-02-21 ENCOUNTER — Telehealth (HOSPITAL_COMMUNITY): Payer: Self-pay | Admitting: Emergency Medicine

## 2021-02-21 NOTE — Telephone Encounter (Signed)
Attempted to call patient regarding upcoming cardiac CT appointment. °Left message on voicemail with name and callback number °Lazaro Isenhower RN Navigator Cardiac Imaging °[facility] °[phone] Office °[phone] Cell ° °

## 2021-02-22 ENCOUNTER — Ambulatory Visit (HOSPITAL_COMMUNITY)
Admission: RE | Admit: 2021-02-22 | Discharge: 2021-02-22 | Disposition: A | Discharge status: Home / Self Care | Payer: Medicare Other | Source: Ambulatory Visit | Attending: Thoracic Surgery (Cardiothoracic Vascular Surgery) | Admitting: Thoracic Surgery (Cardiothoracic Vascular Surgery)

## 2021-02-22 ENCOUNTER — Other Ambulatory Visit: Payer: Self-pay

## 2021-02-22 DIAGNOSIS — K5730 Diverticulosis of large intestine without perforation or abscess without bleeding: Secondary | ICD-10-CM | POA: Diagnosis not present

## 2021-02-22 DIAGNOSIS — I712 Thoracic aortic aneurysm, without rupture, unspecified: Secondary | ICD-10-CM

## 2021-02-22 DIAGNOSIS — I340 Nonrheumatic mitral (valve) insufficiency: Secondary | ICD-10-CM | POA: Diagnosis not present

## 2021-02-22 DIAGNOSIS — I341 Nonrheumatic mitral (valve) prolapse: Secondary | ICD-10-CM | POA: Insufficient documentation

## 2021-02-22 MED ORDER — IOHEXOL 350 MG/ML SOLN
100.0000 mL | Freq: Once | INTRAVENOUS | Status: AC | PRN
  Administered 2021-02-22: 100 mL via INTRAVENOUS

## 2021-03-08 ENCOUNTER — Telehealth (INDEPENDENT_AMBULATORY_CARE_PROVIDER_SITE_OTHER): Payer: Medicare Other | Admitting: Thoracic Surgery (Cardiothoracic Vascular Surgery)

## 2021-03-08 ENCOUNTER — Other Ambulatory Visit: Payer: Self-pay

## 2021-03-08 ENCOUNTER — Encounter: Payer: Self-pay | Admitting: Thoracic Surgery (Cardiothoracic Vascular Surgery)

## 2021-03-08 DIAGNOSIS — I480 Paroxysmal atrial fibrillation: Secondary | ICD-10-CM

## 2021-03-08 DIAGNOSIS — I340 Nonrheumatic mitral (valve) insufficiency: Secondary | ICD-10-CM | POA: Diagnosis not present

## 2021-03-08 NOTE — Patient Instructions (Signed)
Stop taking Eliquis  Continue taking all other current medications without change through the day before surgery.  Make sure to bring all of your medications with you when you come for your Pre-Admission Testing appointment at [facility]Stay [facility].  Have nothing to eat or drink after midnight the night before surgery.  On the morning of surgery do not take any medications.  At your appointment for Pre-Admission Testing at the [facility] Short-Stay Department you will be asked to sign permission forms for your upcoming surgery.  By definition your signature on these forms implies that you and/or your designee provide full informed consent for your planned surgical procedure(s), that alternative treatment options have been discussed, that you understand and accept any and all potential risks, and that you have some understanding of what to expect for your post-operative convalescence.  For elective mitral valve repair or replacement potential operative risks include but are not limited to at least some risk of death, stroke or other neurologic complication, myocardial infarction, congestive heart failure, respiratory failure, renal failure, bleeding requiring transfusion and/or reexploration, arrhythmia, heart block or bradycardia requiring permanent pacemaker insertion, infection or other wound complications, pneumonia, pleural and/or pericardial effusion, pulmonary embolus, aortic dissection or other major vascular complication, or other immediate or delayed complications related to valve repair or replacement including but not limited to recurrent or persistent mitral regurgitation and/or mitral stenosis, LV outflow tract obstruction, aortic insufficiency, paravalvular leak, posterior AV groove disruption, late structural valve deterioration and failure, thrombosis, embolization, or endocarditis.  Specific risks potentially related to the  minimally-invasive approach include but are not limited to risk of conversion to full or partial sternotomy, aortic dissection or other major vascular complication, unilateral acute lung injury or pulmonary edema, phrenic nerve dysfunction or paralysis, rib fracture, chronic pain, lung hernia, or lymphocele.  Please call to schedule a follow-up appointment in our office prior to surgery if you have any unresolved questions about your planned surgical procedure, the associated risks, alternative treatment options, and/or expectations for your post-operative recovery.

## 2021-03-08 NOTE — Progress Notes (Signed)
[address]       [place] [address]             [phone]     CARDIOTHORACIC SURGERY TELEPHONE VIRTUAL OFFICE NOTE  Referring Provider is Jens Som, Madolyn Frieze, MD Primary Cardiologist is Olga Millers, MD PCP is Saguier, Kateri Mc   HPI:  I spoke with Johnny [name] (DOB 1956/06/02 ) via telephone on 03/08/2021 at 9:40 AM and verified that I was speaking with the correct person using more than one form of identification.  We discussed the fact that I was contacting them from my office and they were located at home, as well as the reason(s) for conducting our visit virtually instead of in-person.  The patient expressed understanding the circumstances and agreed to proceed as described.   Patient is a 65 year old male with history of mitral valve prolapse and mitral regurgitation, recurrent paroxysmal atrial fibrillation, and aneurysmal dilatation of the aortic root who has been seen in consultation recently for management of mitral valve prolapse with stage C severe asymptomatic primary mitral regurgitation.  The patient was seen in consultation on Feb 07, 2021 and since then underwent diagnostic cardiac catheterization and CT angiography with tentative plans to proceed with elective mitral valve repair next week.  Catheterization performed by Dr. Excell Seltzer on Feb 10, 2021 revealed normal coronary artery anatomy with no significant coronary artery disease.  The patient had normal cardiac output with normal right-sided pressures.  CT angiography confirmed the presence of stable mild aneurysmal enlargement of the aortic root and revealed no contraindications to peripheral cannulation for surgery.  I spoke with the patient and his wife over the telephone today to discuss the results of his recent CT angiogram and make final plans for surgery.  He reports no new problems or complaints over the last few weeks and he is eagerly anticipating surgery scheduled for next week.  He  specifically denies any symptoms of exertional shortness of breath or chest pain.  He has not had any fevers or cough.  He has not had any palpitations or other symptoms to suggest recurrence of atrial fibrillation.   Current Outpatient Medications  Medication Sig Dispense Refill   Cholecalciferol (VITAMIN D3) 250 MCG (10000 UT) capsule Take 10,000 Units by mouth in the morning.     ELIQUIS 5 MG TABS tablet Take 1 tablet by mouth twice daily (Patient taking differently: Take 5 mg by mouth 2 (two) times daily.) 180 tablet 1   Hydroxocobalamin Acetate 1000 MCG/ML SOLN Inject 1,000 mcg into the muscle 3 (three) times a week.     MAGNESIUM PO Take 12 mg by mouth daily.     metoprolol succinate (TOPROL-XL) 25 MG 24 hr tablet Take 1 tablet (25 mg total) by mouth daily. (Patient taking differently: Take 25 mg by mouth in the morning.) 90 tablet 3   Multiple Vitamin (MULTIVITAMIN WITH MINERALS) TABS tablet Take 1 tablet by mouth in the morning.     propranolol (INDERAL) 10 MG tablet Take 1 tablet (10 mg total) by mouth 4 (four) times daily as needed (atrial fib). 90 tablet 1   Zinc 50 MG TABS Take 50 mg by mouth daily.     Current Facility-Administered Medications  Medication Dose Route Frequency Provider Last Rate Last Admin   sodium chloride flush (NS) 0.9 % injection 3 mL  3 mL Intravenous Q12H Jens Som Madolyn Frieze, MD         Diagnostic Tests:  RIGHT/LEFT HEART  CATH AND CORONARY ANGIOGRAPHY    Conclusion  1. Widely patent coronary arteries with no obstructive CAD 2. Normal intracardiac hemodynamics with low diastolic filling pressures    Indications  Nonrheumatic mitral valve regurgitation [I34.0 (ICD-10-CM)]    Procedural Details  Technical Details INDICATION: mitral regurgitation, preoperative study  PROCEDURAL DETAILS: There was an indwelling IV in a right antecubital vein. Using normal sterile technique, the IV was changed out for a 5 Fr brachial sheath over a 0.018 inch wire.  The right wrist was then prepped, draped, and anesthetized with 1% lidocaine. Using the modified Seldinger technique a 5/6 French Slender sheath was placed in the right radial artery. Intra-arterial verapamil was administered through the radial artery sheath. IV heparin was administered after a JR4 catheter was advanced into the central aorta. A Swan-Ganz catheter was used for the right heart catheterization. Standard protocol was followed for recording of right heart pressures and sampling of oxygen saturations. Fick cardiac output was calculated. Standard Judkins catheters were used for selective coronary angiography. LV pressure is recorded and an aortic valve pullback is performed. There were no immediate procedural complications. The patient was transferred to the post catheterization recovery area for further monitoring.    Estimated blood loss <50 mL.   During this procedure medications were administered to achieve and maintain moderate conscious sedation while the patient's heart rate, blood pressure, and oxygen saturation were continuously monitored and I was present face-to-face 100% of this time.    Medications (Filter: Administrations occurring from 1222 to 1319 on 02/10/21)  important  Continuous medications are totaled by the amount administered until 02/10/21 1319.    Heparin (Porcine) in NaCl 1000-0.9 UT/500ML-% SOLN (mL) Total volume:  1,000 mL  Date/Time Rate/Dose/Volume Action   02/10/21 1224 500 mL Given   1224 500 mL Given    midazolam (VERSED) injection (mg) Total dose:  1 mg  Date/Time Rate/Dose/Volume Action   02/10/21 1228 1 mg Given    fentaNYL (SUBLIMAZE) injection (mcg) Total dose:  25 mcg  Date/Time Rate/Dose/Volume Action   02/10/21 1228 25 mcg Given    lidocaine (PF) (XYLOCAINE) 1 % injection (mL) Total volume:  7 mL  Date/Time Rate/Dose/Volume Action   02/10/21 1244 2 mL Given   1246 5 mL Given    heparin sodium (porcine) injection  (Units) Total dose:  5,000 Units  Date/Time Rate/Dose/Volume Action   02/10/21 1251 5,000 Units Given    Radial Cocktail/Verapamil only (mL) Total volume:  10 mL  Date/Time Rate/Dose/Volume Action   02/10/21 1247 10 mL Given    iohexol (OMNIPAQUE) 350 MG/ML injection (mL) Total volume:  45 mL  Date/Time Rate/Dose/Volume Action   02/10/21 1315 45 mL Given     Sedation Time  Sedation Time Physician-1: 35 minutes 34 seconds    Radiation/Fluoro  Fluoro time: 4.1 (min) DAP: 16730 (mGycm2) Cumulative Air Kerma: 292 (mGy)   Coronary Findings   Diagnostic Dominance: Right  Left Main  Vessel is angiographically normal.  Left Circumflex  Vessel is angiographically normal.  Right Coronary Artery  The vessel exhibits minimal luminal irregularities.   Intervention   No interventions have been documented.          Coronary Diagrams   Diagnostic Dominance: Right    Intervention     Implants     No implant documentation for this case.    Syngo Images   Show images for CARDIAC CATHETERIZATION  Images on Long Term Storage   Show images for Lorenda PeckHaege, Larry E  Link to Procedure Log  Procedure Log      Hemo Data  Flowsheet Row Most Recent Value  Fick Cardiac Output 6.47 L/min  Fick Cardiac Output Index 2.83 (L/min)/BSA  RA A Wave 5 mmHg  RA V Wave 3 mmHg  RA Mean 2 mmHg  RV Systolic Pressure 21 mmHg  RV Diastolic Pressure 3 mmHg  RV EDP 6 mmHg  PA Systolic Pressure 16 mmHg  PA Diastolic Pressure 6 mmHg  PA Mean 12 mmHg  PW A Wave 10 mmHg  PW V Wave 8 mmHg  PW Mean 7 mmHg  AO Systolic Pressure 94 mmHg  AO Diastolic Pressure 64 mmHg  AO Mean 78 mmHg  LV Systolic Pressure 92 mmHg  LV Diastolic Pressure 3 mmHg  LV EDP 8 mmHg  AOp Systolic Pressure 97 mmHg  AOp Diastolic Pressure 66 mmHg  AOp Mean Pressure 80 mmHg  LVp Systolic Pressure 96 mmHg  LVp Diastolic Pressure 5 mmHg  LVp EDP Pressure 8 mmHg  QP/QS 1  TPVR Index 4.24  HRUI  TSVR Index 27.54 HRUI  PVR SVR Ratio 0.07  TPVR/TSVR Ratio 0.15      Cardiac/Coronary  CT   TECHNIQUE: The patient was scanned on a [employer] scanner.   PROTOCOL: A 120 kV prospective scan was triggered in the descending thoracic aorta at 111 HU's. Axial non-contrast 3 mm slices were carried out through the heart. The data set was analyzed on a dedicated work station and scored using the Agatston method. Gantry rotation speed was 250 msecs and collimation was .6 mm. Beta blockade and 0.8 mg of sl NTG was given. The 3D data set was reconstructed in 5% intervals of the 35-75 % of the R-R cycle. Systolic and diastolic phases were analyzed on a dedicated work station using MPR, MIP and VRT modes. The patient received OMNIPAQUE IOHEXOL 350 MG/ML SOLN contrast.   FINDINGS: Image quality: Good.   Noise artifact is: Limited.   Coronary calcium score is 0.   Coronary arteries: Normal coronary origins. Right dominance. Nitroglycerin was not administered per protocol, and therefore this study cannot assess coronary luminal stenosis.   Aorta:   Measurements made in double oblique technique in diastole 75% R-R:   Sinus of Valsalva:   R-L: 49 mm   L-Non: 50 mm   R-Non: 49 mm   ST junction: 39 mm   Mid ascending aorta (at PA bifurcation): 37 mm   Aortic Valve: No calcifications. Tricuspid aortic valve with normal leaflet excursion.   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   Other findings:   Normal pulmonary vein drainage into the left atrium.   Normal left atrial appendage without thrombus.   Normal size of the main pulmonary artery.   Probable PFO with left to right shunt.   Calculated LVEF 62%.   IMPRESSION: 1. Severe aneurysmal dilation of the ascending thoracic aorta at level of sinus of Valsalva,  50 mm from left to non-coronary cusp.   2. Myxomatous mitral valve with prolapse of posterior mitral valve leaflet, with severe P2 prolapse and flail segment at P2, and prolapse of P3 scallop.   3. Normal coronary origin with right dominance. Coronary calcium score is 0.   4. Probable patent foramen ovale with left to right shunt.     Electronically Signed   By: Lynda Rainwater  Jacques Navy   On: 02/22/2021 16:49     CT ANGIOGRAPHY CHEST, ABDOMEN AND PELVIS   TECHNIQUE: Non-contrast CT of the chest was initially obtained.   Multidetector CT imaging through the chest, abdomen and pelvis was performed using the standard protocol during bolus administration of intravenous contrast. Multiplanar reconstructed images and MIPs were obtained and reviewed to evaluate the vascular anatomy.   CONTRAST:  OMNIPAQUE IOHEXOL 350 MG/ML SOLN   COMPARISON:  MRA 12/10/2020   FINDINGS: CTA CHEST FINDINGS   Cardiovascular: Aneurysmal dilatation of the aortic arch measuring up to 4.9 cm compared with 4.8 cm on prior MRA. Ascending thoracic aorta 3.7 cm. Distal aortic arch 3.3 cm. Proximal descending thoracic aorta 3.0 cm. No dissection. Heart is upper limits normal in size. Scattered calcifications in the distal aortic arch.   Mediastinum/Nodes: No mediastinal, hilar, or axillary adenopathy. Trachea and esophagus are unremarkable. Thyroid unremarkable.   Lungs/Pleura: Lungs are clear. No focal airspace opacities or suspicious nodules. No effusions.   Musculoskeletal: Chest wall soft tissues are unremarkable. No acute bony abnormality.   Review of the MIP images confirms the above findings.   CTA ABDOMEN AND PELVIS FINDINGS   VASCULAR   Aorta: No aneurysm or dissection. Proximal abdominal aorta diameter 2.5 cm. Mid abdominal aorta at the level of the renal arteries 2.0 cm. Distal abdominal aorta diameter 1.6 cm.   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  Mild ectasia of the vessel at this level measuring 1 cm. Vessels patent.   SMA: Patent without evidence of aneurysm, dissection, vasculitis or significant stenosis.   Renals: Both renal arteries are patent without evidence of aneurysm, dissection, vasculitis, fibromuscular dysplasia or significant stenosis.   IMA: Patent without evidence of aneurysm, dissection, vasculitis or significant stenosis.   Inflow: Scattered calcifications.  No aneurysm or dissection.   Veins: No obvious venous abnormality within the limitations of this arterial phase study.   Review of the MIP images confirms the above findings.   NON-VASCULAR   Hepatobiliary: No focal hepatic abnormality. Gallbladder unremarkable.   Pancreas: No focal abnormality or ductal dilatation.   Spleen: No focal abnormality.  Normal size.   Adrenals/Urinary Tract: No adrenal abnormality. Left lower pole parapelvic cyst. Otherwise no focal renal abnormality. No stones or hydronephrosis. Urinary bladder is unremarkable.   Stomach/Bowel: Diffuse colonic diverticulosis. No active diverticulitis. Appendix is normal. Stomach and small bowel decompressed, unremarkable.   Lymphatic: No adenopathy   Reproductive: Prostate enlargement.   Other: No free fluid or free air.   Musculoskeletal: No acute bony abnormality.   Review of the MIP images confirms the above findings.   IMPRESSION: Aneurysmal dilatation of the aortic root measuring 4.9 cm, not significantly changed since prior MRA. Otherwise, aorta normal caliber. No aortic dissection.   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 Slight ectasia of the vessel at this level, 1 cm.   No acute cardiopulmonary disease.   Diffuse colonic diverticulosis.  No active diverticulitis.     Electronically Signed   By: Charlett Nose M.D.   On: 02/22/2021 10:26     Impression:  Patient has mitral valve prolapse with  stage C severe asymptomatic primary mitral regurgitation.  He also has a history of recurrent paroxysmal atrial fibrillation although for the most part he remains in sinus rhythm since a single episode of atrial fibrillation last July.   I have personally reviewed the patient's recent transthoracic and transesophageal echocardiograms, catheterization and  CT angiograms.  He has myxomatous degenerative disease of the mitral valve with an obvious flail segment involving a portion of the middle scallop (P2) of the posterior leaflet.  There appears to be severe mitral regurgitation with an eccentric jet that courses around the anterior aspect of the left atrium.  There is moderate left atrial enlargement.  Left ventricular size and systolic function appears normal.  Catheterization revealed normal coronary artery anatomy with no significant coronary artery disease.  Right heart pressures were normal.  CT angiography reveals no contraindications to peripheral cannulation for surgery.  The aortic root is somewhat dilated but tapers to 39 mm at the sinotubular junction and the aortic valve appears normal.  Previous measurements of the aortic root have remained stable for the past 10 years.  I do not feel that aortic root replacement with or without valve sparing techniques is indicated at this time.   Options include continued close observation on medical therapy versus elective mitral valve repair possibly with concomitant Maze procedure.  Based upon review of the patient's transesophageal echocardiogram I feel there is very high likelihood of durable mitral valve repair and risks associated with surgery should be relatively low.     Plan:  The patient and his wife were again counseled regarding his diagnosis of severe primary mitral regurgitation.  We reviewed the results of his recent diagnostic tests including the most recent transesophageal echocardiogram, catheterization and CT angiogram.  We previously  discussed the natural history of mitral regurgitation as well as alternative treatment strategies and the impact of his age, current state of health, and any significant comorbid medical problems on clinical decision making.  The rationale for elective surgery has been explained, including a comparison between surgery and continued medical therapy with close follow-up.  The likelihood of successful and durable mitral valve repair has been discussed with particular reference to the findings of the most recent echocardiogram.  Based upon these findings and previous experience, I have quoted a greater than 98 percent likelihood of successful valve repair with less than 1 percent risk of mortality or major morbidity.  The relative risks and benefits of performing a maze procedure at the time of their surgery was discussed at length, including the expected likelihood of long term freedom from recurrence of atrial fibrillation.  Alternative surgical approaches have been discussed including a comparison between conventional sternotomy and minimally-invasive techniques.  The relative risks and benefits of each have been reviewed as they pertain to the patient's specific circumstances, and expectations for the patient's postoperative convalescence has been discussed.  We plan to proceed with surgery on Monday, March 13, 2021.  The patient has stopped taking Eliquis in anticipation of surgery.  The patient understands and accepts all potential risks of surgery including but not limited to risk of death, stroke or other neurologic complication, myocardial infarction, congestive heart failure, respiratory failure, renal failure, bleeding requiring transfusion and/or reexploration, arrhythmia, heart block or bradycardia requiring permanent pacemaker insertion, infection or other wound complications, pneumonia, pleural and/or pericardial effusion, pulmonary embolus, aortic dissection or other major vascular complication, or  other immediate or delayed complications related to valve repair or replacement including but not limited to recurrent or persistent mitral regurgitation and/or mitral stenosis, LV outflow tract obstruction, aortic insufficiency, paravalvular leak, posterior AV groove disruption, structural valve deterioration and failure, thrombosis, embolization, or endocarditis.  Specific risks potentially related to the minimally-invasive approach were discussed at length, including but not limited to risk of conversion to full or partial  sternotomy, aortic dissection or other major vascular complication, unilateral acute lung injury or pulmonary edema, phrenic nerve dysfunction or paralysis, rib fracture, chronic pain, lung hernia, or lymphocele. All of his questions have been answered.    I discussed limitations of evaluation and management via telephone.  The patient was advised to call back for repeat telephone consultation or to seek an in-person evaluation if questions arise or the patient's clinical condition changes in any significant manner.  I spent in excess of 30 minutes of non-face-to-face time during the conduct of this telephone virtual office consultation, including pre-visit review of the patient's records and direct conversation with the patient.      Salvatore Decent. Cornelius Moras, MD 03/08/2021 9:40 AM

## 2021-03-09 NOTE — Pre-Procedure Instructions (Signed)
Surgical Instructions    Your procedure is scheduled on Monday June 27th.  Report to [facility] Main Entrance "A" at 05:30 A.M., then check in with the Admitting office.  Call this number if you have problems the morning of surgery:  [phone]   If you have any questions prior to your surgery date call [phone]: Open Monday-Friday 8am-4pm    Remember:  Do not eat or drink after midnight the night before your surgery     Take these medicines the morning of surgery with A SIP OF WATER   metoprolol succinate (TOPROL-XL)  propranolol (INDERAL)   Please follow your surgeon's instructions on when to stop taking Eliquis. If you have not received instructions from your surgeon please contact the surgeon's office.    As of today, STOP taking any Aspirin (unless otherwise instructed by your surgeon) Aleve, Naproxen, Ibuprofen, Motrin, Advil, Goody's, BC's, all herbal medications, fish oil, and all vitamins.          Do not wear jewelry or makeup Do not wear lotions, powders, colognes, or deodorant. Do not shave 48 hours prior to surgery.  Men may shave face and neck. Do not bring valuables to the hospital. DO Not wear nail polish, gel polish, artificial nails, or any other type of covering on natural nails including finger and toenails. If patients have artificial nails, gel coating, etc. that need to be removed by a nail salon please have this removed prior to surgery or surgery may need to be canceled/delayed if the surgeon/ anesthesia feels like the patient is unable to be adequately monitored.             [facility] is not responsible for any belongings or valuables.  Do NOT Smoke (Tobacco/Vaping) or drink Alcohol 24 hours prior to your procedure If you use a CPAP at night, you may bring all equipment for your overnight stay.   Contacts, glasses, dentures or partials may not be worn into surgery, please bring cases for these belongings   For patients admitted to the hospital,  discharge time will be determined by your treatment team.   Patients discharged the day of surgery will not be allowed to drive home, and someone needs to stay with them for 24 hours.  ONLY 1 SUPPORT PERSON MAY BE PRESENT WHILE YOU ARE IN SURGERY. IF YOU ARE TO BE ADMITTED ONCE YOU ARE IN YOUR ROOM YOU WILL BE ALLOWED TWO (2) VISITORS.  Minor children may have two parents present. Special consideration for safety and communication needs will be reviewed on a case by case basis.  Special instructions:    Oral Hygiene is also important to reduce your risk of infection.  Remember - BRUSH YOUR TEETH THE MORNING OF SURGERY WITH YOUR REGULAR TOOTHPASTE   [place]- Preparing For Surgery  Before surgery, you can play an important role. Because skin is not sterile, your skin needs to be as free of germs as possible. You can reduce the number of germs on your skin by washing with CHG (chlorahexidine gluconate) Soap before surgery.  CHG is an antiseptic cleaner which kills germs and bonds with the skin to continue killing germs even after washing.     Please do not use if you have an allergy to CHG or antibacterial soaps. If your skin becomes reddened/irritated stop using the CHG.  Do not shave (including legs and underarms) for at least 48 hours prior to first CHG shower. It is OK to shave your face.  Please  follow these instructions carefully.     Shower the NIGHT BEFORE SURGERY and the MORNING OF SURGERY with CHG Soap.   If you chose to wash your hair, wash your hair first as usual with your normal shampoo. After you shampoo, rinse your hair and body thoroughly to remove the shampoo.  Then [employer] and genitals (private parts) with your normal soap and rinse thoroughly to remove soap.  After that Use CHG Soap as you would any other liquid soap. You can apply CHG directly to the skin and wash gently with a scrungie or a clean washcloth.   Apply the CHG Soap to your body ONLY FROM THE NECK DOWN.   Do not use on open wounds or open sores. Avoid contact with your eyes, ears, mouth and genitals (private parts). Wash Face and genitals (private parts)  with your normal soap.   Wash thoroughly, paying special attention to the area where your surgery will be performed.  Thoroughly rinse your body with warm water from the neck down.  DO NOT shower/wash with your normal soap after using and rinsing off the CHG Soap.  Pat yourself dry with a CLEAN TOWEL.  Wear CLEAN PAJAMAS to bed the night before surgery  Place CLEAN SHEETS on your bed the night before your surgery  DO NOT SLEEP WITH PETS.   Day of Surgery:  Take a shower with CHG soap. Wear Clean/Comfortable clothing the morning of surgery Do not apply any deodorants/lotions.   Remember to brush your teeth WITH YOUR REGULAR TOOTHPASTE.   Please read over the following fact sheets that you were given.

## 2021-03-09 NOTE — H&P (Signed)
[address]       [place] [address]             [phone]          CARDIOTHORACIC SURGERY HISTORY AND PHYSICAL EXAM  Referring Provider is Jens Som Madolyn Frieze, MD PCP is Heilingoetter, Johnette Abraham, PA-C  Chief Complaint  Patient presents with   Mitral Regurgitation      Surgical consult, TEE 01/16/21   HPI:   Patient is 65 year old male with history of mitral valve prolapse and mitral regurgitation, recurrent paroxysmal atrial fibrillation, and aneurysmal dilatation of the aortic root who has been referred for surgical consultation.   Patient states he has known of presence of a heart murmur for many years.  He states that he was told that he had rheumatic fever at age 30.  He has been followed for more than 10 years by Dr. Jens Som having originally presented with an episode of paroxysmal atrial fibrillation in the remote past.  He has been on long-term anticoagulation using Eliquis.  He has remained in sinus rhythm until last summer when he had another episode of paroxysmal atrial fibrillation that did not require cardioversion.  Transthoracic echocardiogram performed at that time revealed normal left ventricular systolic function with mitral valve prolapse and what was felt to be mild mitral regurgitation.   In early February of this year the patient developed COVID-19 pneumonia.  More than a week into his illness he suffered a syncopal episode at home for which she was hospitalized.  Work-up in the hospital included EKG, CTA of the chest, CT of the head and cervical spine, chest x-ray, orthostatic vital signs, carotid ultrasound, and blood work.  All of this turned out to be negative.   Transthoracic echocardiogram performed at that time revealed normal left ventricular systolic function with an obvious flail segment of the posterior leaflet of the mitral valve with at least moderate mitral regurgitation.  The patient was ultimately discharged home with his syncopal  episode attributed to possible vasovagal event.  Holter monitor was planned but apparently never completed.  The patient was seen in follow-up by Dr. Jens Som and transesophageal echocardiogram performed Jan 16, 2021 to further evaluate the severity of mitral regurgitation.  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  Left ventricular size and systolic function appeared normal.  There was moderate left atrial enlargement.  The patient was subsequently referred for elective surgical consultation.   Patient is married and lives locally in [place] with his wife who is a former patient of mine.  Patient continues to work but he does not exercise at all on a regular basis.  However, he reports no significant physical limitations and he still tends to vigorous chores around the house.Marland Kitchen  He specifically denies any symptoms of exertional shortness of breath.  He states that he does seem to fatigue more easily than he used to with strenuous activity.  He has not had any palpitations nor other symptoms to suggest a recurrence of atrial fibrillation.  Since hospital discharge last February he has not had any further symptoms of dizziness or syncope.  He specifically denies any history of resting shortness of breath, PND, orthopnea, or lower extremity edema.  He has never had any chest pain or chest tightness either with activity or at rest.  Patient is a 65 year old male with history of mitral valve  prolapse and mitral regurgitation, recurrent paroxysmal atrial fibrillation, and aneurysmal dilatation of the aortic root who has been seen in consultation recently for management of mitral valve prolapse with stage C severe asymptomatic primary mitral regurgitation.  The patient was seen in consultation on Feb 07, 2021 and since then underwent diagnostic cardiac catheterization and CT  angiography with tentative plans to proceed with elective mitral valve repair next week.  Catheterization performed by Dr. Excell Seltzer on Feb 10, 2021 revealed normal coronary artery anatomy with no significant coronary artery disease.  The patient had normal cardiac output with normal right-sided pressures.  CT angiography confirmed the presence of stable mild aneurysmal enlargement of the aortic root and revealed no contraindications to peripheral cannulation for surgery.   I spoke with the patient and his wife over the telephone today to discuss the results of his recent CT angiogram and make final plans for surgery.  He reports no new problems or complaints over the last few weeks and he is eagerly anticipating surgery scheduled for next week.  He specifically denies any symptoms of exertional shortness of breath or chest pain.  He has not had any fevers or cough.  He has not had any palpitations or other symptoms to suggest recurrence of atrial fibrillation.  Past Medical History:  Diagnosis Date   Atrial fibrillation (HCC)    Chronic insomnia    History of rheumatic fever    age 18   Mitral regurgitation    Patent foramen ovale    Thoracic aortic aneurysm [facility])     Past Surgical History:  Procedure Laterality Date   CARDIOVERSION     RIGHT/LEFT HEART CATH AND CORONARY ANGIOGRAPHY N/A 02/10/2021   Procedure: RIGHT/LEFT HEART CATH AND CORONARY ANGIOGRAPHY;  Surgeon: Tonny Bollman, MD;  Location: [facility] INVASIVE CV LAB;  Service: Cardiovascular;  Laterality: N/A;   TEE WITHOUT CARDIOVERSION N/A 01/16/2021   Procedure: TRANSESOPHAGEAL ECHOCARDIOGRAM (TEE);  Surgeon: Lewayne Bunting, MD;  Location: [facility] ENDOSCOPY;  Service: Cardiovascular;  Laterality: N/A;    Family History  Problem Relation Age of Onset   Pulmonary fibrosis Mother    Diverticulosis Father     Social History Social History   Tobacco Use   Smoking status: Never   Smokeless tobacco: Never  Vaping Use   Vaping Use: Never  used  Substance Use Topics   Alcohol use: No   Drug use: No    Prior to Admission medications   Medication Sig Start Date End Date Taking? Authorizing Provider  Cholecalciferol (VITAMIN D3) 250 MCG (10000 UT) capsule Take 10,000 Units by mouth in the morning.   Yes [provider]  Hydroxocobalamin Acetate 1000 MCG/ML SOLN Inject 1,000 mcg into the muscle 3 (three) times a week.   Yes [provider]  MAGNESIUM PO Take 12 mg by mouth daily.   Yes [provider]  metoprolol succinate (TOPROL-XL) 25 MG 24 hr tablet Take 1 tablet (25 mg total) by mouth daily. Patient taking differently: Take 25 mg by mouth in the morning. 07/27/20  Yes Lewayne Bunting, MD  Multiple Vitamin (MULTIVITAMIN WITH MINERALS) TABS tablet Take 1 tablet by mouth in the morning.   Yes [provider]  propranolol (INDERAL) 10 MG tablet Take 1 tablet (10 mg total) by mouth 4 (four) times daily as needed (atrial fib). 07/27/20  Yes Lewayne Bunting, MD  Zinc 50 MG TABS Take 50 mg by mouth daily.   Yes [provider]  ELIQUIS 5 MG TABS tablet  Take 1 tablet by mouth twice daily Patient taking differently: Take 5 mg by mouth 2 (two) times daily. 06/16/20   Lewayne Buntingrenshaw, Brian S, MD    Allergies  Allergen Reactions   Bee Venom Anaphylaxis   Antihistamines, Chlorpheniramine-Type Other (See Comments)    Acts as a depressant with pt.        Review of Systems:               General:                      normal appetite, decreased energy, no weight gain, no weight loss, no fever             Cardiac:                       no chest pain with exertion, no chest pain at rest, no SOB with exertion, no resting SOB, no PND, no orthopnea, no palpitations, no arrhythmia, no atrial fibrillation, no LE edema, no dizzy spells, 1 episode syncope             Respiratory:                 no shortness of breath, no home oxygen, no productive cough, no dry cough, no bronchitis, no wheezing, no  hemoptysis, no asthma, no pain with inspiration or cough, no sleep apnea, no CPAP at night             GI:                               no difficulty swallowing, no reflux, no frequent heartburn, no hiatal hernia, no abdominal pain, no constipation, no diarrhea, no hematochezia, no hematemesis, no melena             GU:                              no dysuria,  no frequency, no urinary tract infection, no hematuria, no enlarged prostate, no kidney stones, no kidney disease             Vascular:                     no pain suggestive of claudication, no pain in feet, no leg cramps, no varicose veins, no DVT, no non-healing foot ulcer             Neuro:                         no stroke, no TIA's, no seizures, no headaches, no temporary blindness one eye,  no slurred speech, no peripheral neuropathy, no chronic pain, no instability of gait, no memory/cognitive dysfunction             Musculoskeletal:         no arthritis, no joint swelling, no myalgias, no difficulty walking, normal mobility             Skin:                            no rash, no itching, no skin infections, no pressure sores or ulcerations             Psych:  no anxiety, no depression, no nervousness, no unusual recent stress             Eyes:                           no blurry vision, no floaters, no recent vision changes, + wears glasses or contacts             ENT:                            no hearing loss, no loose or painful teeth, no dentures, last saw dentist within the past year             Hematologic:               no easy bruising, no abnormal bleeding, no clotting disorder, no frequent epistaxis             Endocrine:                   no diabetes, does not check CBG's at home                            Physical Exam:               BP 118/71 (BP Location: Left Arm, Patient Position: Sitting)   Pulse 77   Resp 20   Ht  (1.88 m)   Wt 232 lb (105.2 kg)   SpO2 97% Comment: RA  BMI 29.79  kg/m              General:                      Mildly obese,  well-appearing             HEENT:                       Unremarkable             Neck:                           no JVD, no bruits, no adenopathy             Chest:                          clear to auscultation, symmetrical breath sounds, no wheezes, no rhonchi             CV:                              RRR, grade III/VI holosystolic murmur             Abdomen:                    soft, non-tender, no masses             Extremities:                 warm, well-perfused, pulses palpable, no LE edema             Rectal/GU  Deferred             Neuro:                         Grossly non-focal and symmetrical throughout             Skin:                            Clean and dry, no rashes, no breakdown     Diagnostic Tests:     ECHOCARDIOGRAM REPORT         Patient Name:   TANER RZEPKA Date of Exam: 10/27/2020  Medical Rec #:  [id number]     Height:       74.0 in  Accession #:    [id number]    Weight:       230.0 lb  Date of Birth:  01-Aug-1956     BSA:          2.306 m  Patient Age:    70 years      BP:           127/75 mmHg  Patient Gender: M             HR:           79 bpm.  Exam Location:  Inpatient   Procedure: 2D Echo, Cardiac Doppler and Color Doppler                         REPORT CONTAINS CRITICAL RESULT     Findings communicated to inpatient attending physician Dr. Miquel Dunn at 4:02  PM.   Indications:    Syncope 780.2 / R55     History:        Patient has no prior history of Echocardiogram  examinations.                  Arrythmias:Atrial Fibrillation; Signs/Symptoms:Syncope.     Sonographer:    Eulah Pont RDCS  Referring Phys: [id number] SARA L NEAL   IMPRESSIONS     1. Left ventricular ejection fraction, by estimation, is 60 to 65%. The  left ventricle has normal function. The left ventricle has no regional  wall motion abnormalities. Left ventricular diastolic parameters are   indeterminate.   2. Right ventricular systolic function is normal. The right ventricular  size is normal. There is normal pulmonary artery systolic pressure.   3. Left atrial size was moderately dilated.   4. Flail posterior leaflet of mitral valve, see images 57, 81. The mitral  valve is myxomatous. Moderate mitral valve regurgitation. No evidence of  mitral stenosis. There is severe holosystolic prolapse of posterior  leaflet of the mitral valve.   5. The aortic valve is tricuspid. Aortic valve regurgitation is not  visualized. No aortic stenosis is present.   6. Aortic root/ascending aorta has been repaired/replaced. There is mild  dilatation of the ascending aorta, measuring 38 mm.   Comparison(s): No prior Echocardiogram.   Conclusion(s)/Recommendation(s): Eccentric MR with flail posterior leaflet  of mitral valve. Given angles cannot fully exclude severe MR. Recommend  TEE for further evaluation. As patient is currently Covid positive, would  consider TEE once recovered from  Covid.   FINDINGS   Left Ventricle: Left ventricular ejection fraction, by estimation, is 60  to 65%. The left ventricle has normal function. The left ventricle has no  regional  wall motion abnormalities. The left ventricular internal cavity  size was normal in size. There is   no left ventricular hypertrophy. Left ventricular diastolic parameters  are indeterminate.   Right Ventricle: The right ventricular size is normal. No increase in  right ventricular wall thickness. Right ventricular systolic function is  normal. There is normal pulmonary artery systolic pressure. The tricuspid  regurgitant velocity is 2.15 m/s, and   with an assumed right atrial pressure of 8 mmHg, the estimated right  ventricular systolic pressure is 26.5 mmHg.   Left Atrium: Left atrial size was moderately dilated.   Right Atrium: Right atrial size was normal in size.   Pericardium: There is no evidence of pericardial  effusion. Presence of  pericardial fat pad.   Mitral Valve: Flail posterior leaflet of mitral valve, see images 57, 81.  The mitral valve is myxomatous. There is severe holosystolic prolapse of  posterior leaflet of the mitral valve. Moderate mitral valve  regurgitation, with anteriorly-directed jet.  No evidence of mitral valve stenosis.   Tricuspid Valve: The tricuspid valve is normal in structure. Tricuspid  valve regurgitation is trivial. No evidence of tricuspid stenosis.   Aortic Valve: The aortic valve is tricuspid. Aortic valve regurgitation is  not visualized. No aortic stenosis is present.   Pulmonic Valve: The pulmonic valve was grossly normal. Pulmonic valve  regurgitation is trivial. No evidence of pulmonic stenosis.   Aorta: The aortic root/ascending aorta has been repaired/replaced. There  is mild dilatation of the ascending aorta, measuring 38 mm.   Venous: The inferior vena cava was not well visualized.   IAS/Shunts: The atrial septum is grossly normal.      LEFT VENTRICLE  PLAX 2D  LVIDd:         5.30 cm  Diastology  LVIDs:         3.30 cm  LV e' medial:    6.96 cm/s  LV PW:         0.80 cm  LV E/e' medial:  15.8  LV IVS:        0.80 cm  LV e' lateral:   6.31 cm/s  LVOT diam:     2.40 cm  LV E/e' lateral: 17.4  LV SV:         105  LV SV Index:   46  LVOT Area:     4.52 cm      RIGHT VENTRICLE  RV S prime:     15.30 cm/s  TAPSE (M-mode): 2.7 cm   LEFT ATRIUM             Index       RIGHT ATRIUM           Index  LA diam:        3.70 cm 1.60 cm/m  RA Area:     15.30 cm  LA Vol (A2C):   74.1 ml 32.13 ml/m RA Volume:   35.10 ml  15.22 ml/m  LA Vol (A4C):   71.3 ml 30.91 ml/m  LA Biplane Vol: 74.6 ml 32.34 ml/m   AORTIC VALVE  LVOT Vmax:   114.00 cm/s  LVOT Vmean:  75.600 cm/s  LVOT VTI:    0.233 m     AORTA  Ao Root diam: 4.30 cm  Ao Asc diam:  3.80 cm   MITRAL VALVE                TRICUSPID VALVE  MV Area (PHT): 3.85 cm  TR Peak grad:    18.5 mmHg  MV Decel Time: 197 msec     TR Vmax:        215.00 cm/s  MV E velocity: 110.00 cm/s  MV A velocity: 96.30 cm/s   SHUNTS  MV E/A ratio:  1.14         Systemic VTI:  0.23 m                              Systemic Diam: 2.40 cm   Jodelle Red MD  Electronically signed by Jodelle Red MD  Signature Date/Time: 10/27/2020/4:02:36 PM       TRANSESOPHOGEAL ECHO REPORT         Patient Name:   Lorenda Peck Date of Exam: 01/16/2021  Medical Rec #:  [id number]     Height:       74.0 in  Accession #:    [id number]    Weight:       230.0 lb  Date of Birth:  04/25/1956     BSA:          2.306 m  Patient Age:    64 years      BP:           111/66 mmHg  Patient Gender: M             HR:           85 bpm.  Exam Location:  Inpatient   Procedure: Transesophageal Echo, Color Doppler, Cardiac Doppler and 3D  Echo   Indications:     Mitral Valve Disorder     History:         Patient has prior history of Echocardiogram examinations,  most                   recent 10/27/2020. Arrythmias:Atrial Fibrillation.     Sonographer:     Thurman Coyer RDCS (AE)  Referring Phys:  [id number] BRIAN S CRENSHAW  Diagnosing Phys: Olga Millers MD   PROCEDURE: After discussion of the risks and benefits of a TEE, an  informed consent was obtained from the patient. The transesophogeal probe  was passed without difficulty through the esophogus of the patient.  Sedation performed by different physician.  The patient was monitored while under deep sedation. Anesthestetic  sedation was provided intravenously by Anesthesiology: 308.82mg  of  Propofol, 60mg  of Lidocaine. The patient developed no complications during  the procedure.   IMPRESSIONS     1. Flail P2 segement of posterior MV leaflet with severe eccentric MR.   2. Left ventricular ejection fraction, by estimation, is 60 to 65%. The  left ventricle has normal function.   3. Right ventricular systolic function is normal. The right  ventricular  size is normal.   4. Left atrial size was moderately dilated. No left atrial/left atrial  appendage thrombus was detected.   5. The mitral valve is abnormal. Severe mitral valve regurgitation. There  is severe holosystolic prolapse of the middle scallop of the posterior  leaflet of the mitral valve.   6. The aortic valve is tricuspid. Aortic valve regurgitation is not  visualized.   7. Aortic dilatation noted. There is mild dilatation of the aortic root,  measuring 44 mm. There is mild dilatation of the ascending aorta,  measuring 42 mm.   FINDINGS   Left Ventricle: Left ventricular ejection fraction, by estimation, is 60  to 65%. The left  ventricle has normal function. The left ventricular  internal cavity size was normal in size.   Right Ventricle: The right ventricular size is normal. Right ventricular  systolic function is normal.   Left Atrium: Left atrial size was moderately dilated. No left atrial/left  atrial appendage thrombus was detected.   Right Atrium: Right atrial size was normal in size.   Pericardium: There is no evidence of pericardial effusion.   Mitral Valve: The mitral valve is abnormal. There is severe holosystolic  prolapse of the middle scallop of the posterior leaflet of the mitral  valve. Severe mitral valve regurgitation.   Tricuspid Valve: The tricuspid valve is normal in structure. Tricuspid  valve regurgitation is mild.   Aortic Valve: The aortic valve is tricuspid. Aortic valve regurgitation is  not visualized.   Pulmonic Valve: The pulmonic valve was normal in structure. Pulmonic valve  regurgitation is trivial.   Aorta: Aortic dilatation noted. There is mild dilatation of the aortic  root, measuring 44 mm. There is mild dilatation of the ascending aorta,  measuring 42 mm.   IAS/Shunts: The interatrial septum is aneurysmal. No atrial level shunt  detected by color flow Doppler.   Additional Comments: Flail P2 segement of  posterior MV leaflet with severe  eccentric MR.         AORTA  Ao Root diam: 4.40 cm   Olga Millers MD  Electronically signed by Olga Millers MD  Signature Date/Time: 01/16/2021/2:33:04 PM         RIGHT/LEFT HEART CATH AND CORONARY ANGIOGRAPHY    Conclusion  1. Widely patent coronary arteries with no obstructive CAD 2. Normal intracardiac hemodynamics with low diastolic filling pressures    Indications  Nonrheumatic mitral valve regurgitation [I34.0 (ICD-10-CM)]    Procedural Details  Technical Details INDICATION: mitral regurgitation, preoperative study  PROCEDURAL DETAILS: There was an indwelling IV in a right antecubital vein. Using normal sterile technique, the IV was changed out for a 5 Fr brachial sheath over a 0.018 inch wire. The right wrist was then prepped, draped, and anesthetized with 1% lidocaine. Using the modified Seldinger technique a 5/6 French Slender sheath was placed in the right radial artery. Intra-arterial verapamil was administered through the radial artery sheath. IV heparin was administered after a JR4 catheter was advanced into the central aorta. A Swan-Ganz catheter was used for the right heart catheterization. Standard protocol was followed for recording of right heart pressures and sampling of oxygen saturations. Fick cardiac output was calculated. Standard Judkins catheters were used for selective coronary angiography. LV pressure is recorded and an aortic valve pullback is performed. There were no immediate procedural complications. The patient was transferred to the post catheterization recovery area for further monitoring.    Estimated blood loss <50 mL.   During this procedure medications were administered to achieve and maintain moderate conscious sedation while the patient's heart rate, blood pressure, and oxygen saturation were continuously monitored and I was present face-to-face 100% of this time.    Medications (Filter:  Administrations occurring from 1222 to 1319 on 02/10/21)  important  Continuous medications are totaled by the amount administered until 02/10/21 1319.    Heparin (Porcine) in NaCl 1000-0.9 UT/500ML-% SOLN (mL) Total volume:  1,000 mL  Date/Time Rate/Dose/Volume Action   02/10/21 1224 500 mL Given   1224 500 mL Given    midazolam (VERSED) injection (mg) Total dose:  1 mg  Date/Time Rate/Dose/Volume Action   02/10/21 1228 1 mg Given    fentaNYL (SUBLIMAZE)  injection (mcg) Total dose:  25 mcg  Date/Time Rate/Dose/Volume Action   02/10/21 1228 25 mcg Given    lidocaine (PF) (XYLOCAINE) 1 % injection (mL) Total volume:  7 mL  Date/Time Rate/Dose/Volume Action   02/10/21 1244 2 mL Given   1246 5 mL Given    heparin sodium (porcine) injection (Units) Total dose:  5,000 Units  Date/Time Rate/Dose/Volume Action   02/10/21 1251 5,000 Units Given    Radial Cocktail/Verapamil only (mL) Total volume:  10 mL  Date/Time Rate/Dose/Volume Action   02/10/21 1247 10 mL Given    iohexol (OMNIPAQUE) 350 MG/ML injection (mL) Total volume:  45 mL  Date/Time Rate/Dose/Volume Action   02/10/21 1315 45 mL Given     Sedation Time  Sedation Time Physician-1: 35 minutes 34 seconds    Radiation/Fluoro  Fluoro time: 4.1 (min) DAP: 16730 (mGycm2) Cumulative Air Kerma: 292 (mGy)   Coronary Findings   Diagnostic Dominance: Right  Left Main  Vessel is angiographically normal.  Left Circumflex  Vessel is angiographically normal.  Right Coronary Artery  The vessel exhibits minimal luminal irregularities.   Intervention   No interventions have been documented.          Coronary Diagrams   Diagnostic Dominance: Right    Intervention     Implants     No implant documentation for this case.    Syngo Images   Show images for CARDIAC CATHETERIZATION  Images on Long Term Storage   Show images for Vi, Whitesel to Procedure Log  Procedure  Log      Hemo Data  Flowsheet Row Most Recent Value  Fick Cardiac Output 6.47 L/min  Fick Cardiac Output Index 2.83 (L/min)/BSA  RA A Wave 5 mmHg  RA V Wave 3 mmHg  RA Mean 2 mmHg  RV Systolic Pressure 21 mmHg  RV Diastolic Pressure 3 mmHg  RV EDP 6 mmHg  PA Systolic Pressure 16 mmHg  PA Diastolic Pressure 6 mmHg  PA Mean 12 mmHg  PW A Wave 10 mmHg  PW V Wave 8 mmHg  PW Mean 7 mmHg  AO Systolic Pressure 94 mmHg  AO Diastolic Pressure 64 mmHg  AO Mean 78 mmHg  LV Systolic Pressure 92 mmHg  LV Diastolic Pressure 3 mmHg  LV EDP 8 mmHg  AOp Systolic Pressure 97 mmHg  AOp Diastolic Pressure 66 mmHg  AOp Mean Pressure 80 mmHg  LVp Systolic Pressure 96 mmHg  LVp Diastolic Pressure 5 mmHg  LVp EDP Pressure 8 mmHg  QP/QS 1  TPVR Index 4.24 HRUI  TSVR Index 27.54 HRUI  PVR SVR Ratio 0.07  TPVR/TSVR Ratio 0.15      Cardiac/Coronary  CT   TECHNIQUE: The patient was scanned on a [employer] scanner.   PROTOCOL: A 120 kV prospective scan was triggered in the descending thoracic aorta at 111 HU's. Axial non-contrast 3 mm slices were carried out through the heart. The data set was analyzed on a dedicated work station and scored using the Agatston method. Gantry rotation speed was 250 msecs and collimation was .6 mm. Beta blockade and 0.8 mg of sl NTG was given. The 3D data set was reconstructed in 5% intervals of the 35-75 % of the R-R cycle. Systolic and diastolic phases were analyzed on a dedicated work station using MPR, MIP and VRT modes. The patient received OMNIPAQUE IOHEXOL 350 MG/ML SOLN contrast.   FINDINGS: Image quality: Good.   Noise artifact is: Limited.   Coronary  calcium score is 0.   Coronary arteries: Normal coronary origins. Right dominance. Nitroglycerin was not administered per protocol, and therefore this study cannot assess coronary luminal stenosis.   Aorta:   Measurements made in double oblique technique in diastole 75% R-R:    Sinus of Valsalva:   R-L: 49 mm   L-Non: 50 mm   R-Non: 49 mm   ST junction: 39 mm   Mid ascending aorta (at PA bifurcation): 37 mm   Aortic Valve: No calcifications. Tricuspid aortic valve with normal leaflet excursion.   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   Other findings:   Normal pulmonary vein drainage into the left atrium.   Normal left atrial appendage without thrombus.   Normal size of the main pulmonary artery.   Probable PFO with left to right shunt.   Calculated LVEF 62%.   IMPRESSION: 1. Severe aneurysmal dilation of the ascending thoracic aorta at level of sinus of Valsalva, 50 mm from left to non-coronary cusp.   2. Myxomatous mitral valve with prolapse of posterior mitral valve leaflet, with severe P2 prolapse and flail segment at P2, and prolapse of P3 scallop.   3. Normal coronary origin with right dominance. Coronary calcium score is 0.   4. Probable patent foramen ovale with left to right shunt.     Electronically Signed   By: Weston Brass   On: 02/22/2021 16:49       CT ANGIOGRAPHY CHEST, ABDOMEN AND PELVIS   TECHNIQUE: Non-contrast CT of the chest was initially obtained.   Multidetector CT imaging through the chest, abdomen and pelvis was performed using the standard protocol during bolus administration of intravenous contrast. Multiplanar reconstructed images and MIPs were obtained and reviewed to evaluate the vascular anatomy.   CONTRAST:  OMNIPAQUE IOHEXOL 350 MG/ML SOLN   COMPARISON:  MRA 12/10/2020   FINDINGS: CTA CHEST FINDINGS   Cardiovascular: Aneurysmal dilatation of the aortic arch measuring up to 4.9 cm compared with 4.8 cm on prior MRA. Ascending thoracic aorta 3.7 cm. Distal aortic arch 3.3 cm. Proximal descending thoracic aorta 3.0 cm. No dissection.  Heart is upper limits normal in size. Scattered calcifications in the distal aortic arch.   Mediastinum/Nodes: No mediastinal, hilar, or axillary adenopathy. Trachea and esophagus are unremarkable. Thyroid unremarkable.   Lungs/Pleura: Lungs are clear. No focal airspace opacities or suspicious nodules. No effusions.   Musculoskeletal: Chest wall soft tissues are unremarkable. No acute bony abnormality.   Review of the MIP images confirms the above findings.   CTA ABDOMEN AND PELVIS FINDINGS   VASCULAR   Aorta: No aneurysm or dissection. Proximal abdominal aorta diameter 2.5 cm. Mid abdominal aorta at the level of the renal arteries 2.0 cm. Distal abdominal aorta diameter 1.6 cm.   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 Mild ectasia of the vessel at this level measuring 1 cm. Vessels patent.   SMA: Patent without evidence of aneurysm, dissection, vasculitis or significant stenosis.   Renals: Both renal arteries are patent without evidence of aneurysm, dissection, vasculitis, fibromuscular dysplasia or significant stenosis.   IMA: Patent without evidence of aneurysm, dissection, vasculitis or significant stenosis.   Inflow: Scattered calcifications.  No aneurysm or dissection.   Veins: No obvious venous abnormality within the limitations of this arterial phase study.   Review of the MIP images confirms  the above findings.   NON-VASCULAR   Hepatobiliary: No focal hepatic abnormality. Gallbladder unremarkable.   Pancreas: No focal abnormality or ductal dilatation.   Spleen: No focal abnormality.  Normal size.   Adrenals/Urinary Tract: No adrenal abnormality. Left lower pole parapelvic cyst. Otherwise no focal renal abnormality. No stones or hydronephrosis. Urinary bladder is unremarkable.   Stomach/Bowel: Diffuse colonic diverticulosis. No active diverticulitis. Appendix is normal. Stomach and small bowel decompressed, unremarkable.    Lymphatic: No adenopathy   Reproductive: Prostate enlargement.   Other: No free fluid or free air.   Musculoskeletal: No acute bony abnormality.   Review of the MIP images confirms the above findings.   IMPRESSION: Aneurysmal dilatation of the aortic root measuring 4.9 cm, not significantly changed since prior MRA. Otherwise, aorta normal caliber. No aortic dissection.   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 Slight ectasia of the vessel at this level, 1 cm.   No acute cardiopulmonary disease.   Diffuse colonic diverticulosis.  No active diverticulitis.     Electronically Signed   By: Charlett Nose M.D.   On: 02/22/2021 10:26         Impression:   Patient has mitral valve prolapse with stage C severe asymptomatic primary mitral regurgitation.  He also has a history of recurrent paroxysmal atrial fibrillation although for the most part he remains in sinus rhythm since a single episode of atrial fibrillation last July.   I have personally reviewed the patient's recent transthoracic and transesophageal echocardiograms, catheterization and CT angiograms.  He has myxomatous degenerative disease of the mitral valve with an obvious flail segment involving a portion of the middle scallop (P2) of the posterior leaflet.  There appears to be severe mitral regurgitation with an eccentric jet that courses around the anterior aspect of the left atrium.  There is moderate left atrial enlargement.  Left ventricular size and systolic function appears normal.  Catheterization revealed normal coronary artery anatomy with no significant coronary artery disease.  Right heart pressures were normal.  CT angiography reveals no contraindications to peripheral cannulation for surgery.  The aortic root is somewhat dilated but tapers to 39 mm at the sinotubular junction and the aortic valve appears normal.  Previous measurements of the aortic root have  remained stable for the past 10 years.  I do not feel that aortic root replacement with or without valve sparing techniques is indicated at this time.   Options include continued close observation on medical therapy versus elective mitral valve repair possibly with concomitant Maze procedure.  Based upon review of the patient's transesophageal echocardiogram I feel there is very high likelihood of durable mitral valve repair and risks associated with surgery should be relatively low.       Plan:   The patient and his wife were again counseled regarding his diagnosis of severe primary mitral regurgitation.  We reviewed the results of his recent diagnostic tests including the most recent transesophageal echocardiogram, catheterization and CT angiogram.  We previously discussed the natural history of mitral regurgitation as well as alternative treatment strategies and the impact of his age, current state of health, and any significant comorbid medical problems on clinical decision making.  The rationale for elective surgery has been explained, including a comparison between surgery and continued medical therapy with close follow-up.  The likelihood of successful and durable mitral valve repair has been discussed with particular reference to the findings of the most recent echocardiogram.  Based upon these findings and previous experience, I have quoted a greater than 98 percent likelihood of successful valve repair with less than 1 percent risk of mortality or major morbidity.  The relative risks and benefits of performing a maze procedure at the time of their surgery was discussed at length, including the expected likelihood of long term freedom from recurrence of atrial fibrillation.  Alternative surgical approaches have been discussed including a comparison between conventional sternotomy and minimally-invasive techniques.  The relative risks and benefits of each have been reviewed as they pertain to the  patient's specific circumstances, and expectations for the patient's postoperative convalescence has been discussed.  We plan to proceed with surgery on Monday, March 13, 2021.  The patient has stopped taking Eliquis in anticipation of surgery.   The patient understands and accepts all potential risks of surgery including but not limited to risk of death, stroke or other neurologic complication, myocardial infarction, congestive heart failure, respiratory failure, renal failure, bleeding requiring transfusion and/or reexploration, arrhythmia, heart block or bradycardia requiring permanent pacemaker insertion, infection or other wound complications, pneumonia, pleural and/or pericardial effusion, pulmonary embolus, aortic dissection or other major vascular complication, or other immediate or delayed complications related to valve repair or replacement including but not limited to recurrent or persistent mitral regurgitation and/or mitral stenosis, LV outflow tract obstruction, aortic insufficiency, paravalvular leak, posterior AV groove disruption, structural valve deterioration and failure, thrombosis, embolization, or endocarditis.  Specific risks potentially related to the minimally-invasive approach were discussed at length, including but not limited to risk of conversion to full or partial sternotomy, aortic dissection or other major vascular complication, unilateral acute lung injury or pulmonary edema, phrenic nerve dysfunction or paralysis, rib fracture, chronic pain, lung hernia, or lymphocele. All of his questions have been answered.               Salvatore Decent. Cornelius Moras, MD 03/08/2021 9:40 AM

## 2021-03-10 ENCOUNTER — Encounter (HOSPITAL_COMMUNITY): Payer: Self-pay | Admitting: Thoracic Surgery (Cardiothoracic Vascular Surgery)

## 2021-03-10 ENCOUNTER — Encounter (HOSPITAL_COMMUNITY): Payer: Self-pay

## 2021-03-10 ENCOUNTER — Ambulatory Visit (HOSPITAL_COMMUNITY)
Admission: RE | Admit: 2021-03-10 | Discharge: 2021-03-10 | Disposition: A | Discharge status: Home / Self Care | Payer: Medicare Other | Source: Ambulatory Visit | Attending: Thoracic Surgery (Cardiothoracic Vascular Surgery) | Admitting: Thoracic Surgery (Cardiothoracic Vascular Surgery)

## 2021-03-10 ENCOUNTER — Other Ambulatory Visit: Payer: Self-pay

## 2021-03-10 ENCOUNTER — Encounter (HOSPITAL_COMMUNITY)
Admission: RE | Admit: 2021-03-10 | Discharge: 2021-03-10 | Disposition: A | Discharge status: Home / Self Care | Payer: Medicare Other | Source: Ambulatory Visit | Attending: Thoracic Surgery (Cardiothoracic Vascular Surgery) | Admitting: Thoracic Surgery (Cardiothoracic Vascular Surgery)

## 2021-03-10 ENCOUNTER — Ambulatory Visit (HOSPITAL_BASED_OUTPATIENT_CLINIC_OR_DEPARTMENT_OTHER)
Admission: RE | Admit: 2021-03-10 | Discharge: 2021-03-10 | Disposition: A | Discharge status: Home / Self Care | Payer: Medicare Other | Source: Ambulatory Visit | Attending: Thoracic Surgery (Cardiothoracic Vascular Surgery) | Admitting: Thoracic Surgery (Cardiothoracic Vascular Surgery)

## 2021-03-10 ENCOUNTER — Other Ambulatory Visit (HOSPITAL_COMMUNITY): Payer: Medicare Other

## 2021-03-10 DIAGNOSIS — R21 Rash and other nonspecific skin eruption: Secondary | ICD-10-CM | POA: Diagnosis not present

## 2021-03-10 DIAGNOSIS — J9811 Atelectasis: Secondary | ICD-10-CM | POA: Diagnosis not present

## 2021-03-10 DIAGNOSIS — I480 Paroxysmal atrial fibrillation: Secondary | ICD-10-CM

## 2021-03-10 DIAGNOSIS — Z01818 Encounter for other preprocedural examination: Secondary | ICD-10-CM

## 2021-03-10 DIAGNOSIS — I350 Nonrheumatic aortic (valve) stenosis: Secondary | ICD-10-CM | POA: Diagnosis not present

## 2021-03-10 DIAGNOSIS — I340 Nonrheumatic mitral (valve) insufficiency: Secondary | ICD-10-CM

## 2021-03-10 DIAGNOSIS — I712 Thoracic aortic aneurysm, without rupture: Secondary | ICD-10-CM | POA: Diagnosis not present

## 2021-03-10 DIAGNOSIS — Z7901 Long term (current) use of anticoagulants: Secondary | ICD-10-CM | POA: Diagnosis not present

## 2021-03-10 DIAGNOSIS — J811 Chronic pulmonary edema: Secondary | ICD-10-CM | POA: Diagnosis not present

## 2021-03-10 DIAGNOSIS — J90 Pleural effusion, not elsewhere classified: Secondary | ICD-10-CM | POA: Diagnosis not present

## 2021-03-10 DIAGNOSIS — I371 Nonrheumatic pulmonary valve insufficiency: Secondary | ICD-10-CM | POA: Diagnosis not present

## 2021-03-10 DIAGNOSIS — Z9889 Other specified postprocedural states: Secondary | ICD-10-CM | POA: Diagnosis not present

## 2021-03-10 DIAGNOSIS — Z8616 Personal history of COVID-19: Secondary | ICD-10-CM | POA: Diagnosis not present

## 2021-03-10 DIAGNOSIS — I517 Cardiomegaly: Secondary | ICD-10-CM | POA: Diagnosis not present

## 2021-03-10 DIAGNOSIS — N400 Enlarged prostate without lower urinary tract symptoms: Secondary | ICD-10-CM | POA: Diagnosis present

## 2021-03-10 DIAGNOSIS — D696 Thrombocytopenia, unspecified: Secondary | ICD-10-CM | POA: Diagnosis not present

## 2021-03-10 DIAGNOSIS — I511 Rupture of chordae tendineae, not elsewhere classified: Secondary | ICD-10-CM | POA: Diagnosis not present

## 2021-03-10 DIAGNOSIS — Z8701 Personal history of pneumonia (recurrent): Secondary | ICD-10-CM | POA: Diagnosis not present

## 2021-03-10 DIAGNOSIS — Z20822 Contact with and (suspected) exposure to covid-19: Secondary | ICD-10-CM | POA: Insufficient documentation

## 2021-03-10 DIAGNOSIS — I341 Nonrheumatic mitral (valve) prolapse: Secondary | ICD-10-CM | POA: Diagnosis not present

## 2021-03-10 DIAGNOSIS — I700 Atherosclerosis of aorta: Secondary | ICD-10-CM | POA: Insufficient documentation

## 2021-03-10 HISTORY — DX: Cardiac arrhythmia, unspecified: I49.9

## 2021-03-10 HISTORY — DX: Cardiac murmur, unspecified: R01.1

## 2021-03-10 LAB — URINALYSIS, ROUTINE W REFLEX MICROSCOPIC
Bilirubin Urine: NEGATIVE
Glucose, UA: NEGATIVE mg/dL
Hgb urine dipstick: NEGATIVE
Ketones, ur: NEGATIVE mg/dL
Leukocytes,Ua: NEGATIVE
Nitrite: NEGATIVE
Protein, ur: NEGATIVE mg/dL
Specific Gravity, Urine: 1.019 (ref 1.005–1.030)
pH: 7 (ref 5.0–8.0)

## 2021-03-10 LAB — APTT: aPTT: 29 seconds (ref 24–36)

## 2021-03-10 LAB — BLOOD GAS, ARTERIAL
Acid-base deficit: 1.3 mmol/L (ref 0.0–2.0)
Bicarbonate: 22.4 mmol/L (ref 20.0–28.0)
FIO2: 21
O2 Saturation: 97.1 %
Patient temperature: 37
pCO2 arterial: 34.4 mmHg (ref 32.0–48.0)
pH, Arterial: 7.43 (ref 7.350–7.450)
pO2, Arterial: 92.4 mmHg (ref 83.0–108.0)

## 2021-03-10 LAB — COMPREHENSIVE METABOLIC PANEL
ALT: 16 U/L (ref 0–44)
AST: 35 U/L (ref 15–41)
Albumin: 3.7 g/dL (ref 3.5–5.0)
Alkaline Phosphatase: 72 U/L (ref 38–126)
Anion gap: 9 (ref 5–15)
BUN: 20 mg/dL (ref 8–23)
CO2: 20 mmol/L — ABNORMAL LOW (ref 22–32)
Calcium: 9.2 mg/dL (ref 8.9–10.3)
Chloride: 107 mmol/L (ref 98–111)
Creatinine, Ser: 1.04 mg/dL (ref 0.61–1.24)
GFR, Estimated: >60 mL/min (ref >60)
Glucose, Bld: 97 mg/dL (ref 70–99)
Potassium: 5 mmol/L (ref 3.5–5.1)
Sodium: 136 mmol/L (ref 135–145)
Total Bilirubin: 0.9 mg/dL (ref 0.3–1.2)
Total Protein: 6.4 g/dL — ABNORMAL LOW (ref 6.5–8.1)

## 2021-03-10 LAB — CBC
HCT: 46.5 % (ref 39.0–52.0)
Hemoglobin: 15.7 g/dL (ref 13.0–17.0)
MCH: 31.2 pg (ref 26.0–34.0)
MCHC: 33.8 g/dL (ref 30.0–36.0)
MCV: 92.4 fL (ref 80.0–100.0)
Platelets: 204 10*3/uL (ref 150–400)
RBC: 5.03 MIL/uL (ref 4.22–5.81)
RDW: 13.7 % (ref 11.5–15.5)
WBC: 6 10*3/uL (ref 4.0–10.5)
nRBC: 0 % (ref 0.0–0.2)

## 2021-03-10 LAB — SURGICAL PCR SCREEN
MRSA, PCR: NEGATIVE
Staphylococcus aureus: NEGATIVE

## 2021-03-10 LAB — PROTIME-INR
INR: 1.1 (ref 0.8–1.2)
Prothrombin Time: 13.8 seconds (ref 11.4–15.2)

## 2021-03-10 LAB — HEMOGLOBIN A1C
Hgb A1c MFr Bld: 6 % — ABNORMAL HIGH (ref 4.8–5.6)
Mean Plasma Glucose: 125.5 mg/dL

## 2021-03-10 LAB — SARS CORONAVIRUS 2 (TAT 6-24 HRS): SARS Coronavirus 2: NEGATIVE

## 2021-03-10 MED ORDER — VANCOMYCIN HCL 1500 MG/300ML IV SOLN
1500.0000 mg | INTRAVENOUS | Status: AC
  Administered 2021-03-13: 1500 mg via INTRAVENOUS
  Filled 2021-03-10: qty 300

## 2021-03-10 MED ORDER — TRANEXAMIC ACID 1000 MG/10ML IV SOLN
1.5000 mg/kg/h | INTRAVENOUS | Status: AC
  Administered 2021-03-13: 1.5 mg/kg/h via INTRAVENOUS
  Filled 2021-03-10: qty 25

## 2021-03-10 MED ORDER — CEFAZOLIN SODIUM-DEXTROSE 2-4 GM/100ML-% IV SOLN
2.0000 g | INTRAVENOUS | Status: AC
  Administered 2021-03-13: 2 g via INTRAVENOUS
  Filled 2021-03-10: qty 100

## 2021-03-10 MED ORDER — TRANEXAMIC ACID (OHS) BOLUS VIA INFUSION: 15.0000 mg/kg | INTRAVENOUS | Status: DC

## 2021-03-10 MED ORDER — INSULIN REGULAR(HUMAN) IN NACL 100-0.9 UT/100ML-% IV SOLN
INTRAVENOUS | Status: AC
  Administered 2021-03-13: 3 [IU]/h via INTRAVENOUS
  Filled 2021-03-10: qty 100

## 2021-03-10 MED ORDER — VANCOMYCIN HCL 1000 MG IV SOLR
INTRAVENOUS | Status: DC
  Filled 2021-03-10: qty 1000

## 2021-03-10 MED ORDER — DEXMEDETOMIDINE HCL IN NACL 400 MCG/100ML IV SOLN: 0.1000 ug/kg/h | INTRAVENOUS | Status: DC

## 2021-03-10 MED ORDER — TRANEXAMIC ACID 1000 MG/10ML IV SOLN: 1.5000 mg/kg/h | INTRAVENOUS | Status: DC

## 2021-03-10 MED ORDER — MILRINONE LACTATE IN DEXTROSE 20-5 MG/100ML-% IV SOLN
0.3000 ug/kg/min | INTRAVENOUS | Status: DC
  Filled 2021-03-10: qty 100

## 2021-03-10 MED ORDER — GLUTARALDEHYDE 0.625% SOAKING SOLUTION
TOPICAL | Status: DC
  Filled 2021-03-10: qty 50

## 2021-03-10 MED ORDER — TRANEXAMIC ACID (OHS) PUMP PRIME SOLUTION
2.0000 mg/kg | INTRAVENOUS | Status: DC
  Filled 2021-03-10: qty 2.15

## 2021-03-10 MED ORDER — EPINEPHRINE HCL 5 MG/250ML IV SOLN IN NS
0.0000 ug/min | INTRAVENOUS | Status: DC
  Filled 2021-03-10: qty 250

## 2021-03-10 MED ORDER — TRANEXAMIC ACID (OHS) BOLUS VIA INFUSION
15.0000 mg/kg | INTRAVENOUS | Status: AC
  Administered 2021-03-13: 1611 mg via INTRAVENOUS
  Filled 2021-03-10: qty 1611

## 2021-03-10 MED ORDER — NITROGLYCERIN IN D5W 200-5 MCG/ML-% IV SOLN
2.0000 ug/min | INTRAVENOUS | Status: DC
  Filled 2021-03-10: qty 250

## 2021-03-10 MED ORDER — DEXMEDETOMIDINE HCL IN NACL 400 MCG/100ML IV SOLN
0.1000 ug/kg/h | INTRAVENOUS | Status: AC
  Administered 2021-03-13: .7 ug/kg/h via INTRAVENOUS
  Filled 2021-03-10: qty 100

## 2021-03-10 MED ORDER — TRANEXAMIC ACID (OHS) PUMP PRIME SOLUTION: 2.0000 mg/kg | INTRAVENOUS | Status: DC

## 2021-03-10 MED ORDER — MILRINONE LACTATE IN DEXTROSE 20-5 MG/100ML-% IV SOLN: 0.3000 ug/kg/min | INTRAVENOUS | Status: DC

## 2021-03-10 MED ORDER — MANNITOL 20 % IV SOLN
INTRAVENOUS | Status: DC
  Filled 2021-03-10 (×2): qty 13

## 2021-03-10 MED ORDER — POTASSIUM CHLORIDE 2 MEQ/ML IV SOLN
80.0000 meq | INTRAVENOUS | Status: DC
  Filled 2021-03-10: qty 40

## 2021-03-10 MED ORDER — NOREPINEPHRINE 4 MG/250ML-% IV SOLN
0.0000 ug/min | INTRAVENOUS | Status: DC
  Filled 2021-03-10: qty 250

## 2021-03-10 MED ORDER — SODIUM CHLORIDE 0.9 % IV SOLN
INTRAVENOUS | Status: DC
  Filled 2021-03-10: qty 30

## 2021-03-10 MED ORDER — PLASMA-LYTE A IV SOLN
INTRAVENOUS | Status: DC
  Filled 2021-03-10: qty 5

## 2021-03-10 MED ORDER — PHENYLEPHRINE HCL-NACL 20-0.9 MG/250ML-% IV SOLN
30.0000 ug/min | INTRAVENOUS | Status: AC
  Administered 2021-03-13: 25 ug/min via INTRAVENOUS
  Filled 2021-03-10: qty 250

## 2021-03-10 NOTE — Progress Notes (Signed)
Pre-CABG (MVR) Dopplers completed. Refer to "CV Proc" under chart review to view preliminary results.  03/10/2021 11:04 AM Eula Fried., MHA, RVT, RDCS, RDMS

## 2021-03-10 NOTE — Progress Notes (Signed)
PCP - Coral Else, PA-C Cardiologist - Olga Millers, MD  PPM/ICD - Denies  Chest x-ray - 03/10/21 EKG - 03/10/21 Stress Test - per pt ~ 10-15 years ago ECHO - 10/27/20 Cardiac Cath - 02/10/21  Sleep Study - Denies  Pt is not diabetic  Blood Thinner Instructions: Per pt, last dose Eliquis 03/06/21 Aspirin Instructions: N/A  ERAS Protcol - N/A PRE-SURGERY Ensure or G2- N/A  COVID TEST- 03/10/21   Anesthesia review: Yes, cardiac hx.  Patient denies shortness of breath, fever, cough and chest pain at PAT appointment   All instructions explained to the patient, with a verbal understanding of the material. Patient agrees to go over the instructions while at home for a better understanding. Patient also instructed to self quarantine after being tested for COVID-19. The opportunity to ask questions was provided.

## 2021-03-13 ENCOUNTER — Inpatient Hospital Stay (HOSPITAL_COMMUNITY): Payer: Medicare Other

## 2021-03-13 ENCOUNTER — Inpatient Hospital Stay (HOSPITAL_COMMUNITY): Payer: Medicare Other | Admitting: Certified Registered Nurse Anesthetist

## 2021-03-13 ENCOUNTER — Encounter (HOSPITAL_COMMUNITY): Payer: Self-pay | Admitting: Thoracic Surgery (Cardiothoracic Vascular Surgery)

## 2021-03-13 ENCOUNTER — Other Ambulatory Visit: Payer: Self-pay

## 2021-03-13 ENCOUNTER — Inpatient Hospital Stay (HOSPITAL_COMMUNITY)
Admission: RE | Admit: 2021-03-13 | Discharge: 2021-03-17 | DRG: 219 | Disposition: A | Discharge status: Home / Self Care | Payer: Medicare Other | Attending: Thoracic Surgery (Cardiothoracic Vascular Surgery) | Admitting: Thoracic Surgery (Cardiothoracic Vascular Surgery)

## 2021-03-13 ENCOUNTER — Encounter (HOSPITAL_COMMUNITY)
Admission: RE | Disposition: A | Discharge status: Home / Self Care | Payer: Self-pay | Source: Home / Self Care | Attending: Thoracic Surgery (Cardiothoracic Vascular Surgery)

## 2021-03-13 DIAGNOSIS — D696 Thrombocytopenia, unspecified: Secondary | ICD-10-CM | POA: Diagnosis not present

## 2021-03-13 DIAGNOSIS — I480 Paroxysmal atrial fibrillation: Secondary | ICD-10-CM | POA: Diagnosis not present

## 2021-03-13 DIAGNOSIS — Z8701 Personal history of pneumonia (recurrent): Secondary | ICD-10-CM | POA: Diagnosis not present

## 2021-03-13 DIAGNOSIS — Z8616 Personal history of COVID-19: Secondary | ICD-10-CM

## 2021-03-13 DIAGNOSIS — J9811 Atelectasis: Secondary | ICD-10-CM | POA: Diagnosis not present

## 2021-03-13 DIAGNOSIS — I341 Nonrheumatic mitral (valve) prolapse: Secondary | ICD-10-CM | POA: Diagnosis present

## 2021-03-13 DIAGNOSIS — I350 Nonrheumatic aortic (valve) stenosis: Secondary | ICD-10-CM | POA: Diagnosis not present

## 2021-03-13 DIAGNOSIS — Z8679 Personal history of other diseases of the circulatory system: Secondary | ICD-10-CM

## 2021-03-13 DIAGNOSIS — J90 Pleural effusion, not elsewhere classified: Secondary | ICD-10-CM | POA: Diagnosis not present

## 2021-03-13 DIAGNOSIS — I712 Thoracic aortic aneurysm, without rupture: Secondary | ICD-10-CM | POA: Diagnosis present

## 2021-03-13 DIAGNOSIS — I511 Rupture of chordae tendineae, not elsewhere classified: Secondary | ICD-10-CM | POA: Diagnosis present

## 2021-03-13 DIAGNOSIS — J811 Chronic pulmonary edema: Secondary | ICD-10-CM | POA: Diagnosis not present

## 2021-03-13 DIAGNOSIS — N400 Enlarged prostate without lower urinary tract symptoms: Secondary | ICD-10-CM | POA: Diagnosis not present

## 2021-03-13 DIAGNOSIS — I517 Cardiomegaly: Secondary | ICD-10-CM | POA: Diagnosis not present

## 2021-03-13 DIAGNOSIS — Z9889 Other specified postprocedural states: Secondary | ICD-10-CM

## 2021-03-13 DIAGNOSIS — Z7901 Long term (current) use of anticoagulants: Secondary | ICD-10-CM

## 2021-03-13 DIAGNOSIS — I340 Nonrheumatic mitral (valve) insufficiency: Secondary | ICD-10-CM | POA: Diagnosis present

## 2021-03-13 HISTORY — PX: TEE WITHOUT CARDIOVERSION: SHX5443

## 2021-03-13 HISTORY — DX: Personal history of other diseases of the circulatory system: Z86.79

## 2021-03-13 HISTORY — PX: CLIPPING OF ATRIAL APPENDAGE: SHX5773

## 2021-03-13 HISTORY — DX: Other specified postprocedural states: Z98.890

## 2021-03-13 HISTORY — PX: MITRAL VALVE REPAIR: SHX2039

## 2021-03-13 HISTORY — PX: MAZE: SHX5063

## 2021-03-13 LAB — POCT I-STAT 7, (LYTES, BLD GAS, ICA,H+H)
Acid-base deficit: 3 mmol/L — ABNORMAL HIGH (ref 0.0–2.0)
Acid-base deficit: 1 mmol/L (ref 0.0–2.0)
Acid-base deficit: 2 mmol/L (ref 0.0–2.0)
Acid-base deficit: 1 mmol/L (ref 0.0–2.0)
Acid-base deficit: 2 mmol/L (ref 0.0–2.0)
Acid-base deficit: 4 mmol/L — ABNORMAL HIGH (ref 0.0–2.0)
Acid-base deficit: 4 mmol/L — ABNORMAL HIGH (ref 0.0–2.0)
Acid-base deficit: 4 mmol/L — ABNORMAL HIGH (ref 0.0–2.0)
Bicarbonate: 23.8 mmol/L (ref 20.0–28.0)
Bicarbonate: 25.2 mmol/L (ref 20.0–28.0)
Bicarbonate: 26 mmol/L (ref 20.0–28.0)
Bicarbonate: 25 mmol/L (ref 20.0–28.0)
Bicarbonate: 23 mmol/L (ref 20.0–28.0)
Bicarbonate: 22.9 mmol/L (ref 20.0–28.0)
Bicarbonate: 23.8 mmol/L (ref 20.0–28.0)
Bicarbonate: 23.9 mmol/L (ref 20.0–28.0)
Calcium, Ion: 1.28 mmol/L (ref 1.15–1.40)
Calcium, Ion: 1.05 mmol/L — ABNORMAL LOW (ref 1.15–1.40)
Calcium, Ion: 1.09 mmol/L — ABNORMAL LOW (ref 1.15–1.40)
Calcium, Ion: 1.1 mmol/L — ABNORMAL LOW (ref 1.15–1.40)
Calcium, Ion: 1.08 mmol/L — ABNORMAL LOW (ref 1.15–1.40)
Calcium, Ion: 1.13 mmol/L — ABNORMAL LOW (ref 1.15–1.40)
Calcium, Ion: 1.12 mmol/L — ABNORMAL LOW (ref 1.15–1.40)
Calcium, Ion: 1.18 mmol/L (ref 1.15–1.40)
HCT: 44 % (ref 39.0–52.0)
HCT: 33 % — ABNORMAL LOW (ref 39.0–52.0)
HCT: 34 % — ABNORMAL LOW (ref 39.0–52.0)
HCT: 33 % — ABNORMAL LOW (ref 39.0–52.0)
HCT: 29 % — ABNORMAL LOW (ref 39.0–52.0)
HCT: 33 % — ABNORMAL LOW (ref 39.0–52.0)
HCT: 38 % — ABNORMAL LOW (ref 39.0–52.0)
HCT: 40 % (ref 39.0–52.0)
Hemoglobin: 15 g/dL (ref 13.0–17.0)
Hemoglobin: 11.2 g/dL — ABNORMAL LOW (ref 13.0–17.0)
Hemoglobin: 11.6 g/dL — ABNORMAL LOW (ref 13.0–17.0)
Hemoglobin: 11.2 g/dL — ABNORMAL LOW (ref 13.0–17.0)
Hemoglobin: 9.9 g/dL — ABNORMAL LOW (ref 13.0–17.0)
Hemoglobin: 11.2 g/dL — ABNORMAL LOW (ref 13.0–17.0)
Hemoglobin: 12.9 g/dL — ABNORMAL LOW (ref 13.0–17.0)
Hemoglobin: 13.6 g/dL (ref 13.0–17.0)
O2 Saturation: 99 %
O2 Saturation: 100 %
O2 Saturation: 80 %
O2 Saturation: 100 %
O2 Saturation: 100 %
O2 Saturation: 93 %
O2 Saturation: 100 %
O2 Saturation: 85 %
Patient temperature: 37
Patient temperature: 37
Patient temperature: 37
Patient temperature: 37
Patient temperature: 37
Patient temperature: 35.6
Potassium: 4.2 mmol/L (ref 3.5–5.1)
Potassium: 4.7 mmol/L (ref 3.5–5.1)
Potassium: 4.7 mmol/L (ref 3.5–5.1)
Potassium: 5.4 mmol/L — ABNORMAL HIGH (ref 3.5–5.1)
Potassium: 5.5 mmol/L — ABNORMAL HIGH (ref 3.5–5.1)
Potassium: 4.3 mmol/L (ref 3.5–5.1)
Potassium: 4.1 mmol/L (ref 3.5–5.1)
Potassium: 4.3 mmol/L (ref 3.5–5.1)
Sodium: 141 mmol/L (ref 135–145)
Sodium: 139 mmol/L (ref 135–145)
Sodium: 140 mmol/L (ref 135–145)
Sodium: 138 mmol/L (ref 135–145)
Sodium: 139 mmol/L (ref 135–145)
Sodium: 140 mmol/L (ref 135–145)
Sodium: 141 mmol/L (ref 135–145)
Sodium: 142 mmol/L (ref 135–145)
TCO2: 25 mmol/L (ref 22–32)
TCO2: 27 mmol/L (ref 22–32)
TCO2: 27 mmol/L (ref 22–32)
TCO2: 26 mmol/L (ref 22–32)
TCO2: 24 mmol/L (ref 22–32)
TCO2: 24 mmol/L (ref 22–32)
TCO2: 25 mmol/L (ref 22–32)
TCO2: 26 mmol/L (ref 22–32)
pCO2 arterial: 47.6 mmHg (ref 32.0–48.0)
pCO2 arterial: 51.5 mmHg — ABNORMAL HIGH (ref 32.0–48.0)
pCO2 arterial: 52.1 mmHg — ABNORMAL HIGH (ref 32.0–48.0)
pCO2 arterial: 47.7 mmHg (ref 32.0–48.0)
pCO2 arterial: 39.5 mmHg (ref 32.0–48.0)
pCO2 arterial: 47.3 mmHg (ref 32.0–48.0)
pCO2 arterial: 50.9 mmHg — ABNORMAL HIGH (ref 32.0–48.0)
pCO2 arterial: 54.8 mmHg — ABNORMAL HIGH (ref 32.0–48.0)
pH, Arterial: 7.308 — ABNORMAL LOW (ref 7.350–7.450)
pH, Arterial: 7.293 — ABNORMAL LOW (ref 7.350–7.450)
pH, Arterial: 7.31 — ABNORMAL LOW (ref 7.350–7.450)
pH, Arterial: 7.328 — ABNORMAL LOW (ref 7.350–7.450)
pH, Arterial: 7.372 (ref 7.350–7.450)
pH, Arterial: 7.293 — ABNORMAL LOW (ref 7.350–7.450)
pH, Arterial: 7.247 — ABNORMAL LOW (ref 7.350–7.450)
pH, Arterial: 7.27 — ABNORMAL LOW (ref 7.350–7.450)
pO2, Arterial: 137 mmHg — ABNORMAL HIGH (ref 83.0–108.0)
pO2, Arterial: 441 mmHg — ABNORMAL HIGH (ref 83.0–108.0)
pO2, Arterial: 50 mmHg — ABNORMAL LOW (ref 83.0–108.0)
pO2, Arterial: 278 mmHg — ABNORMAL HIGH (ref 83.0–108.0)
pO2, Arterial: 244 mmHg — ABNORMAL HIGH (ref 83.0–108.0)
pO2, Arterial: 77 mmHg — ABNORMAL LOW (ref 83.0–108.0)
pO2, Arterial: 233 mmHg — ABNORMAL HIGH (ref 83.0–108.0)
pO2, Arterial: 54 mmHg — ABNORMAL LOW (ref 83.0–108.0)

## 2021-03-13 LAB — POCT I-STAT, CHEM 8
BUN: 18 mg/dL (ref 8–23)
BUN: 16 mg/dL (ref 8–23)
BUN: 15 mg/dL (ref 8–23)
BUN: 16 mg/dL (ref 8–23)
BUN: 15 mg/dL (ref 8–23)
Calcium, Ion: 1.26 mmol/L (ref 1.15–1.40)
Calcium, Ion: 1.07 mmol/L — ABNORMAL LOW (ref 1.15–1.40)
Calcium, Ion: 1.09 mmol/L — ABNORMAL LOW (ref 1.15–1.40)
Calcium, Ion: 1.11 mmol/L — ABNORMAL LOW (ref 1.15–1.40)
Calcium, Ion: 1.2 mmol/L (ref 1.15–1.40)
Chloride: 106 mmol/L (ref 98–111)
Chloride: 103 mmol/L (ref 98–111)
Chloride: 106 mmol/L (ref 98–111)
Chloride: 107 mmol/L (ref 98–111)
Chloride: 107 mmol/L (ref 98–111)
Creatinine, Ser: 1 mg/dL (ref 0.61–1.24)
Creatinine, Ser: 0.8 mg/dL (ref 0.61–1.24)
Creatinine, Ser: 0.8 mg/dL (ref 0.61–1.24)
Creatinine, Ser: 0.8 mg/dL (ref 0.61–1.24)
Creatinine, Ser: 0.8 mg/dL (ref 0.61–1.24)
Glucose, Bld: 105 mg/dL — ABNORMAL HIGH (ref 70–99)
Glucose, Bld: 146 mg/dL — ABNORMAL HIGH (ref 70–99)
Glucose, Bld: 174 mg/dL — ABNORMAL HIGH (ref 70–99)
Glucose, Bld: 178 mg/dL — ABNORMAL HIGH (ref 70–99)
Glucose, Bld: 183 mg/dL — ABNORMAL HIGH (ref 70–99)
HCT: 41 % (ref 39.0–52.0)
HCT: 33 % — ABNORMAL LOW (ref 39.0–52.0)
HCT: 31 % — ABNORMAL LOW (ref 39.0–52.0)
HCT: 32 % — ABNORMAL LOW (ref 39.0–52.0)
HCT: 32 % — ABNORMAL LOW (ref 39.0–52.0)
Hemoglobin: 13.9 g/dL (ref 13.0–17.0)
Hemoglobin: 11.2 g/dL — ABNORMAL LOW (ref 13.0–17.0)
Hemoglobin: 10.5 g/dL — ABNORMAL LOW (ref 13.0–17.0)
Hemoglobin: 10.9 g/dL — ABNORMAL LOW (ref 13.0–17.0)
Hemoglobin: 10.9 g/dL — ABNORMAL LOW (ref 13.0–17.0)
Potassium: 4.1 mmol/L (ref 3.5–5.1)
Potassium: 4.7 mmol/L (ref 3.5–5.1)
Potassium: 5.9 mmol/L — ABNORMAL HIGH (ref 3.5–5.1)
Potassium: 5.2 mmol/L — ABNORMAL HIGH (ref 3.5–5.1)
Potassium: 4.3 mmol/L (ref 3.5–5.1)
Sodium: 140 mmol/L (ref 135–145)
Sodium: 139 mmol/L (ref 135–145)
Sodium: 136 mmol/L (ref 135–145)
Sodium: 138 mmol/L (ref 135–145)
Sodium: 140 mmol/L (ref 135–145)
TCO2: 25 mmol/L (ref 22–32)
TCO2: 24 mmol/L (ref 22–32)
TCO2: 25 mmol/L (ref 22–32)
TCO2: 23 mmol/L (ref 22–32)
TCO2: 23 mmol/L (ref 22–32)

## 2021-03-13 LAB — CBC
HCT: 41.2 % (ref 39.0–52.0)
HCT: 40.6 % (ref 39.0–52.0)
Hemoglobin: 13.3 g/dL (ref 13.0–17.0)
Hemoglobin: 13.2 g/dL (ref 13.0–17.0)
MCH: 30.9 pg (ref 26.0–34.0)
MCH: 30.9 pg (ref 26.0–34.0)
MCHC: 32.3 g/dL (ref 30.0–36.0)
MCHC: 32.5 g/dL (ref 30.0–36.0)
MCV: 95.8 fL (ref 80.0–100.0)
MCV: 95.1 fL (ref 80.0–100.0)
Platelets: 93 10*3/uL — ABNORMAL LOW (ref 150–400)
Platelets: 96 10*3/uL — ABNORMAL LOW (ref 150–400)
RBC: 4.3 MIL/uL (ref 4.22–5.81)
RBC: 4.27 MIL/uL (ref 4.22–5.81)
RDW: 13.7 % (ref 11.5–15.5)
RDW: 13.7 % (ref 11.5–15.5)
WBC: 10.9 10*3/uL — ABNORMAL HIGH (ref 4.0–10.5)
WBC: 10.2 10*3/uL (ref 4.0–10.5)
nRBC: 0 % (ref 0.0–0.2)
nRBC: 0 % (ref 0.0–0.2)

## 2021-03-13 LAB — GLUCOSE, CAPILLARY
Glucose-Capillary: 107 mg/dL — ABNORMAL HIGH (ref 70–99)
Glucose-Capillary: 75 mg/dL (ref 70–99)
Glucose-Capillary: 80 mg/dL (ref 70–99)
Glucose-Capillary: 106 mg/dL — ABNORMAL HIGH (ref 70–99)
Glucose-Capillary: 105 mg/dL — ABNORMAL HIGH (ref 70–99)
Glucose-Capillary: 93 mg/dL (ref 70–99)
Glucose-Capillary: 110 mg/dL — ABNORMAL HIGH (ref 70–99)

## 2021-03-13 LAB — PREPARE RBC (CROSSMATCH)

## 2021-03-13 LAB — BASIC METABOLIC PANEL
Anion gap: 4 — ABNORMAL LOW (ref 5–15)
BUN: 14 mg/dL (ref 8–23)
CO2: 21 mmol/L — ABNORMAL LOW (ref 22–32)
Calcium: 7.7 mg/dL — ABNORMAL LOW (ref 8.9–10.3)
Chloride: 111 mmol/L (ref 98–111)
Creatinine, Ser: 0.9 mg/dL (ref 0.61–1.24)
GFR, Estimated: >60 mL/min (ref >60)
Glucose, Bld: 117 mg/dL — ABNORMAL HIGH (ref 70–99)
Potassium: 4.8 mmol/L (ref 3.5–5.1)
Sodium: 136 mmol/L (ref 135–145)

## 2021-03-13 LAB — PROTIME-INR
INR: 1.4 — ABNORMAL HIGH (ref 0.8–1.2)
Prothrombin Time: 16.9 seconds — ABNORMAL HIGH (ref 11.4–15.2)

## 2021-03-13 LAB — ABO/RH: ABO/RH(D): O POS

## 2021-03-13 LAB — PLATELET COUNT: Platelets: UNDETERMINED 10*3/uL (ref 150–400)

## 2021-03-13 LAB — PHOSPHORUS: Phosphorus: 2.6 mg/dL (ref 2.5–4.6)

## 2021-03-13 LAB — HEMOGLOBIN AND HEMATOCRIT, BLOOD
HCT: 33.4 % — ABNORMAL LOW (ref 39.0–52.0)
Hemoglobin: 10.5 g/dL — ABNORMAL LOW (ref 13.0–17.0)

## 2021-03-13 LAB — MAGNESIUM: Magnesium: 2.8 mg/dL — ABNORMAL HIGH (ref 1.7–2.4)

## 2021-03-13 LAB — APTT: aPTT: 35 seconds (ref 24–36)

## 2021-03-13 SURGERY — REPAIR, MITRAL VALVE, MINIMALLY INVASIVE
Anesthesia: General | Site: Chest | Laterality: Right

## 2021-03-13 MED ORDER — HYDROMORPHONE HCL 1 MG/ML IJ SOLN
INTRAMUSCULAR | Status: DC | PRN
  Administered 2021-03-13: .5 mg via INTRAVENOUS

## 2021-03-13 MED ORDER — INSULIN REGULAR(HUMAN) IN NACL 100-0.9 UT/100ML-% IV SOLN: INTRAVENOUS | Status: DC

## 2021-03-13 MED ORDER — SODIUM CHLORIDE 0.9 % IV SOLN: 250.0000 mL | INTRAVENOUS | Status: DC

## 2021-03-13 MED ORDER — SUCCINYLCHOLINE CHLORIDE 200 MG/10ML IV SOSY
PREFILLED_SYRINGE | INTRAVENOUS | Status: AC
  Filled 2021-03-13: qty 10

## 2021-03-13 MED ORDER — PLASMA-LYTE A IV SOLN: INTRAVENOUS | Status: DC | PRN

## 2021-03-13 MED ORDER — BISACODYL 5 MG PO TBEC
10.0000 mg | DELAYED_RELEASE_TABLET | Freq: Every day | ORAL | Status: DC
  Administered 2021-03-14 – 2021-03-16 (×2): 10 mg via ORAL
  Filled 2021-03-13 (×4): qty 2

## 2021-03-13 MED ORDER — BUPIVACAINE LIPOSOME 1.3 % IJ SUSP: INTRAMUSCULAR | Status: DC | PRN

## 2021-03-13 MED ORDER — INSULIN ASPART 100 UNIT/ML IJ SOLN
0.0000 [IU] | INTRAMUSCULAR | Status: DC
  Administered 2021-03-13: 2 [IU] via SUBCUTANEOUS

## 2021-03-13 MED ORDER — SODIUM CHLORIDE 0.9 % IV SOLN: INTRAVENOUS | Status: DC

## 2021-03-13 MED ORDER — CHLORHEXIDINE GLUCONATE 0.12 % MT SOLN
15.0000 mL | Freq: Once | OROMUCOSAL | Status: AC
  Administered 2021-03-13: 15 mL via OROMUCOSAL
  Filled 2021-03-13: qty 15

## 2021-03-13 MED ORDER — ROCURONIUM BROMIDE 10 MG/ML (PF) SYRINGE
PREFILLED_SYRINGE | INTRAVENOUS | Status: AC
  Filled 2021-03-13: qty 20

## 2021-03-13 MED ORDER — NITROGLYCERIN IN D5W 200-5 MCG/ML-% IV SOLN: 0.0000 ug/min | INTRAVENOUS | Status: DC

## 2021-03-13 MED ORDER — BLISTEX MEDICATED EX OINT
TOPICAL_OINTMENT | CUTANEOUS | Status: DC | PRN
  Filled 2021-03-13: qty 6.3

## 2021-03-13 MED ORDER — SODIUM CHLORIDE 0.9% FLUSH: 10.0000 mL | Freq: Two times a day (BID) | INTRAVENOUS | Status: DC

## 2021-03-13 MED ORDER — SUGAMMADEX SODIUM 200 MG/2ML IV SOLN
INTRAVENOUS | Status: DC | PRN
  Administered 2021-03-13: 200 mg via INTRAVENOUS

## 2021-03-13 MED ORDER — 0.9 % SODIUM CHLORIDE (POUR BTL) OPTIME
TOPICAL | Status: DC | PRN
  Administered 2021-03-13: 4000 mL

## 2021-03-13 MED ORDER — MIDAZOLAM HCL (PF) 10 MG/2ML IJ SOLN
INTRAMUSCULAR | Status: AC
  Filled 2021-03-13: qty 2

## 2021-03-13 MED ORDER — PHENYLEPHRINE 40 MCG/ML (10ML) SYRINGE FOR IV PUSH (FOR BLOOD PRESSURE SUPPORT)
PREFILLED_SYRINGE | INTRAVENOUS | Status: AC
  Filled 2021-03-13: qty 10

## 2021-03-13 MED ORDER — ACETAMINOPHEN 650 MG RE SUPP: 650.0000 mg | Freq: Once | RECTAL | Status: DC

## 2021-03-13 MED ORDER — METOPROLOL TARTRATE 5 MG/5ML IV SOLN: 2.5000 mg | INTRAVENOUS | Status: DC | PRN

## 2021-03-13 MED ORDER — SODIUM CHLORIDE 0.9% FLUSH: 10.0000 mL | INTRAVENOUS | Status: DC | PRN

## 2021-03-13 MED ORDER — PHENYLEPHRINE HCL-NACL 20-0.9 MG/250ML-% IV SOLN
0.0000 ug/min | INTRAVENOUS | Status: DC
  Administered 2021-03-14: 40 ug/min via INTRAVENOUS
  Filled 2021-03-13: qty 250

## 2021-03-13 MED ORDER — FENTANYL CITRATE (PF) 250 MCG/5ML IJ SOLN
INTRAMUSCULAR | Status: AC
  Filled 2021-03-13: qty 25

## 2021-03-13 MED ORDER — ACETAMINOPHEN 500 MG PO TABS
1000.0000 mg | ORAL_TABLET | Freq: Four times a day (QID) | ORAL | Status: DC
  Administered 2021-03-13 – 2021-03-17 (×15): 1000 mg via ORAL
  Filled 2021-03-13 (×15): qty 2

## 2021-03-13 MED ORDER — CHLORHEXIDINE GLUCONATE CLOTH 2 % EX PADS
6.0000 | MEDICATED_PAD | Freq: Every day | CUTANEOUS | Status: DC
  Administered 2021-03-13 – 2021-03-16 (×3): 6 via TOPICAL

## 2021-03-13 MED ORDER — BUPIVACAINE HCL (PF) 0.5 % IJ SOLN
INTRAMUSCULAR | Status: AC
  Filled 2021-03-13: qty 30

## 2021-03-13 MED ORDER — SODIUM CHLORIDE 0.45 % IV SOLN: INTRAVENOUS | Status: DC | PRN

## 2021-03-13 MED ORDER — ASPIRIN 81 MG PO CHEW: 324.0000 mg | CHEWABLE_TABLET | Freq: Every day | ORAL | Status: DC

## 2021-03-13 MED ORDER — TRAMADOL HCL 50 MG PO TABS
50.0000 mg | ORAL_TABLET | ORAL | Status: DC | PRN
  Administered 2021-03-14 – 2021-03-17 (×12): 100 mg via ORAL
  Filled 2021-03-13 (×12): qty 2

## 2021-03-13 MED ORDER — PROTAMINE SULFATE 10 MG/ML IV SOLN
INTRAVENOUS | Status: AC
  Filled 2021-03-13: qty 25

## 2021-03-13 MED ORDER — LACTATED RINGERS IV SOLN: 500.0000 mL | Freq: Once | INTRAVENOUS | Status: DC | PRN

## 2021-03-13 MED ORDER — HEPARIN SODIUM (PORCINE) 1000 UNIT/ML IJ SOLN
INTRAMUSCULAR | Status: DC | PRN
  Administered 2021-03-13: 37000 [IU] via INTRAVENOUS

## 2021-03-13 MED ORDER — ARTIFICIAL TEARS OPHTHALMIC OINT
TOPICAL_OINTMENT | OPHTHALMIC | Status: AC
  Filled 2021-03-13: qty 3.5

## 2021-03-13 MED ORDER — LACTATED RINGERS IV SOLN: INTRAVENOUS | Status: DC

## 2021-03-13 MED ORDER — ROCURONIUM BROMIDE 10 MG/ML (PF) SYRINGE
PREFILLED_SYRINGE | INTRAVENOUS | Status: AC
  Filled 2021-03-13: qty 10

## 2021-03-13 MED ORDER — BUPIVACAINE LIPOSOME 1.3 % IJ SUSP
INTRAMUSCULAR | Status: AC
  Filled 2021-03-13: qty 20

## 2021-03-13 MED ORDER — MAGNESIUM SULFATE 4 GM/100ML IV SOLN
4.0000 g | Freq: Once | INTRAVENOUS | Status: AC
  Administered 2021-03-13: 4 g via INTRAVENOUS
  Filled 2021-03-13: qty 100

## 2021-03-13 MED ORDER — SODIUM CHLORIDE 0.9% FLUSH
3.0000 mL | Freq: Two times a day (BID) | INTRAVENOUS | Status: DC
  Administered 2021-03-14 – 2021-03-16 (×6): 3 mL via INTRAVENOUS

## 2021-03-13 MED ORDER — ACETAMINOPHEN 10 MG/ML IV SOLN
INTRAVENOUS | Status: AC
  Filled 2021-03-13: qty 100

## 2021-03-13 MED ORDER — LACTATED RINGERS IV SOLN: INTRAVENOUS | Status: DC | PRN

## 2021-03-13 MED ORDER — DEXMEDETOMIDINE (PRECEDEX) IN NS 20 MCG/5ML (4 MCG/ML) IV SYRINGE
PREFILLED_SYRINGE | INTRAVENOUS | Status: DC | PRN
  Administered 2021-03-13 (×2): 10 ug via INTRAVENOUS

## 2021-03-13 MED ORDER — DOCUSATE SODIUM 100 MG PO CAPS
200.0000 mg | ORAL_CAPSULE | Freq: Every day | ORAL | Status: DC
  Administered 2021-03-14 – 2021-03-16 (×2): 200 mg via ORAL
  Filled 2021-03-13 (×4): qty 2

## 2021-03-13 MED ORDER — EPHEDRINE 5 MG/ML INJ
INTRAVENOUS | Status: AC
  Filled 2021-03-13: qty 10

## 2021-03-13 MED ORDER — OXYCODONE HCL 5 MG PO TABS
5.0000 mg | ORAL_TABLET | ORAL | Status: DC | PRN
  Administered 2021-03-13 – 2021-03-14 (×5): 10 mg via ORAL
  Administered 2021-03-14: 5 mg via ORAL
  Administered 2021-03-14: 10 mg via ORAL
  Administered 2021-03-14: 5 mg via ORAL
  Administered 2021-03-15: 10 mg via ORAL
  Administered 2021-03-15 – 2021-03-17 (×5): 5 mg via ORAL
  Filled 2021-03-13 (×2): qty 2
  Filled 2021-03-13: qty 1
  Filled 2021-03-13: qty 2
  Filled 2021-03-13: qty 1
  Filled 2021-03-13 (×2): qty 2
  Filled 2021-03-13 (×2): qty 1
  Filled 2021-03-13: qty 2
  Filled 2021-03-13: qty 1
  Filled 2021-03-13 (×3): qty 2

## 2021-03-13 MED ORDER — PANTOPRAZOLE SODIUM 40 MG PO TBEC
40.0000 mg | DELAYED_RELEASE_TABLET | Freq: Every day | ORAL | Status: DC
  Administered 2021-03-15 – 2021-03-17 (×3): 40 mg via ORAL
  Filled 2021-03-13 (×3): qty 1

## 2021-03-13 MED ORDER — MORPHINE SULFATE (PF) 2 MG/ML IV SOLN
1.0000 mg | INTRAVENOUS | Status: DC | PRN
  Administered 2021-03-13 (×3): 4 mg via INTRAVENOUS
  Administered 2021-03-14 (×2): 2 mg via INTRAVENOUS
  Filled 2021-03-13: qty 1
  Filled 2021-03-13 (×2): qty 2
  Filled 2021-03-13: qty 1

## 2021-03-13 MED ORDER — CEFAZOLIN SODIUM-DEXTROSE 2-4 GM/100ML-% IV SOLN
2.0000 g | Freq: Three times a day (TID) | INTRAVENOUS | Status: AC
  Administered 2021-03-13 – 2021-03-15 (×5): 2 g via INTRAVENOUS
  Filled 2021-03-13 (×7): qty 100

## 2021-03-13 MED ORDER — HEPARIN SODIUM (PORCINE) 1000 UNIT/ML IJ SOLN
INTRAMUSCULAR | Status: AC
  Filled 2021-03-13: qty 1

## 2021-03-13 MED ORDER — SODIUM CHLORIDE 0.9 % IV SOLN: INTRAVENOUS | Status: DC | PRN

## 2021-03-13 MED ORDER — FAMOTIDINE IN NACL 20-0.9 MG/50ML-% IV SOLN
20.0000 mg | Freq: Two times a day (BID) | INTRAVENOUS | Status: DC
  Administered 2021-03-14: 20 mg via INTRAVENOUS
  Filled 2021-03-13: qty 50

## 2021-03-13 MED ORDER — ALBUMIN HUMAN 5 % IV SOLN
250.0000 mL | INTRAVENOUS | Status: DC | PRN
  Administered 2021-03-14 (×2): 12.5 g via INTRAVENOUS
  Filled 2021-03-13 (×2): qty 250

## 2021-03-13 MED ORDER — HYDROMORPHONE HCL 1 MG/ML IJ SOLN
INTRAMUSCULAR | Status: AC
  Filled 2021-03-13: qty 0.5

## 2021-03-13 MED ORDER — LIDOCAINE 2% (20 MG/ML) 5 ML SYRINGE
INTRAMUSCULAR | Status: AC
  Filled 2021-03-13: qty 5

## 2021-03-13 MED ORDER — PROPOFOL 10 MG/ML IV BOLUS
INTRAVENOUS | Status: DC | PRN
  Administered 2021-03-13: 110 mg via INTRAVENOUS
  Administered 2021-03-13: 20 mg via INTRAVENOUS

## 2021-03-13 MED ORDER — METOPROLOL TARTRATE 12.5 MG HALF TABLET
12.5000 mg | ORAL_TABLET | Freq: Once | ORAL | Status: AC
Start: 2021-03-13 — End: 2021-03-13
  Administered 2021-03-13: 12.5 mg via ORAL
  Filled 2021-03-13: qty 1

## 2021-03-13 MED ORDER — VANCOMYCIN HCL IN DEXTROSE 1-5 GM/200ML-% IV SOLN
1000.0000 mg | Freq: Once | INTRAVENOUS | Status: AC
  Administered 2021-03-13: 1000 mg via INTRAVENOUS
  Filled 2021-03-13: qty 200

## 2021-03-13 MED ORDER — ~~LOC~~ CARDIAC SURGERY, PATIENT & FAMILY EDUCATION
Freq: Once | Status: DC
  Filled 2021-03-13: qty 1

## 2021-03-13 MED ORDER — DEXMEDETOMIDINE HCL IN NACL 400 MCG/100ML IV SOLN
0.0000 ug/kg/h | INTRAVENOUS | Status: DC
  Administered 2021-03-13: 0.2 ug/kg/h via INTRAVENOUS

## 2021-03-13 MED ORDER — ASPIRIN EC 325 MG PO TBEC
325.0000 mg | DELAYED_RELEASE_TABLET | Freq: Every day | ORAL | Status: AC
  Administered 2021-03-14 – 2021-03-17 (×4): 325 mg via ORAL
  Filled 2021-03-13 (×4): qty 1

## 2021-03-13 MED ORDER — SODIUM CHLORIDE 0.9% FLUSH: 3.0000 mL | INTRAVENOUS | Status: DC | PRN

## 2021-03-13 MED ORDER — WHITE PETROLATUM EX OINT
TOPICAL_OINTMENT | CUTANEOUS | Status: AC
  Filled 2021-03-13: qty 28.35

## 2021-03-13 MED ORDER — DEXTROSE 50 % IV SOLN: 0.0000 mL | INTRAVENOUS | Status: DC | PRN

## 2021-03-13 MED ORDER — FENTANYL CITRATE (PF) 250 MCG/5ML IJ SOLN
INTRAMUSCULAR | Status: DC | PRN
  Administered 2021-03-13 (×2): 50 ug via INTRAVENOUS
  Administered 2021-03-13: 150 ug via INTRAVENOUS

## 2021-03-13 MED ORDER — MIDAZOLAM HCL 2 MG/2ML IJ SOLN: 2.0000 mg | INTRAMUSCULAR | Status: DC | PRN

## 2021-03-13 MED ORDER — BISACODYL 10 MG RE SUPP: 10.0000 mg | Freq: Every day | RECTAL | Status: DC

## 2021-03-13 MED ORDER — ACETAMINOPHEN 160 MG/5ML PO SOLN: 1000.0000 mg | Freq: Four times a day (QID) | ORAL | Status: DC

## 2021-03-13 MED ORDER — PROPOFOL 10 MG/ML IV BOLUS
INTRAVENOUS | Status: AC
  Filled 2021-03-13: qty 20

## 2021-03-13 MED ORDER — POTASSIUM CHLORIDE 10 MEQ/50ML IV SOLN
10.0000 meq | INTRAVENOUS | Status: AC
Start: 2021-03-13 — End: 2021-03-13

## 2021-03-13 MED ORDER — CHLORHEXIDINE GLUCONATE 4 % EX LIQD: 30.0000 mL | CUTANEOUS | Status: DC

## 2021-03-13 MED ORDER — VANCOMYCIN HCL 1000 MG IV SOLR: INTRAVENOUS | Status: DC | PRN

## 2021-03-13 MED ORDER — PROTAMINE SULFATE 10 MG/ML IV SOLN
INTRAVENOUS | Status: DC | PRN
  Administered 2021-03-13: 370 mg via INTRAVENOUS

## 2021-03-13 MED ORDER — ONDANSETRON HCL 4 MG/2ML IJ SOLN
4.0000 mg | Freq: Four times a day (QID) | INTRAMUSCULAR | Status: DC | PRN
  Administered 2021-03-15: 4 mg via INTRAVENOUS
  Filled 2021-03-13: qty 2

## 2021-03-13 MED ORDER — CHLORHEXIDINE GLUCONATE 0.12 % MT SOLN
15.0000 mL | OROMUCOSAL | Status: AC
  Administered 2021-03-13: 15 mL via OROMUCOSAL
  Filled 2021-03-13: qty 15

## 2021-03-13 MED ORDER — ONDANSETRON HCL 4 MG/2ML IJ SOLN
INTRAMUSCULAR | Status: DC | PRN
  Administered 2021-03-13: 4 mg via INTRAVENOUS

## 2021-03-13 MED ORDER — MIDAZOLAM HCL 5 MG/5ML IJ SOLN
INTRAMUSCULAR | Status: DC | PRN
  Administered 2021-03-13 (×3): 1 mg via INTRAVENOUS

## 2021-03-13 MED ORDER — CHLORHEXIDINE GLUCONATE CLOTH 2 % EX PADS
6.0000 | MEDICATED_PAD | Freq: Every day | CUTANEOUS | Status: DC
  Administered 2021-03-13: 6 via TOPICAL

## 2021-03-13 MED ORDER — ROCURONIUM BROMIDE 10 MG/ML (PF) SYRINGE
PREFILLED_SYRINGE | INTRAVENOUS | Status: DC | PRN
  Administered 2021-03-13 (×2): 20 mg via INTRAVENOUS
  Administered 2021-03-13: 50 mg via INTRAVENOUS
  Administered 2021-03-13: 80 mg via INTRAVENOUS

## 2021-03-13 MED ORDER — ACETAMINOPHEN 10 MG/ML IV SOLN
INTRAVENOUS | Status: DC | PRN
  Administered 2021-03-13: 1000 mg via INTRAVENOUS

## 2021-03-13 MED ORDER — ACETAMINOPHEN 160 MG/5ML PO SOLN: 650.0000 mg | Freq: Once | ORAL | Status: DC

## 2021-03-13 SURGICAL SUPPLY — 129 items
ADAPTER CARDIO PERF ANTE/RETRO (ADAPTER) ×4 IMPLANT
ARTICLIP LAA PROCLIP II 45 (Clip) ×4 IMPLANT
BAG DECANTER FOR FLEXI CONT (MISCELLANEOUS) ×8 IMPLANT
BLADE CLIPPER SURG (BLADE) ×4 IMPLANT
BLADE STERNUM SYSTEM 6 (BLADE) ×4 IMPLANT
BLADE SURG 11 STRL SS (BLADE) ×4 IMPLANT
CANISTER SUCT 3000ML PPV (MISCELLANEOUS) ×8 IMPLANT
CANNULA ADULT BIO-MEDICUS 15FR (CANNULA) ×4 IMPLANT
CANNULA FEM BIOMEDICUS 25FR (CANNULA) ×4 IMPLANT
CANNULA GUNDRY RCSP 15FR (MISCELLANEOUS) ×4 IMPLANT
CANNULA OPTISITE PERFUSION 18F (CANNULA) ×4 IMPLANT
CANNULA SUMP PERICARDIAL (CANNULA) ×8 IMPLANT
CATH THORACIC 36FR (CATHETERS) IMPLANT
CELLS DAT CNTRL 66122 CELL SVR (MISCELLANEOUS) ×3 IMPLANT
CLAMP ISOLATOR SYNERGY LG (MISCELLANEOUS) ×4 IMPLANT
CLIP FOGARTY SPRING 6M (CLIP) IMPLANT
CNTNR URN SCR LID CUP LEK RST (MISCELLANEOUS) ×3 IMPLANT
CONN 1/2X1/2X1/2  BEN (MISCELLANEOUS) ×1
CONN 1/2X1/2X1/2 BEN (MISCELLANEOUS) ×3 IMPLANT
CONN 3/8X1/2 ST GISH (MISCELLANEOUS) ×8 IMPLANT
CONN ST 1/4X3/8  BEN (MISCELLANEOUS) ×2
CONN ST 1/4X3/8 BEN (MISCELLANEOUS) ×6 IMPLANT
CONN ST 3/8 X 1/2 (MISCELLANEOUS) ×4 IMPLANT
CONNECTOR 1/2X3/8X1/2 3 WAY (MISCELLANEOUS) ×1
CONNECTOR 1/2X3/8X1/2 3WAY (MISCELLANEOUS) ×3 IMPLANT
CONT SPEC 4OZ STRL OR WHT (MISCELLANEOUS) ×1
CONTAINER PROTECT SURGISLUSH (MISCELLANEOUS) ×4 IMPLANT
COVER BACK TABLE 24X17X13 BIG (DRAPES) ×4 IMPLANT
COVER PROBE W GEL 5X96 (DRAPES) ×4 IMPLANT
COVER SURGICAL LIGHT HANDLE (MISCELLANEOUS) ×4 IMPLANT
COVER ULTRASOUND PROBE 36 ST (MISCELLANEOUS) ×4 IMPLANT
DERMABOND ADVANCED (GAUZE/BANDAGES/DRESSINGS) ×1
DERMABOND ADVANCED .7 DNX12 (GAUZE/BANDAGES/DRESSINGS) ×3 IMPLANT
DEVICE ATRICLIP LAA PRCLPII 45 (Clip) ×3 IMPLANT
DEVICE CLOSURE PERCLS PRGLD 6F (VASCULAR PRODUCTS) ×12 IMPLANT
DEVICE SUT CK QUICK LOAD MINI (Prosthesis & Implant Heart) ×8 IMPLANT
DEVICE TROCAR PUNCTURE CLOSURE (ENDOMECHANICALS) ×4 IMPLANT
DRAIN CHANNEL 32F RND 10.7 FF (WOUND CARE) ×8 IMPLANT
DRAPE C-ARM 42X72 X-RAY (DRAPES) ×4 IMPLANT
DRAPE CV SPLIT W-CLR ANES SCRN (DRAPES) ×4 IMPLANT
DRAPE INCISE IOBAN 66X45 STRL (DRAPES) ×12 IMPLANT
DRAPE PERI GROIN 82X75IN TIB (DRAPES) ×4 IMPLANT
DRAPE WARM FLUID 44X44 (DRAPES) ×4 IMPLANT
DRSG AQUACEL AG ADV 3.5X10 (GAUZE/BANDAGES/DRESSINGS) ×4 IMPLANT
DRSG AQUACEL AG ADV 3.5X14 (GAUZE/BANDAGES/DRESSINGS) ×4 IMPLANT
ELECT BLADE 6.5 EXT (BLADE) ×4 IMPLANT
ELECT REM PT RETURN 9FT ADLT (ELECTROSURGICAL) ×8
ELECTRODE REM PT RTRN 9FT ADLT (ELECTROSURGICAL) ×6 IMPLANT
FELT TEFLON 1X6 (MISCELLANEOUS) ×4 IMPLANT
FEMORAL VENOUS CANN RAP (CANNULA) IMPLANT
GAUZE SPONGE 4X4 12PLY STRL (GAUZE/BANDAGES/DRESSINGS) ×4 IMPLANT
GAUZE SPONGE 4X4 12PLY STRL LF (GAUZE/BANDAGES/DRESSINGS) ×4 IMPLANT
GLOVE SURG MICRO LTX SZ6 (GLOVE) ×8 IMPLANT
GLOVE SURG MICRO LTX SZ6.5 (GLOVE) ×20 IMPLANT
GLOVE SURG ORTHO LTX SZ7.5 (GLOVE) ×12 IMPLANT
GLOVE SURG UNDER POLY LF SZ6 (GLOVE) ×12 IMPLANT
GOWN STRL REUS W/ TWL LRG LVL3 (GOWN DISPOSABLE) ×21 IMPLANT
GOWN STRL REUS W/TWL LRG LVL3 (GOWN DISPOSABLE) ×7
GRASPER SUT TROCAR 14GX15 (MISCELLANEOUS) ×4 IMPLANT
INSERT FOGARTY XLG (MISCELLANEOUS) ×4 IMPLANT
IV NS 1000ML (IV SOLUTION) ×1
IV NS 1000ML BAXH (IV SOLUTION) ×3 IMPLANT
IV NS IRRIG 3000ML ARTHROMATIC (IV SOLUTION) ×4 IMPLANT
KIT BASIN OR (CUSTOM PROCEDURE TRAY) ×4 IMPLANT
KIT DILATOR VASC 18G NDL (KITS) ×4 IMPLANT
KIT DRAINAGE VACCUM ASSIST (KITS) ×4 IMPLANT
KIT MICROPUNCTURE NIT STIFF (SHEATH) ×4 IMPLANT
KIT SUCTION CATH 14FR (SUCTIONS) ×12 IMPLANT
KIT SUT CK MINI COMBO 4X17 (Prosthesis & Implant Heart) ×4 IMPLANT
KIT TURNOVER KIT B (KITS) ×4 IMPLANT
LEAD PACING MYOCARDI (MISCELLANEOUS) ×4 IMPLANT
LINE VENT (MISCELLANEOUS) ×4 IMPLANT
LOOP VESSEL SUPERMAXI WHITE (MISCELLANEOUS) ×4 IMPLANT
NEEDLE AORTIC ROOT 14G 7F (CATHETERS) ×4 IMPLANT
NS IRRIG 1000ML POUR BTL (IV SOLUTION) ×16 IMPLANT
PACK E MIN INVASIVE VALVE (SUTURE) ×4 IMPLANT
PACK OPEN HEART (CUSTOM PROCEDURE TRAY) ×4 IMPLANT
PAD ARMBOARD 7.5X6 YLW CONV (MISCELLANEOUS) ×8 IMPLANT
PAD ELECT DEFIB RADIOL ZOLL (MISCELLANEOUS) ×4 IMPLANT
PENCIL BUTTON HOLSTER BLD 10FT (ELECTRODE) IMPLANT
PERCLOSE PROGLIDE 6F (VASCULAR PRODUCTS) ×16
POSITIONER HEAD DONUT 9IN (MISCELLANEOUS) ×4 IMPLANT
PROBE CRYO2-ABLATION MALLABLE (MISCELLANEOUS) ×4 IMPLANT
RING ANLPLS SIMUFORM 36 (Prosthesis & Implant Heart) ×3 IMPLANT
RING ANNULOPLASTY SIMUFORM 36 (Prosthesis & Implant Heart) ×1 IMPLANT
RTRCTR WOUND ALEXIS 18CM MED (MISCELLANEOUS) ×4
SET CANNULATION TOURNIQUET (MISCELLANEOUS) ×4 IMPLANT
SET IRRIG TUBING LAPAROSCOPIC (IRRIGATION / IRRIGATOR) ×4 IMPLANT
SET MICROPUNCTURE 5F STIFF (MISCELLANEOUS) ×4 IMPLANT
SET MPS 3-ND DEL (MISCELLANEOUS) ×4 IMPLANT
SHEATH PINNACLE 8F 10CM (SHEATH) ×24 IMPLANT
SOL ANTI FOG 6CC (MISCELLANEOUS) ×3 IMPLANT
SOLUTION ANTI FOG 6CC (MISCELLANEOUS) ×1
SUT BONE WAX W31G (SUTURE) ×4 IMPLANT
SUT ETHIBOND (SUTURE) ×8 IMPLANT
SUT ETHIBOND 2 0 SH (SUTURE) ×3
SUT ETHIBOND 2 0 SH 36X2 (SUTURE) ×9 IMPLANT
SUT ETHIBOND 2-0 RB-1 WHT (SUTURE) ×8 IMPLANT
SUT ETHIBOND X763 2 0 SH 1 (SUTURE) ×8 IMPLANT
SUT GORETEX CV 4 TH 22 36 (SUTURE) IMPLANT
SUT GORETEX CV4 TH-18 (SUTURE) IMPLANT
SUT MNCRL AB 3-0 PS2 18 (SUTURE) ×8 IMPLANT
SUT PDS AB 1 CTX 36 (SUTURE) ×8 IMPLANT
SUT PROLENE 3 0 SH 1 (SUTURE) ×4 IMPLANT
SUT PROLENE 3 0 SH1 36 (SUTURE) ×20 IMPLANT
SUT PROLENE 4 0 RB 1 (SUTURE) ×2
SUT PROLENE 4 0 SH DA (SUTURE) ×8 IMPLANT
SUT PROLENE 4-0 RB1 .5 CRCL 36 (SUTURE) ×6 IMPLANT
SUT PTFE CHORD X 16MM (SUTURE) ×4 IMPLANT
SUT SILK  1 MH (SUTURE) ×3
SUT SILK 1 MH (SUTURE) ×9 IMPLANT
SUT STEEL 6MS V (SUTURE) IMPLANT
SUT VIC AB 2-0 CTX 27 (SUTURE) IMPLANT
SYSTEM SAHARA CHEST DRAIN ATS (WOUND CARE) ×4 IMPLANT
TAPE CLOTH SURG 4X10 WHT LF (GAUZE/BANDAGES/DRESSINGS) ×4 IMPLANT
TAPE PAPER 2X10 WHT MICROPORE (GAUZE/BANDAGES/DRESSINGS) ×4 IMPLANT
TOWEL GREEN STERILE (TOWEL DISPOSABLE) ×4 IMPLANT
TOWEL GREEN STERILE FF (TOWEL DISPOSABLE) ×4 IMPLANT
TRAY FOLEY SLVR 16FR TEMP STAT (SET/KITS/TRAYS/PACK) ×4 IMPLANT
TROCAR XCEL BLADELESS 5X75MML (TROCAR) ×4 IMPLANT
TROCAR XCEL NON-BLD 11X100MML (ENDOMECHANICALS) ×8 IMPLANT
TUBE SUCT INTRACARD DLP 20F (MISCELLANEOUS) ×4 IMPLANT
TUBING ART PRESS 72  MALE/FEM (TUBING) ×1
TUBING ART PRESS 72 MALE/FEM (TUBING) ×3 IMPLANT
TUNNELER SHEATH ON-Q 11GX8 DSP (PAIN MANAGEMENT) IMPLANT
UNDERPAD 30X36 HEAVY ABSORB (UNDERPADS AND DIAPERS) ×4 IMPLANT
WATER STERILE IRR 1000ML POUR (IV SOLUTION) ×8 IMPLANT
WIRE EMERALD 3MM-J .035X150CM (WIRE) ×4 IMPLANT
WIRE HI TORQ VERSACORE-J 145CM (WIRE) ×4 IMPLANT

## 2021-03-13 NOTE — Interval H&P Note (Signed)
History and Physical Interval Note:  03/13/2021 5:57 AM  Johnny [name]  has presented today for surgery, with the diagnosis of MR, Afib.  The various methods of treatment have been discussed with the patient and family. After consideration of risks, benefits and other options for treatment, the patient has consented to  Procedure(s): MINIMALLY INVASIVE MITRAL VALVE REPAIR (MVR) (Right) MAZE (N/A) TRANSESOPHAGEAL ECHOCARDIOGRAM (TEE) (N/A) as a surgical intervention.  The patient's history has been reviewed, patient examined, no change in status, stable for surgery.  I have reviewed the patient's chart and labs.  Questions were answered to the patient's satisfaction.     Purcell Nails

## 2021-03-13 NOTE — Op Note (Signed)
CARDIOTHORACIC SURGERY OPERATIVE NOTE  Date of Procedure:   03/13/2021  Preoperative Diagnosis:   Severe Mitral Regurgitation Paroxysmal Atrial Fibrillation  Postoperative Diagnosis: Same  Procedure:   Minimally-Invasive Mitral Valve Repair  Complex valvuloplasty including artificial PTFE neochord placement x4  Medtronic Simuform Ring Annuloplasty (size 43mm, model [id number], serial [id number])  Minimally-Invasive Maze Procedure  Left atrial lesion set using cryothermy ablation  Clipping of Left Atrial Appendage (Atricure Pro245 left atrial clip, size 45 mm)  Surgeon: Salvatore Decent. 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   Patient states he has known of presence of a heart murmur for many years.  He states that he was told that he had rheumatic fever at age 63.  He has been followed for more than 10 years by Dr. Jens Som having originally presented with an episode of paroxysmal atrial fibrillation in the remote past.  He has been on long-term anticoagulation using Eliquis.  He has remained in sinus rhythm until last summer when he had another episode of paroxysmal atrial fibrillation that did not require cardioversion.  Transthoracic echocardiogram performed at that time revealed normal left ventricular  systolic function with mitral valve prolapse and what was felt to be mild mitral regurgitation.   In early February of this year the patient developed COVID-19 pneumonia.  More than a week into his illness he suffered a syncopal episode at home for which she was hospitalized.  Work-up in the hospital included EKG, CTA of the chest, CT of the head and cervical spine, chest x-ray, orthostatic vital signs, carotid ultrasound, and blood work.  All of this turned out to be negative.   Transthoracic echocardiogram performed at that time revealed normal left ventricular systolic function with an obvious flail segment of the posterior leaflet of the mitral valve with at least moderate mitral regurgitation.  The patient was ultimately discharged home with his syncopal episode attributed to possible vasovagal event.  Holter monitor was planned but apparently never completed.  The patient was seen in follow-up by Dr. Jens Som and transesophageal echocardiogram performed Jan 16, 2021 to further evaluate the severity of mitral regurgitation.  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  Left ventricular size and systolic function appeared normal.  There was moderate left atrial enlargement.  The patient was subsequently referred for elective surgical consultation.  The patient has been seen in consultation and counseled at length regarding the indications, risks and potential benefits of surgery.  All questions have been answered, and the patient provides full informed consent for the operation as described.    DETAILS OF THE OPERATIVE PROCEDURE  Preparation:  The patient is brought to the operating room on the above mentioned date and central monitoring was established by the anesthesia team including placement of Swan-Ganz catheter through the left  internal jugular vein.  A radial  arterial line is placed. The patient is placed in the supine position on the operating table.  Intravenous antibiotics are administered. General endotracheal anesthesia is induced uneventfully. The patient is initially intubated using a dual lumen endotracheal tube.  A Foley catheter is placed.  Baseline transesophageal echocardiogram was performed.  Findings were notable for myxomatous degenerative disease of the mitral valve with a large flail segment involving the majority of the middle scallop (P2) of the posterior leaflet.  There were multiple ruptured primary chordae tendinae.  There was severe mitral regurgitation.  There was normal left ventricular systolic function.  The aortic root was dilated but the aortic valve was functioning normally with no aortic insufficiency.  Right ventricular size and function was normal.  There was trivial tricuspid regurgitation.  A soft roll is placed behind the patient's left scapula and the neck gently extended and turned to the left.   The patient's right neck, chest, abdomen, both groins, and both lower extremities are prepared and draped in a sterile manner. A time out procedure is performed.   Percutaneous Vascular Access:  Percutaneous arterial and venous access were obtained on the right side.  Using ultrasound guidance the right common femoral vein was cannulated using the Seldinger technique and a pair of Perclose vascular closure devises were placed at opposing 30 degree angles in the femoral vein, after which time an 8 French sheath inserted.  The right common femoral artery was cannulated using a micropuncture wire and sheath.  A pair of Perclose vascular closure devices were placed at opposing 30 degree angles in the femoral artery, and a 8 French sheath inserted.  The right internal jugular vein was cannulated  using ultrasound guidance and an 8 French sheath inserted.     Surgical Approach:  A right miniature anterolateral thoracotomy incision is  performed. The incision is placed just lateral to and superior to the right nipple. The pectoralis major muscle is retracted medially and completely preserved. The right pleural space is entered through the 3rd intercostal space. A soft tissue retractor is placed.  Two 11 mm ports are placed through separate stab incisions inferiorly. The right pleural space is insufflated continuously with carbon dioxide gas through the posterior port during the remainder of the operation.  A pledgeted sutures placed through the dome of the right hemidiaphragm and retracted inferiorly to facilitate exposure.  A longitudinal incision is made in the pericardium 3 cm anterior to the phrenic nerve and silk traction sutures are placed on either side of the incision for exposure.   Extracorporeal Cardiopulmonary Bypass and Myocardial Protection:   The patient was heparinized systemically.  The right common femoral vein is cannulated through the venous sheath and a guidewire advanced into the right atrium using TEE guidance.  The femoral vein cannulated using a 25 Fr long femoral venous cannula.  The right common femoral artery is cannulated through the arterial sheath and a guidewire advanced into the descending thoracic aorta using TEE guidance.  Femoral artery is cannulated with a 18 French femoral arterial cannula.  The right internal jugular vein is cannulated through the venous sheath and a guidewire advanced into the right atrium.  The internal jugular vein is cannulated using a 14 French pediatric femoral venous cannula.   Adequate heparinization is verified.   The entire pre-bypass portion of the operation was notable for stable hemodynamics.  Cardiopulmonary bypass was begun.  Vacuum assist venous drainage is utilized. The incision in the pericardium  is extended in both directions. Venous drainage and exposure are notably excellent.    Clipping of Left Atrial Appendage:  The left atrial appendage is obliterated  using an Atricure left atrial appendage clip (Atriclip Pro245, size 45mm).  The clip is applied under thoracoscopic visualization posterior to the aorta and pulmonary artery through the oblique sinus.  The clip was applied prior to application of the aortic crossclamp, with transesophageal echocardiographic confirmation that the clip satisfactorily obliterates the appendage.   Myocardial Protection:  A retrograde cardioplegia cannula is placed through the right atrium into the coronary sinus using transesophageal echocardiogram guidance.  An antegrade cardioplegia cannula is placed in the ascending aorta.  The patient is cooled to 32C systemic temperature.  The aortic cross clamp is applied and cardioplegia is delivered initially in an antegrade fashion through the aortic root using modified [place] cold blood cardioplegia ([place] blood cardioplegia protocol).   The initial cardioplegic arrest is rapid with early diastolic arrest.  Myocardial protection was felt to be excellent.   Maze Procedure (left atrial lesion set):  Following placement of the aortic crossclamp and the administration of the initial arresting dose of cardioplegia, Waterston's groove is dissected.  A left atriotomy incision was performed through the interatrial groove and extended partially across the back wall of the left atrium after opening the oblique sinus inferiorly.  The mitral valve and floor of the left atrium are exposed using a self-retaining retractor.    The Atricure CryoICE nitrous oxide cryothermy system is utilized for all cryothermy ablation lesions using 3 minute duration.  The left atrial lesion set of the Cox maze IV procedure is performed.  The entire left sided lesion set is completed using cryothermy.  A cryothermy lesion is placed along the endocardial surface of the left atrium from the caudad apex of the atriotomy incision across the posterior wall of the left atrium onto the posterior mitral annulus.   A mirror image lesion along the epicardial surface is then performed with the probe posterior to the left atrium, crossing over the coronary sinus.  Two lesions are then performed to create a box isolating all of the pulmonary veins from the remainder of the left atrium.  The first lesion is placed from the cephalad apex of the atriotomy incision across the dome of the left atrium to just anterior to the left sided pulmonary veins.  This lesion connects to the base of the left atrial appendage.  The second lesion completes the box from the caudad apex of the atriotomy incision across the back wall of the left atrium to connect with the previous lesion just anterior to the left sided pulmonary veins.     Mitral Valve Repair:  The mitral valve was inspected and notable for forme fruste variant of Barlow's type myxomatous degenerative disease.  There was a large flail middle scallop (P2) of the posterior leaflet.  All of the primary chordae tendinae from the anterolateral papillary muscle were ruptured.  There was elongation of the cords from the posterior medial papillary muscle.  Artificial neochord placement was performed using Chord-X multi-strand CV-4 PTFE pre-measured loops.  The appropriate cord length (16mm) was measured from corresponding normal length primary cords from the P3 segment of the posterior leaflet. The papillary muscle suture of a Chord-X multi-strand suture was placed through the head of the anterolateral papillary muscle in a horizontal mattress fashion and tied over Teflon felt pledgets. Two of the three pre-measured loops were then reimplanted into the free margin  of the P2 segment of the posterior leaflet on the anterior side of midline.  The remaining loop was discarded.  Interrupted 2-0 Ethibond horizontal mattress sutures are placed circumferentially around the entire mitral valve annulus. The sutures will ultimately be utilized for ring annuloplasty, and at this juncture there  are utilized to suspend the valve symmetrically.  The valve is tested with saline and appears reasonably competent even prior to ring annuloplasty.  The valve is sized to accept a 23mm annuloplasty ring based upon the distance between the left and right commissures, the height and the surface area of the anterior leaflet.  A Medtronic Simuform annuloplasty ring (size 18mm, model # [id number], serial # [id number]) is implanted uneventfully.  All ring sutures were secured using a Cor-knot device.  The valve is again tested with saline and appears to be perfectly competent with a broad symmetrical line of coaptation of the anterior and posterior leaflet. There is no residual leak. Rewarming is begun.  The left atriotomy incision is closed after placing a sump drain across the mitral valve to serve as a left ventricular vent.   Procedure Completion:  Epicardial pacing wires are fixed to the inferior wall of the right ventricule and to the right atrial appendage. The patient is rewarmed to 37C temperature. The left ventricular vent and antegrade cardioplegia cannula are removed. The patient is weaned and disconnected from cardiopulmonary bypass.  The patient's rhythm at separation from bypass was sinus.  The patient was weaned from bypass without any inotropic support. Total cardiopulmonary bypass time for the operation was 101 minutes.  Followup transesophageal echocardiogram performed after separation from bypass revealed a well-seated annuloplasty ring in the mitral position with a normal functioning mitral valve. There was no residual leak.  Left ventricular function was unchanged from preoperatively.  The femoral arterial and venous cannulas were removed and all Perclose sutures secured.  Manual pressure was maintained while Protamine was administered.  The right internal jugular cannula was removed and manual pressure held on the neck and groin for 15 minutes.  Single lung ventilation was begun. The atriotomy  closure was inspected for hemostasis. The pericardial sac was drained using a 28 French Bard drain placed through the anterior port incision.  The right pleural space is irrigated with saline solution and inspected for hemostasis.   A mixture of Exparel liposomal bupivacaine (20 mL) and 0.5% bupivacaine (30 mL) is utilized to create an intercostal nerve block for postoperative analgesia.  The mixture is injected under direct vision into the intercostal neurovascular bundles posteriorly to cover the second through the sixth intercostal nerve roots.  Portions of the solution are also injected into the intercostal neurovascular bundles immediately surrounding the surgical incision and immediately adjacent to the chest tube exit sites.  The right pleural space was drained using a 28 French Bard drain placed through the posterior port incision. The miniature thoracotomy incision was closed in multiple layers in routine fashion. The right groin incision was inspected for hemostasis and closed in multiple layers in routine fashion.  The post-bypass portion of the operation was notable for stable rhythm and hemodynamics.  No blood products were administered during the operation.   Disposition:  The patient tolerated the procedure well.  The patient was extubated in the operating room and subsequently transported to the surgical intensive care unit in stable condition. There were no intraoperative complications. All sponge instrument and needle counts are verified correct at completion of the operation.    Salvatore Decent. Cornelius Moras MD  03/13/2021 1:21 PM

## 2021-03-13 NOTE — Anesthesia Preprocedure Evaluation (Addendum)
Anesthesia Evaluation  Patient identified by MRN, date of birth, ID band Patient awake    Reviewed: Allergy & Precautions, NPO status , Patient's Chart, lab work & pertinent test results  History of Anesthesia Complications Negative for: history of anesthetic complications  Airway Mallampati: III  TM Distance: <3 FB Neck ROM: Full    Dental  (+) Dental Advisory Given   Pulmonary neg pulmonary ROS, neg shortness of breath, neg sleep apnea, neg COPD, neg recent URI,    breath sounds clear to auscultation       Cardiovascular + dysrhythmias + Valvular Problems/Murmurs MR  Rhythm:Regular  Thoracic aortic aneurysm ([place])  1. Flail P2 segement of posterior MV leaflet with severe eccentric MR.  2. Left ventricular ejection fraction, by estimation, is 60 to 65%. The  left ventricle has normal function.  3. Right ventricular systolic function is normal. The right ventricular  size is normal.  4. Left atrial size was moderately dilated. No left atrial/left atrial  appendage thrombus was detected.  5. The mitral valve is abnormal. Severe mitral valve regurgitation. There  is severe holosystolic prolapse of the middle scallop of the posterior  leaflet of the mitral valve.  6. The aortic valve is tricuspid. Aortic valve regurgitation is not  visualized.  7. Aortic dilatation noted. There is mild dilatation of the aortic root,  measuring 44 mm. There is mild dilatation of the ascending aorta,  measuring 42 mm.    Neuro/Psych PSYCHIATRIC DISORDERS Anxiety negative neurological ROS     GI/Hepatic negative GI ROS, Neg liver ROS,   Endo/Other  negative endocrine ROS  Renal/GU negative Renal ROS     Musculoskeletal negative musculoskeletal ROS (+)   Abdominal   Peds  Hematology negative hematology ROS (+)   Anesthesia Other Findings   Reproductive/Obstetrics                                                               Anesthesia Evaluation  Patient identified by MRN, date of birth, ID band Patient awake    Reviewed: Allergy & Precautions, NPO status , Patient's Chart, lab work & pertinent test results  Airway Mallampati: III  TM Distance: >3 FB Neck ROM: Full    Dental no notable dental hx.    Pulmonary neg pulmonary ROS,    Pulmonary exam normal breath sounds clear to auscultation       Cardiovascular Normal cardiovascular exam+ dysrhythmias Atrial Fibrillation + Valvular Problems/Murmurs MR  Rhythm:Regular Rate:Normal     Neuro/Psych Anxiety negative neurological ROS     GI/Hepatic negative GI ROS, Neg liver ROS,   Endo/Other  negative endocrine ROS  Renal/GU negative Renal ROS     Musculoskeletal negative musculoskeletal ROS (+)   Abdominal   Peds  Hematology negative hematology ROS (+)   Anesthesia Other Findings MITRAL VALVE DISORDER  Reproductive/Obstetrics                             Anesthesia Physical Anesthesia Plan  ASA: II  Anesthesia Plan: MAC   Post-op Pain Management:    Induction: Intravenous  PONV Risk Score and Plan: 1 and Propofol infusion and Treatment may vary due to age or medical condition  Airway Management Planned: Nasal Cannula  Additional Equipment:   Intra-op Plan:   Post-operative Plan:   Informed Consent: I have reviewed the patients History and Physical, chart, labs and discussed the procedure including the risks, benefits and alternatives for the proposed anesthesia with the patient or authorized representative who has indicated his/her understanding and acceptance.     Dental advisory given  Plan Discussed with: CRNA  Anesthesia Plan Comments:         Anesthesia Quick Evaluation  Anesthesia Physical Anesthesia Plan  ASA: 4  Anesthesia Plan: General   Post-op Pain Management:    Induction: Intravenous  PONV Risk Score and Plan: 2 and Treatment may  vary due to age or medical condition  Airway Management Planned: Double Lumen EBT  Additional Equipment: Arterial line, CVP, PA Cath, TEE and Ultrasound Guidance Line Placement  Intra-op Plan:   Post-operative Plan: Possible Post-op intubation/ventilation  Informed Consent: I have reviewed the patients History and Physical, chart, labs and discussed the procedure including the risks, benefits and alternatives for the proposed anesthesia with the patient or authorized representative who has indicated his/her understanding and acceptance.     Dental advisory given  Plan Discussed with: CRNA and Surgeon  Anesthesia Plan Comments:         Anesthesia Quick Evaluation

## 2021-03-13 NOTE — Brief Op Note (Addendum)
03/13/2021  12:30 PM  PATIENT:  Johnny [name]  65 y.o. male  PRE-OPERATIVE DIAGNOSIS:  MR, Afib  POST-OPERATIVE DIAGNOSIS:  MR, Afib  PROCEDURES:   MINIMALLY INVASIVE MITRAL VALVE REPAIR USING MEDTRONIC SIMUFORM RING SIZE  and PTFE Neo-Chords x 2  LEFT-SIDE CRYO MAZE   TRANSESOPHAGEAL ECHOCARDIOGRAM   CLIPPING OF ATRIAL APPENDAGE  USING ATRICURE CLIP PRO2 SIZE  SURGEON: Purcell Nails, MD   PHYSICIAN ASSISTANT: Roddenberry  ASSISTANTS: Sharon Mt, RNFA  ANESTHESIA:   general  EBL:  per anesthesia and perfusion records     BLOOD ADMINISTERED:none  DRAINS:  right pleural and mediastinal records    LOCAL MEDICATIONS USED:  NONE  SPECIMEN:  No Specimen  DISPOSITION OF SPECIMEN:  N/A  COUNTS:  YES  DICTATION: .Dragon Dictation  PLAN OF CARE: Admit to inpatient   PATIENT DISPOSITION:  ICU - extubated and stable.   Delay start of Pharmacological VTE agent (>24hrs) due to surgical blood loss or risk of bleeding: yes

## 2021-03-13 NOTE — Progress Notes (Signed)
      [address]       [place] [address]             [phone]      S/p mitral repair, Maze  Extubated BP 133/74   Pulse 76   Temp 97.7 F (36.5 C) (Oral)   Resp (!) 27   Ht 6\' 2"  (1.88 m)   Wt 105.2 kg   SpO2 96%   BMI 29.78 kg/m  31/19, CI= 3.1  Intake/Output Summary (Last 24 hours) at 03/13/2021 1646 Last data filed at 03/13/2021 1350 Gross per 24 hour  Intake 4800 ml  Output 4003 ml  Net 797 ml   Hct= 41, PLT 93K K= 4.1 Swan wedging intermittently- pulled back 2 cm  Doing well early postop  03/15/2021 C. Viviann Spare, MD [facility] [phone]

## 2021-03-13 NOTE — Anesthesia Procedure Notes (Signed)
Central Venous Catheter Insertion Performed by: Cecile Hearing, MD, anesthesiologist Start/End6/27/2022 6:57 AM, 03/13/2021 7:02 AM Patient location: Pre-op. Preanesthetic checklist: patient identified, IV checked, site marked, risks and benefits discussed, surgical consent, monitors and equipment checked, pre-op evaluation and timeout performed Position: Trendelenburg Hand hygiene performed  and maximum sterile barriers used  Total catheter length 100. PA cath was placed.Swan type:thermodilution Procedure performed without using ultrasound guided technique. Attempts: 1 Patient tolerated the procedure well with no immediate complications.

## 2021-03-13 NOTE — Progress Notes (Signed)
  Echocardiogram Echocardiogram Transesophageal has been performed.  Johnny [name] 03/13/2021, 11:28 AM

## 2021-03-13 NOTE — Anesthesia Procedure Notes (Signed)
Procedure Name: Intubation Date/Time: 03/13/2021 8:10 AM Performed by: Lowella Dell, CRNA Pre-anesthesia Checklist: Patient identified, Emergency Drugs available, Suction available, Patient being monitored and Timeout performed Patient Re-evaluated:Patient Re-evaluated prior to induction Oxygen Delivery Method: Circle system utilized Preoxygenation: Pre-oxygenation with 100% oxygen Induction Type: IV induction Ventilation: Two handed mask ventilation required and Oral airway inserted - appropriate to patient size Laryngoscope Size: Mac and 4 Grade View: Grade III Tube type: Oral Endobronchial tube: Left, EBT position confirmed by auscultation, EBT position confirmed by fiberoptic bronchoscope and Double lumen EBT and 41 Fr Placement Confirmation: ETT inserted through vocal cords under direct vision, positive ETCO2 and breath sounds checked- equal and bilateral Secured at: 30 cm Tube secured with: Tape Dental Injury: Teeth and Oropharynx as per pre-operative assessment

## 2021-03-13 NOTE — Anesthesia Procedure Notes (Signed)
Central Venous Catheter Insertion Performed by: Cecile Hearing, MD, anesthesiologist Start/End6/27/2022 6:47 AM, 03/13/2021 6:57 AM Patient location: Pre-op. Preanesthetic checklist: patient identified, IV checked, site marked, risks and benefits discussed, surgical consent, monitors and equipment checked, pre-op evaluation, timeout performed and anesthesia consent Position: Trendelenburg Lidocaine 1% used for infiltration and patient sedated Hand hygiene performed , maximum sterile barriers used  and Seldinger technique used Catheter size: 8.5 Fr Central line was placed.Sheath introducer Procedure performed using ultrasound guided technique. Ultrasound Notes:anatomy identified, needle tip was noted to be adjacent to the nerve/plexus identified, no ultrasound evidence of intravascular and/or intraneural injection and image(s) printed for medical record Attempts: 1 Following insertion, line sutured, dressing applied and Biopatch. Post procedure assessment: free fluid flow, blood return through all ports and no air  Patient tolerated the procedure well with no immediate complications.

## 2021-03-14 ENCOUNTER — Encounter (HOSPITAL_COMMUNITY): Payer: Self-pay | Admitting: Thoracic Surgery (Cardiothoracic Vascular Surgery)

## 2021-03-14 ENCOUNTER — Other Ambulatory Visit: Payer: Self-pay

## 2021-03-14 ENCOUNTER — Inpatient Hospital Stay (HOSPITAL_COMMUNITY): Payer: Medicare Other

## 2021-03-14 DIAGNOSIS — Z9889 Other specified postprocedural states: Secondary | ICD-10-CM

## 2021-03-14 DIAGNOSIS — I340 Nonrheumatic mitral (valve) insufficiency: Secondary | ICD-10-CM

## 2021-03-14 LAB — CBC
HCT: 37.1 % — ABNORMAL LOW (ref 39.0–52.0)
HCT: 37.2 % — ABNORMAL LOW (ref 39.0–52.0)
Hemoglobin: 12.2 g/dL — ABNORMAL LOW (ref 13.0–17.0)
Hemoglobin: 12.1 g/dL — ABNORMAL LOW (ref 13.0–17.0)
MCH: 31.1 pg (ref 26.0–34.0)
MCH: 31.2 pg (ref 26.0–34.0)
MCHC: 32.9 g/dL (ref 30.0–36.0)
MCHC: 32.5 g/dL (ref 30.0–36.0)
MCV: 94.6 fL (ref 80.0–100.0)
MCV: 95.9 fL (ref 80.0–100.0)
Platelets: 98 10*3/uL — ABNORMAL LOW (ref 150–400)
Platelets: 88 10*3/uL — ABNORMAL LOW (ref 150–400)
RBC: 3.92 MIL/uL — ABNORMAL LOW (ref 4.22–5.81)
RBC: 3.88 MIL/uL — ABNORMAL LOW (ref 4.22–5.81)
RDW: 13.8 % (ref 11.5–15.5)
RDW: 14 % (ref 11.5–15.5)
WBC: 9.2 10*3/uL (ref 4.0–10.5)
WBC: 10.4 10*3/uL (ref 4.0–10.5)
nRBC: 0 % (ref 0.0–0.2)
nRBC: 0 % (ref 0.0–0.2)

## 2021-03-14 LAB — BASIC METABOLIC PANEL
Anion gap: 6 (ref 5–15)
Anion gap: 6 (ref 5–15)
BUN: 13 mg/dL (ref 8–23)
BUN: 13 mg/dL (ref 8–23)
CO2: 21 mmol/L — ABNORMAL LOW (ref 22–32)
CO2: 23 mmol/L (ref 22–32)
Calcium: 7.7 mg/dL — ABNORMAL LOW (ref 8.9–10.3)
Calcium: 8 mg/dL — ABNORMAL LOW (ref 8.9–10.3)
Chloride: 107 mmol/L (ref 98–111)
Chloride: 103 mmol/L (ref 98–111)
Creatinine, Ser: 0.91 mg/dL (ref 0.61–1.24)
Creatinine, Ser: 0.88 mg/dL (ref 0.61–1.24)
GFR, Estimated: >60 mL/min (ref >60)
GFR, Estimated: >60 mL/min (ref >60)
Glucose, Bld: 120 mg/dL — ABNORMAL HIGH (ref 70–99)
Glucose, Bld: 124 mg/dL — ABNORMAL HIGH (ref 70–99)
Potassium: 4.4 mmol/L (ref 3.5–5.1)
Potassium: 4.1 mmol/L (ref 3.5–5.1)
Sodium: 134 mmol/L — ABNORMAL LOW (ref 135–145)
Sodium: 132 mmol/L — ABNORMAL LOW (ref 135–145)

## 2021-03-14 LAB — MAGNESIUM
Magnesium: 2.4 mg/dL (ref 1.7–2.4)
Magnesium: 2.2 mg/dL (ref 1.7–2.4)

## 2021-03-14 LAB — GLUCOSE, CAPILLARY
Glucose-Capillary: 128 mg/dL — ABNORMAL HIGH (ref 70–99)
Glucose-Capillary: 115 mg/dL — ABNORMAL HIGH (ref 70–99)
Glucose-Capillary: 116 mg/dL — ABNORMAL HIGH (ref 70–99)
Glucose-Capillary: 107 mg/dL — ABNORMAL HIGH (ref 70–99)
Glucose-Capillary: 109 mg/dL — ABNORMAL HIGH (ref 70–99)
Glucose-Capillary: 128 mg/dL — ABNORMAL HIGH (ref 70–99)

## 2021-03-14 MED ORDER — HYDROCORTISONE 1 % EX CREA
TOPICAL_CREAM | Freq: Three times a day (TID) | CUTANEOUS | Status: DC | PRN
  Filled 2021-03-14 (×2): qty 28

## 2021-03-14 MED ORDER — WARFARIN SODIUM 2.5 MG PO TABS
2.5000 mg | ORAL_TABLET | Freq: Every day | ORAL | Status: DC
  Filled 2021-03-14: qty 1

## 2021-03-14 MED ORDER — ENOXAPARIN SODIUM 40 MG/0.4ML IJ SOSY
40.0000 mg | PREFILLED_SYRINGE | Freq: Every day | INTRAMUSCULAR | Status: DC
  Administered 2021-03-15 – 2021-03-16 (×2): 40 mg via SUBCUTANEOUS
  Filled 2021-03-14 (×2): qty 0.4

## 2021-03-14 MED ORDER — KETOROLAC TROMETHAMINE 15 MG/ML IJ SOLN
15.0000 mg | Freq: Four times a day (QID) | INTRAMUSCULAR | Status: AC
  Administered 2021-03-14 – 2021-03-15 (×5): 15 mg via INTRAVENOUS
  Filled 2021-03-14 (×5): qty 1

## 2021-03-14 MED ORDER — INSULIN ASPART 100 UNIT/ML IJ SOLN
0.0000 [IU] | INTRAMUSCULAR | Status: DC
  Administered 2021-03-14: 2 [IU] via SUBCUTANEOUS

## 2021-03-14 MED ORDER — WARFARIN - PHYSICIAN DOSING INPATIENT: Freq: Every day | Status: DC

## 2021-03-14 MED ORDER — METOPROLOL TARTRATE 12.5 MG HALF TABLET
12.5000 mg | ORAL_TABLET | Freq: Two times a day (BID) | ORAL | Status: DC
  Administered 2021-03-14 – 2021-03-16 (×5): 12.5 mg via ORAL
  Filled 2021-03-14 (×5): qty 1

## 2021-03-14 MED ORDER — FUROSEMIDE 10 MG/ML IJ SOLN
20.0000 mg | Freq: Two times a day (BID) | INTRAMUSCULAR | Status: DC
  Administered 2021-03-14: 20 mg via INTRAVENOUS
  Filled 2021-03-14: qty 2

## 2021-03-14 MED FILL — Glutaral Soln 25%: TOPICAL | Qty: 50 | Status: AC

## 2021-03-14 MED FILL — Heparin Sodium (Porcine) Inj 1000 Unit/ML: INTRAMUSCULAR | Qty: 30 | Status: AC

## 2021-03-14 MED FILL — Lidocaine HCl Local Preservative Free (PF) Inj 2%: INTRAMUSCULAR | Qty: 15 | Status: AC

## 2021-03-14 MED FILL — Potassium Chloride Inj 2 mEq/ML: INTRAVENOUS | Qty: 40 | Status: AC

## 2021-03-14 NOTE — Progress Notes (Addendum)
TCTS DAILY ICU PROGRESS NOTE                   [address]            [name] [address]          [phone]   1 Day Post-Op Procedure(s) (LRB): MINIMALLY INVASIVE MITRAL VALVE REPAIR (MVR) USING MEDTRONIC SIMUFORM RING SIZE (Right) MAZE (N/A) TRANSESOPHAGEAL ECHOCARDIOGRAM (TEE) (N/A) CLIPPING OF ATRIAL APPENDAGE  USING ATRICURE CLIP PRO2 SIZE  Total Length of Stay:  LOS: 1 day   Subjective: Awake and alert and reasonably comfortable.  Extubated in the OR prior to transfer to ICU.  No events overnight. On low-dose Neo-Synephrine to support blood pressure  Objective: Vital signs in last 24 hours: Temp:  [96.1 F (35.6 C)-98 F (36.7 C)] 97.9 F (36.6 C) (06/28 0400) Pulse Rate:  [83-96] 89 (06/28 0648) Resp:  [11-30] 29 (06/28 0648) BP: (74-112)/(50-80) 95/65 (06/28 0500) SpO2:  [91 %-99 %] 92 % (06/28 0648) Arterial Line BP: (73-120)/(49-72) 98/53 (06/28 4259) Weight:  [111.6 kg] 111.6 kg (06/28 0530)  Filed Weights   03/13/21 0608 03/14/21 0530  Weight: 105.2 kg 111.6 kg    Weight change: 6.385 kg   Hemodynamic parameters for last 24 hours: PAP: (18-39)/(6-30) 29/15 CO:  [5.6 L/min-7.2 L/min] 7.2 L/min CI:  [2.4 L/min/m2-3.1 L/min/m2] 3.1 L/min/m2  Intake/Output from previous day: 06/27 0701 - 06/28 0700 In: 7531.8 [I.V.:5303.8; Blood:800; IV Piggyback:1428] Out: 6858 [Urine:2795; Blood:1000; Chest Tube:1010]  Intake/Output this shift: No intake/output data recorded.  Current Meds: Scheduled Meds:  acetaminophen  1,000 mg Oral Q6H   aspirin EC  325 mg Oral Daily   bisacodyl  10 mg Oral Daily   Or   bisacodyl  10 mg Rectal Daily   Chlorhexidine Gluconate Cloth  6 each Topical Daily   docusate sodium  200 mg Oral Daily   [START ON 03/15/2021] enoxaparin (LOVENOX) injection  40 mg Subcutaneous QHS   insulin aspart  0-24 Units Subcutaneous Q4H   [START ON 03/15/2021] pantoprazole  40 mg Oral Daily   sodium chloride flush  3 mL  Intravenous Q12H   white petrolatum       Continuous Infusions:  sodium chloride     albumin human Stopped (03/14/21 0647)    ceFAZolin (ANCEF) IV 200 mL/hr at 03/14/21 0700   famotidine (PEPCID) IV     lactated ringers     phenylephrine (NEO-SYNEPHRINE) Adult infusion 30 mcg/min (03/14/21 0700)   PRN Meds:.albumin human, lip balm, metoprolol tartrate, morphine injection, ondansetron (ZOFRAN) IV, oxyCODONE, sodium chloride flush, traMADol  General appearance: alert, cooperative, and no distress Neurologic: intact Heart: Sinus rhythm, frequent PVCs.  No murmur. Lungs: Breath sounds are clear bilaterally, diminished right base.  Chest x-ray does show some volume loss in the right base and a small effusion on the left.  Chest tube drainage about 1 L since surgery Abdomen: The abdomen is soft and nontender Extremities: All extremities well perfused.  The right femoral access sites are soft and dry. Wound: The right chest incision is covered with a dry dressing.  Lab Results: CBC: Recent Labs    03/13/21 2006 03/14/21 0439  WBC 10.2 9.2  HGB 13.2 12.2*  HCT 40.6 37.1*  PLT 96* 98*   BMET:  Recent Labs    03/13/21 2006 03/14/21 0439  NA 136 134*  K 4.8 4.4  CL 111 107  CO2 21* 21*  GLUCOSE 117* 120*  BUN 14 13  CREATININE 0.90 0.91  CALCIUM 7.7* 7.7*    CMET: Lab Results  Component Value Date   WBC 9.2 03/14/2021   HGB 12.2 (L) 03/14/2021   HCT 37.1 (L) 03/14/2021   PLT 98 (L) 03/14/2021   GLUCOSE 120 (H) 03/14/2021   CHOL 165 04/13/2020   TRIG 66 10/26/2020   HDL 37 (L) 04/13/2020   LDLCALC 115 (H) 04/13/2020   ALT 16 03/10/2021   AST 35 03/10/2021   NA 134 (L) 03/14/2021   K 4.4 03/14/2021   CL 107 03/14/2021   CREATININE 0.91 03/14/2021   BUN 13 03/14/2021   CO2 21 (L) 03/14/2021   TSH 0.645 10/26/2020   INR 1.4 (H) 03/13/2021   HGBA1C 6.0 (H) 03/10/2021      PT/INR:  Recent Labs    03/13/21 1520  LABPROT 16.9*  INR 1.4*   Radiology: [facility]  Chest Port 1 View  Result Date: 03/14/2021 CLINICAL DATA:  Chest tube.  MVR. EXAM: PORTABLE CHEST 1 VIEW COMPARISON:  03/13/2021. FINDINGS: Interim removal Swan-Ganz catheter. Left IJ sheath in stable position. Right chest tube in stable position. Prior cardiac valve replacement. Left atrial appendage clip in stable position. Cardiomegaly. Pulmonary venous congestion noted on today's exam. Bibasilar atelectasis and infiltrates/edema. Findings progressed from prior exam. Small left pleural effusion noted on today's exam. No pneumothorax. IMPRESSION: 1. Interim removal Swan-Ganz catheter. Left IJ sheath in stable position. Right chest tube in stable position. No pneumothorax. 2. Prior cardiac valve replacement. Left atrial appendage clip in stable position. Cardiomegaly. Pulmonary venous congestion noted on today's exam. 3. Low lung volumes with bibasilar atelectasis and infiltrates/edema, progressed from prior exam. Small left pleural effusion noted on today's exam. Electronically Signed   By: Maisie Fus  Register   On: 03/14/2021 06:46   DG Chest Port 1 View  Result Date: 03/13/2021 CLINICAL DATA:  Status post mitral valve repair EXAM: PORTABLE CHEST 1 VIEW COMPARISON:  03/10/2021 FINDINGS: Cardiac shadow is enlarged. Mitral valve repair is noted with Maze procedure. Swan-Ganz catheter is noted in the right pulmonary artery. No pneumothorax is seen. Right basilar atelectasis is noted and elevation of the right hemidiaphragm. Right chest tube is noted. No pneumothorax is seen. Mild vascular congestion is noted centrally. IMPRESSION: Tubes and lines as described above. No pneumothorax is noted. Postsurgical changes as described. Right basilar atelectasis. Electronically Signed   By: Alcide Clever M.D.   On: 03/13/2021 16:05     Assessment/Plan: S/P Procedure(s) (LRB): MINIMALLY INVASIVE MITRAL VALVE REPAIR (MVR) USING MEDTRONIC SIMUFORM RING SIZE (Right) MAZE (N/A) TRANSESOPHAGEAL ECHOCARDIOGRAM (TEE)  (N/A) CLIPPING OF ATRIAL APPENDAGE  USING ATRICURE CLIP PRO2 SIZE  -Postop day 1 minimally invasive mitral valve repair for severe MR.  Hemodynamic stable.  DC the PA catheter and arterial line.  Leave the chest tubes for drainage.  Weaning the Neo-Synephrine now.  Mobilize.  No Coumadin for now, plan to resume his Eliquis at discharge  -Remote history of atrial fibrillation-status post left side cryo-MAZE and left atrial clip application.   -Expected postoperative volume excess-weight is up about 6 kg.  We will hold off on aggressive diuresis until he is off the Neo-Synephrine  -DVT prophylaxis-begin enoxaparin later today.  Leary Roca, PA-C [phone] 03/14/2021 8:14 AM   I have seen and examined the patient and agree with the assessment and plan as outlined.  Doing well POD1.  Mobilize.  Hold diuretics until BP increases.  D/C lines.  Will plan to resume  Eliquis at hospital d/c and stop aspirin at that time.  Purcell Nails, MD 03/14/2021 2:01 PM

## 2021-03-14 NOTE — Plan of Care (Signed)

## 2021-03-14 NOTE — Transfer of Care (Signed)
Immediate Anesthesia Transfer of Care Note  Patient: [name]  Procedure(s) Performed: MINIMALLY INVASIVE MITRAL VALVE REPAIR (MVR) USING MEDTRONIC SIMUFORM RING SIZE (Right: Chest) MAZE TRANSESOPHAGEAL ECHOCARDIOGRAM (TEE) CLIPPING OF ATRIAL APPENDAGE  USING ATRICURE CLIP PRO2 SIZE (Chest)  Patient Location: SICU  Anesthesia Type:General  Level of Consciousness: awake, drowsy and patient cooperative  Airway & Oxygen Therapy: Patient Spontanous Breathing and Patient connected to face mask oxygen  Post-op Assessment: Report given to RN, Post -op Vital signs reviewed and stable and Patient moving all extremities  Post vital signs: Reviewed and stable  Last Vitals:  Vitals Value Taken Time  BP 90/58 03/14/21 0700  Temp 36.6 C 03/14/21 0400  Pulse 89 03/14/21 0758  Resp 19 03/14/21 0758  SpO2 90 % 03/14/21 0758  Vitals shown include unvalidated device data.  Last Pain:  Vitals:   03/14/21 0648  TempSrc:   PainSc: 6       Patients Stated Pain Goal: 0 (03/13/21 1730)  Complications: No notable events documented.

## 2021-03-14 NOTE — Progress Notes (Signed)
TCTS BRIEF SICU PROGRESS NOTE  1 Day Post-Op  S/P Procedure(s) (LRB): MINIMALLY INVASIVE MITRAL VALVE REPAIR (MVR) USING MEDTRONIC SIMUFORM RING SIZE (Right) MAZE (N/A) TRANSESOPHAGEAL ECHOCARDIOGRAM (TEE) (N/A) CLIPPING OF ATRIAL APPENDAGE  USING ATRICURE CLIP PRO2 SIZE   Stable day NSR w/ stable BP off Neo  Plan: Continue current plan  Purcell Nails, MD 03/14/2021 5:51 PM

## 2021-03-15 ENCOUNTER — Inpatient Hospital Stay (HOSPITAL_COMMUNITY): Payer: Medicare Other

## 2021-03-15 LAB — GLUCOSE, CAPILLARY
Glucose-Capillary: 94 mg/dL (ref 70–99)
Glucose-Capillary: 117 mg/dL — ABNORMAL HIGH (ref 70–99)
Glucose-Capillary: 131 mg/dL — ABNORMAL HIGH (ref 70–99)
Glucose-Capillary: 103 mg/dL — ABNORMAL HIGH (ref 70–99)
Glucose-Capillary: 110 mg/dL — ABNORMAL HIGH (ref 70–99)
Glucose-Capillary: 115 mg/dL — ABNORMAL HIGH (ref 70–99)

## 2021-03-15 LAB — CBC
HCT: 36.1 % — ABNORMAL LOW (ref 39.0–52.0)
Hemoglobin: 11.9 g/dL — ABNORMAL LOW (ref 13.0–17.0)
MCH: 31.4 pg (ref 26.0–34.0)
MCHC: 33 g/dL (ref 30.0–36.0)
MCV: 95.3 fL (ref 80.0–100.0)
Platelets: 76 10*3/uL — ABNORMAL LOW (ref 150–400)
RBC: 3.79 MIL/uL — ABNORMAL LOW (ref 4.22–5.81)
RDW: 13.8 % (ref 11.5–15.5)
WBC: 9.5 10*3/uL (ref 4.0–10.5)
nRBC: 0 % (ref 0.0–0.2)

## 2021-03-15 LAB — BASIC METABOLIC PANEL
Anion gap: 5 (ref 5–15)
BUN: 13 mg/dL (ref 8–23)
CO2: 24 mmol/L (ref 22–32)
Calcium: 8.1 mg/dL — ABNORMAL LOW (ref 8.9–10.3)
Chloride: 102 mmol/L (ref 98–111)
Creatinine, Ser: 1.02 mg/dL (ref 0.61–1.24)
GFR, Estimated: >60 mL/min (ref >60)
Glucose, Bld: 104 mg/dL — ABNORMAL HIGH (ref 70–99)
Potassium: 4.1 mmol/L (ref 3.5–5.1)
Sodium: 131 mmol/L — ABNORMAL LOW (ref 135–145)

## 2021-03-15 LAB — PROTIME-INR
INR: 1.3 — ABNORMAL HIGH (ref 0.8–1.2)
Prothrombin Time: 16.3 seconds — ABNORMAL HIGH (ref 11.4–15.2)

## 2021-03-15 MED ORDER — POTASSIUM CHLORIDE 10 MEQ/50ML IV SOLN
10.0000 meq | INTRAVENOUS | Status: AC
  Administered 2021-03-15 (×2): 10 meq via INTRAVENOUS
  Filled 2021-03-15 (×2): qty 50

## 2021-03-15 MED ORDER — FUROSEMIDE 10 MG/ML IJ SOLN
40.0000 mg | Freq: Two times a day (BID) | INTRAMUSCULAR | Status: AC
  Administered 2021-03-15 (×2): 40 mg via INTRAVENOUS
  Filled 2021-03-15 (×2): qty 4

## 2021-03-15 MED ORDER — ~~LOC~~ CARDIAC SURGERY, PATIENT & FAMILY EDUCATION: Freq: Once | Status: DC

## 2021-03-15 NOTE — Progress Notes (Addendum)
TCTS DAILY ICU PROGRESS NOTE                   [address]            [name] [address]          [phone]   2 Days Post-Op Procedure(s) (LRB): MINIMALLY INVASIVE MITRAL VALVE REPAIR (MVR) USING MEDTRONIC SIMUFORM RING SIZE (Right) MAZE (N/A) TRANSESOPHAGEAL ECHOCARDIOGRAM (TEE) (N/A) CLIPPING OF ATRIAL APPENDAGE  USING ATRICURE CLIP PRO2 SIZE  Total Length of Stay:  LOS: 2 days   Subjective: Awake and alert , pain control better with addition of Toradol yesterday.  No events overnight. BP stable off Neo.  Objective: Vital signs in last 24 hours: Temp:  [97.9 F (36.6 C)-98.6 F (37 C)] 97.9 F (36.6 C) (06/29 0604) Pulse Rate:  [88-112] 108 (06/29 0604) Cardiac Rhythm: Sinus tachycardia (06/28 2000) Resp:  [9-29] 26 (06/29 0604) BP: (96-136)/(67-90) 115/76 (06/29 0500) SpO2:  [88 %-95 %] 91 % (06/29 0604) Arterial Line BP: (84-142)/(49-66) 93/64 (06/28 1245) Weight:  [111.6 kg] 111.6 kg (06/29 0604)  Filed Weights   03/13/21 0608 03/14/21 0530 03/15/21 0604  Weight: 105.2 kg 111.6 kg 111.6 kg    Weight change: 0.015 kg     Intake/Output from previous day: 06/28 0701 - 06/29 0700 In: 1082.7 [P.O.:720; I.V.:52.5; IV Piggyback:310.1] Out: 1655 [Urine:885; Chest Tube:770]  Intake/Output this shift: No intake/output data recorded.  Current Meds: Scheduled Meds:  acetaminophen  1,000 mg Oral Q6H   aspirin EC  325 mg Oral Daily   bisacodyl  10 mg Oral Daily   Or   bisacodyl  10 mg Rectal Daily   Chlorhexidine Gluconate Cloth  6 each Topical Daily   [facility] Family Education   Does not apply Once   docusate sodium  200 mg Oral Daily   enoxaparin (LOVENOX) injection  40 mg Subcutaneous QHS   furosemide  40 mg Intravenous BID   ketorolac  15 mg Intravenous Q6H   metoprolol tartrate  12.5 mg Oral BID   pantoprazole  40 mg Oral Daily   sodium chloride flush  3 mL Intravenous Q12H   Warfarin - Physician  Dosing Inpatient   Does not apply q1600   Continuous Infusions:  sodium chloride      ceFAZolin (ANCEF) IV 2 g (03/15/21 4944)   lactated ringers     potassium chloride 10 mEq (03/15/21 0706)   PRN Meds:.hydrocortisone cream, lip balm, metoprolol tartrate, morphine injection, ondansetron (ZOFRAN) IV, oxyCODONE, sodium chloride flush, traMADol  General appearance: alert, cooperative, and no distress Neurologic: intact Heart: Sinus rhythm / Sinus tach.  No murmur. Lungs: Breath sounds are clear bilaterally, diminished right base.  Chest x-ray stable.   Chest tube drainage past 24 hours.  Abdomen: The abdomen is soft and nontender Extremities: All extremities well perfused.   Wound: The right chest incision is covered with a dry Aquacel dressing.  Lab Results: CBC: Recent Labs    03/14/21 1700 03/15/21 0513  WBC 10.4 9.5  HGB 12.1* 11.9*  HCT 37.2* 36.1*  PLT 88* 76*    BMET:  Recent Labs    03/14/21 1700 03/15/21 0513  NA 132* 131*  K 4.1 4.1  CL 103 102  CO2 23 24  GLUCOSE 124* 104*  BUN 13 13  CREATININE 0.88 1.02  CALCIUM 8.0* 8.1*     CMET: Lab Results  Component Value Date   WBC  9.5 03/15/2021   HGB 11.9 (L) 03/15/2021   HCT 36.1 (L) 03/15/2021   PLT 76 (L) 03/15/2021   GLUCOSE 104 (H) 03/15/2021   CHOL 165 04/13/2020   TRIG 66 10/26/2020   HDL 37 (L) 04/13/2020   LDLCALC 115 (H) 04/13/2020   ALT 16 03/10/2021   AST 35 03/10/2021   NA 131 (L) 03/15/2021   K 4.1 03/15/2021   CL 102 03/15/2021   CREATININE 1.02 03/15/2021   BUN 13 03/15/2021   CO2 24 03/15/2021   TSH 0.645 10/26/2020   INR 1.3 (H) 03/15/2021   HGBA1C 6.0 (H) 03/10/2021      PT/INR:  Recent Labs    03/15/21 0513  LABPROT 16.3*  INR 1.3*    Radiology: [facility] Chest Port 1 View  Result Date: 03/15/2021 CLINICAL DATA:  Chest tube placement EXAM: PORTABLE CHEST 1 VIEW COMPARISON:  03/14/2021 FINDINGS: Dual right chest tubes are unchanged. Lung volumes are small, stable  when compared to prior examination. Bibasilar atelectasis persists. No pneumothorax. Tiny bilateral pleural effusions are present. Mild elevation of the right hemidiaphragm. Mitral valve replacement has been performed. Cardiac size is within normal limits. Left internal jugular Cordis introducer is unchanged. IMPRESSION: Stable pulmonary hypoinflation with elevation of the right hemidiaphragm. Dual right chest tubes in place.  No pneumothorax. Stable bilateral tiny pleural effusions. Electronically Signed   By: Helyn Numbers MD   On: 03/15/2021 06:04     Assessment/Plan: S/P Procedure(s) (LRB): MINIMALLY INVASIVE MITRAL VALVE REPAIR (MVR) USING MEDTRONIC SIMUFORM RING SIZE (Right) MAZE (N/A) TRANSESOPHAGEAL ECHOCARDIOGRAM (TEE) (N/A) CLIPPING OF ATRIAL APPENDAGE  USING ATRICURE CLIP PRO2 SIZE  -Postop day 2 minimally invasive mitral valve repair for severe MR.  VS and cardiac rhythm stable.  DC the Cordis. Leave the chest tubes for drainage.  Begin diuresis.  Advance activity.  No Coumadin,  plan to resume his Eliquis at discharge. Keep in ICU.  -Remote history of atrial fibrillation-status post left side cryo-MAZE and left atrial clip application. S. Tach now. Adding low-dose metoprolol.   -Expected postoperative volume excess-weight is up about 6 kg.  Begin diuresis today  -Thrombocytopenia- trending down slowly but no indication for intervention. Monitor.   -DVT prophylaxis- started enoxaparin.  Leary Roca, PA-C [phone] 03/15/2021 7:25 AM    I have seen and examined the patient and agree with the assessment and plan as outlined.  Transfer 4E  Purcell Nails, MD 03/15/2021

## 2021-03-15 NOTE — Progress Notes (Signed)
Mobility Specialist: Progress Note   03/15/21 1503  Mobility  Activity Ambulated in hall  Level of Assistance Modified independent, requires aide device or extra time  Assistive Device Front wheel walker  Distance Ambulated (ft) 470 ft  Mobility Ambulated with assistance in hallway  Mobility Response Tolerated well  Mobility performed by Family member   Pt seen ambulating in the hallway with his wife. Pt states he is feeling good.   [facility] Fujie Dickison Mobility Specialist Mobility Specialist Phone: [phone]

## 2021-03-15 NOTE — Plan of Care (Signed)

## 2021-03-15 NOTE — Progress Notes (Signed)
CARDIAC REHAB PHASE I   Offered to walk with pt, pt recently back to bed from BR trip. Pt c/o pain. RN made aware. Pt very concerned with pain medicine and staying on schedule. Afraid to get "off track" like he did his first night. Spoke with pt at length. Pt agreeable to current plan. Encouraged continued ambulation, IS use, and OOB to chair. Will continue to follow.  [id number] Reynold Bowen, RN BSN 03/15/2021 2:37 PM

## 2021-03-15 NOTE — Hospital Course (Signed)
History of Present Illness  Patient is 65 year old male with history of mitral valve prolapse and mitral regurgitation, recurrent paroxysmal atrial fibrillation, and aneurysmal dilatation of the aortic root who has been referred for surgical consultation.   Patient states he has known of presence of a heart murmur for many years.  He states that he was told that he had rheumatic fever at age 43.  He has been followed for more than 10 years by Dr. Jens Som having originally presented with an episode of paroxysmal atrial fibrillation in the remote past.  He has been on long-term anticoagulation using Eliquis.  He has remained in sinus rhythm until last summer when he had another episode of paroxysmal atrial fibrillation that did not require cardioversion.  Transthoracic echocardiogram performed at that time revealed normal left ventricular systolic function with mitral valve prolapse and what was felt to be mild mitral regurgitation.   In early February of this year the patient developed COVID-19 pneumonia.  More than a week into his illness he suffered a syncopal episode at home for which she was hospitalized.  Work-up in the hospital included EKG, CTA of the chest, CT of the head and cervical spine, chest x-ray, orthostatic vital signs, carotid ultrasound, and blood work.  All of this turned out to be negative.   Transthoracic echocardiogram performed at that time revealed normal left ventricular systolic function with an obvious flail segment of the posterior leaflet of the mitral valve with at least moderate mitral regurgitation.  The patient was ultimately discharged home with his syncopal episode attributed to possible vasovagal event.  Holter monitor was planned but apparently never completed.  The patient was seen in follow-up by Dr. Jens Som and transesophageal echocardiogram performed Jan 16, 2021 to further evaluate the severity of mitral regurgitation.  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  Left ventricular size and systolic function appeared normal.  There was moderate left atrial enlargement.  The patient was subsequently referred for elective surgical consultation.   Patient is married and lives locally in [place] with his wife who is a former patient of mine.  Patient continues to work but he does not exercise at all on a regular basis.  However, he reports no significant physical limitations and he still tends to vigorous chores around the house.Marland Kitchen  He specifically denies any symptoms of exertional shortness of breath.  He states that he does seem to fatigue more easily than he used to with strenuous activity.  He has not had any palpitations nor other symptoms to suggest a recurrence of atrial fibrillation.  Since hospital discharge last February he has not had any further symptoms of dizziness or syncope.  He specifically denies any history of resting shortness of breath, PND, orthopnea, or lower extremity edema.  He has never had any chest pain or chest tightness either with activity or at rest.    Course in Hospital  Mr. Bromwell was admitted for elective surgery on 03/13/2021.  He was prepared and taken to the OR where minimally invasive mitral valve repair was accomplished along with left-sided cryo-maze procedure and clipping of the left atrial appendage.  Following the procedure, he separated from cardiopulmonary bypass without difficulty requiring no inotropic support.  He was extubated in the OR immediately following the procedure and was later transferred to the cardiovascular ICU.  He remained hemodynamically stable.  He required low-dose Neo-Synephrine overnight  but this was easily weaned off on the first postoperative day.  He was mobilized per protocol and made satisfactory progress with his mobility.  He was initially in a stable sinus  rhythm/sinus tachycardia early postoperatively.  He was not anticoagulated with Coumadin following surgery favoring resuming his Eliquis at discharge which she was taking prior to surgery.  He made satisfactory progress with his mobility.  He was transferred to 4 E. progressive care on postop day 3.  Chest tubes were removed along with pacing wires without complication.  He was diuresed with IV Lasix for expected postoperative volume excess.

## 2021-03-16 LAB — CBC
HCT: 34.5 % — ABNORMAL LOW (ref 39.0–52.0)
Hemoglobin: 11.4 g/dL — ABNORMAL LOW (ref 13.0–17.0)
MCH: 30.9 pg (ref 26.0–34.0)
MCHC: 33 g/dL (ref 30.0–36.0)
MCV: 93.5 fL (ref 80.0–100.0)
Platelets: 83 10*3/uL — ABNORMAL LOW (ref 150–400)
RBC: 3.69 MIL/uL — ABNORMAL LOW (ref 4.22–5.81)
RDW: 13.5 % (ref 11.5–15.5)
WBC: 7.1 10*3/uL (ref 4.0–10.5)
nRBC: 0 % (ref 0.0–0.2)

## 2021-03-16 LAB — BASIC METABOLIC PANEL
Anion gap: 6 (ref 5–15)
BUN: 17 mg/dL (ref 8–23)
CO2: 27 mmol/L (ref 22–32)
Calcium: 8.4 mg/dL — ABNORMAL LOW (ref 8.9–10.3)
Chloride: 98 mmol/L (ref 98–111)
Creatinine, Ser: 1.08 mg/dL (ref 0.61–1.24)
GFR, Estimated: >60 mL/min (ref >60)
Glucose, Bld: 102 mg/dL — ABNORMAL HIGH (ref 70–99)
Potassium: 4.2 mmol/L (ref 3.5–5.1)
Sodium: 131 mmol/L — ABNORMAL LOW (ref 135–145)

## 2021-03-16 LAB — MAGNESIUM: Magnesium: 2 mg/dL (ref 1.7–2.4)

## 2021-03-16 MED ORDER — POTASSIUM CHLORIDE CRYS ER 20 MEQ PO TBCR
20.0000 meq | EXTENDED_RELEASE_TABLET | Freq: Two times a day (BID) | ORAL | Status: DC
  Administered 2021-03-16 – 2021-03-17 (×3): 20 meq via ORAL
  Filled 2021-03-16 (×3): qty 1

## 2021-03-16 MED ORDER — FUROSEMIDE 10 MG/ML IJ SOLN
40.0000 mg | Freq: Two times a day (BID) | INTRAMUSCULAR | Status: AC
  Administered 2021-03-16 (×2): 40 mg via INTRAVENOUS
  Filled 2021-03-16 (×2): qty 4

## 2021-03-16 MED ORDER — FUROSEMIDE 40 MG PO TABS
40.0000 mg | ORAL_TABLET | Freq: Every day | ORAL | Status: DC
  Administered 2021-03-17: 40 mg via ORAL
  Filled 2021-03-16: qty 1

## 2021-03-16 MED ORDER — MAGNESIUM SULFATE 4 GM/100ML IV SOLN
4.0000 g | Freq: Once | INTRAVENOUS | Status: AC
  Administered 2021-03-16: 4 g via INTRAVENOUS
  Filled 2021-03-16: qty 100

## 2021-03-16 NOTE — Progress Notes (Signed)
Pacing wires were pulled at 1330. Patient tolerated well. No bleeding. Site is clean and dry. Patient on bed rest for 30 minutes. Brynda Rim, RN

## 2021-03-16 NOTE — Discharge Summary (Signed)
Physician Discharge Summary  Patient ID: Johnny [name] MRN: [id number] DOB/AGE: [birth year] 65 y.o.  Admit date: 03/13/2021 Discharge date: 03/17/2021  Admission Diagnoses:  Mitral regurgitation PAF (paroxysmal atrial fibrillation)   Discharge Diagnoses:   Principal Problem:   S/P minimally-invasive mitral valve repair + maze procedure Active Problems:   Mitral regurgitation   PAF (paroxysmal atrial fibrillation) (HCC)   S/P Maze operation for atrial fibrillation   Discharged Condition: stable  History of Present Illness  Patient is 65 year old male with history of mitral valve prolapse and mitral regurgitation, recurrent paroxysmal atrial fibrillation, and aneurysmal dilatation of the aortic root who has been referred for surgical consultation.   Patient states he has known of presence of a heart murmur for many years.  He states that he was told that he had rheumatic fever at age 65.  He has been followed for more than 10 years by Dr. Jens Som having originally presented with an episode of paroxysmal atrial fibrillation in the remote past.  He has been on long-term anticoagulation using Eliquis.  He has remained in sinus rhythm until last summer when he had another episode of paroxysmal atrial fibrillation that did not require cardioversion.  Transthoracic echocardiogram performed at that time revealed normal left ventricular systolic function with mitral valve prolapse and what was felt to be mild mitral regurgitation.   In early February of this year the patient developed COVID-19 pneumonia.  More than a week into his illness he suffered a syncopal episode at home for which she was hospitalized.  Work-up in the hospital included EKG, CTA of the chest, CT of the head and cervical spine, chest x-ray, orthostatic vital signs, carotid ultrasound, and blood work.  All of this turned out to be negative.   Transthoracic echocardiogram performed at that time revealed normal left ventricular  systolic function with an obvious flail segment of the posterior leaflet of the mitral valve with at least moderate mitral regurgitation.  The patient was ultimately discharged home with his syncopal episode attributed to possible vasovagal event.  Holter monitor was planned but apparently never completed.  The patient was seen in follow-up by Dr. Jens Som and transesophageal echocardiogram performed Jan 16, 2021 to further evaluate the severity of mitral regurgitation.  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  Left ventricular size and systolic function appeared normal.  There was moderate left atrial enlargement.  The patient was subsequently referred for elective surgical consultation.   Patient is married and lives locally in [place] with his wife who is a former patient of mine.  Patient continues to work but he does not exercise at all on a regular basis.  However, he reports no significant physical limitations and he still tends to vigorous chores around the house.Marland Kitchen  He specifically denies any symptoms of exertional shortness of breath.  He states that he does seem to fatigue more easily than he used to with strenuous activity.  He has not had any palpitations nor other symptoms to suggest a recurrence of atrial fibrillation.  Since hospital discharge last February he has not had any further symptoms of dizziness or syncope.  He specifically denies any history of resting shortness of breath, PND, orthopnea, or lower extremity edema.  He has never had any chest pain or chest tightness either with activity or at rest.    Course in Hospital  [name] was admitted for elective  surgery on 03/13/2021.  He was prepared and taken to the OR where minimally invasive mitral valve repair was accomplished along with left-sided cryo-maze procedure and clipping of the left atrial  appendage.  Following the procedure, he separated from cardiopulmonary bypass without difficulty requiring no inotropic support.  He was extubated in the OR immediately following the procedure and was later transferred to the cardiovascular ICU.  He remained hemodynamically stable.  He required low-dose Neo-Synephrine overnight but this was easily weaned off on the first postoperative day.  He was mobilized per protocol and made satisfactory progress with his mobility.  He was initially in a stable sinus rhythm/sinus tachycardia early postoperatively.  He was not anticoagulated with Coumadin following surgery favoring resuming his Eliquis at discharge which she was taking prior to surgery.  He made satisfactory progress with his mobility.  He was transferred to 4 E. progressive care on postop day 3.  Chest tubes were removed along with pacing wires without complication.  He was diuresed with IV Lasix for expected postoperative volume excess. He had mild thrombocytopenia without bleeding complication and his platelet count was recovering by the time of his discharge. He was weaned from supplemental O2 by the 3rd post-op day.  His sinus tachycardia persisted with a rate around 100.  EKG was done on postop day 3 and confirmed there was no underlying atrial tachyarrhythmias.  By the fourth postoperative day, he had been weaned to room air.  His blood pressure had gradually increased to the point that we could uptitrate the metoprolol to help manage his heart rate.  Follow-up chest x-ray on postop day 4 showed expected postoperative changes.  No significant pleural effusions. At the time of discharge home he was completely independent with his mobility.  He was tolerating cardiac diet with no trouble.  His incision was healing with no sign of complication.  He had returned to his baseline weight.  He is advised to resume his Eliquis at 5 mg twice daily at home. Prior to discharge, he was given careful instructions  regarding diet, activity, wound care, medications.  Consults: None  Significant Diagnostic Studies:   CLINICAL DATA:  Status post cardiac surgery   EXAM: CHEST - 2 VIEW   COMPARISON:  03/15/2021   FINDINGS: Left jugular sheath has been removed in the interval. Cardiac shadow is stable. Postsurgical changes are again noted. Linear atelectatic changes are noted in the bases bilaterally stable from the prior study. No new focal infiltrate is seen. No pneumothorax is noted.   IMPRESSION: Bibasilar atelectatic changes stable from the prior exam.     Electronically Signed   By: Alcide CleverMark  Lukens M.D.   On: 03/17/2021 08:11  Treatments:   CARDIOTHORACIC SURGERY OPERATIVE NOTE   Date of Procedure:                            03/13/2021   Preoperative Diagnosis:         Severe Mitral Regurgitation Paroxysmal Atrial Fibrillation   Postoperative Diagnosis:    Same   Procedure:        Minimally-Invasive Mitral Valve Repair             Complex valvuloplasty including artificial PTFE neochord placement x4             Medtronic Simuform Ring Annuloplasty (size 36mm, model [id number], serial [id number])   Minimally-Invasive Maze Procedure  Left atrial lesion set using cryothermy ablation             Clipping of Left Atrial Appendage (Atricure Pro245 left atrial clip, size 45 mm)   Surgeon:        Salvatore Decent. 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   Patient states he has known of presence of a heart murmur for many years.  He states that he was told that he had rheumatic fever at age 36.  He has been followed for more than 10 years by Dr. Jens Som having originally presented with an episode of paroxysmal atrial fibrillation in the remote past.  He has been on long-term anticoagulation using Eliquis.  He has remained in sinus rhythm until last summer when he had another episode of paroxysmal atrial fibrillation that did not require cardioversion.  Transthoracic echocardiogram performed at that time revealed normal left ventricular systolic function with mitral valve prolapse and what was felt to be mild mitral regurgitation.   In early February of this year the patient developed COVID-19 pneumonia.  More than a week into his illness he suffered a syncopal episode at home for which she was hospitalized.  Work-up in the hospital included EKG, CTA of the chest, CT of the head and cervical spine, chest x-ray, orthostatic vital signs, carotid ultrasound, and blood work.  All of this turned out to be negative.   Transthoracic echocardiogram performed at that time revealed normal left ventricular systolic function with an obvious flail segment of the posterior leaflet of the mitral valve with at least moderate mitral regurgitation.  The patient was ultimately discharged home with his syncopal episode attributed to possible vasovagal event.  Holter monitor was planned but apparently never completed.  The patient was seen in follow-up by Dr. Jens Som and transesophageal echocardiogram performed Jan 16, 2021 to further evaluate the severity of mitral regurgitation.  TEE revealed obvious ruptured primary chordae tendinae with flail segment involving portion of the middle scallop (P2) of  the posterior leaflet and severe mitral regurgitation.  Left ventricular size and systolic function appeared normal.  There was moderate left atrial enlargement.  The patient was subsequently referred for elective surgical consultation.   The patient has been seen in consultation and counseled at length regarding the indications, risks and potential benefits of surgery.  All questions have been answered, and the patient provides full informed consent for the operation as described.   Discharge Exam: Blood pressure 118/73, pulse (!) 108, temperature 97.8 F (36.6 C), temperature source Oral, resp. rate 17, height 6\' 2"  (1.88 m), weight 104 kg, SpO2 93 %.  General appearance: alert, cooperative, and no distress Neurologic: intact Heart: Sinus rhythm / Sinus tach.  No murmur. Lungs: Breath sounds are clear  bilaterally Abdomen: The abdomen is soft and nontender Extremities: All extremities well perfused.   Wound: The right chest incision intact and dry with expected bruising. .  Disposition: Discharged to home in stable condition.   Discharge Instructions     AMB Referral to Cardiac Rehabilitation - Phase II   Complete by: As directed    Diagnosis: Valve Repair   Valve: Mitral      Allergies as of 03/17/2021       Reactions   Bee Venom Anaphylaxis   Antihistamines, Chlorpheniramine-type Other (See Comments)   Acts as a depressant with pt.        Medication List     STOP taking these medications    metoprolol succinate 25 MG 24 hr tablet Commonly known as: TOPROL-XL   propranolol 10 MG tablet Commonly known as: INDERAL       TAKE these medications    acetaminophen 325 MG tablet Commonly known as: Tylenol Take 2 tablets (650 mg total) by mouth every 4 (four) hours as needed for moderate pain.   acetaminophen 325 MG tablet Commonly known as: Tylenol Take 2 tablets (650 mg total) by mouth every 4 (four) hours as needed.   Eliquis 5 MG Tabs tablet Generic drug:  apixaban Take 1 tablet by mouth twice daily What changed: how much to take   furosemide 40 MG tablet Commonly known as: LASIX Take 1 tablet (40 mg total) by mouth daily for 3 days. Start taking on: March 18, 2021   Hydroxocobalamin Acetate 1000 MCG/ML Soln Inject 1,000 mcg into the muscle 3 (three) times a week.   MAGNESIUM PO Take 12 mg by mouth daily.   metoprolol tartrate 25 MG tablet Commonly known as: LOPRESSOR Take 1 tablet (25 mg total) by mouth 2 (two) times daily.   multivitamin with minerals Tabs tablet Take 1 tablet by mouth in the morning.   oxyCODONE 5 MG immediate release tablet Commonly known as: Oxy IR/ROXICODONE Take 1 tablet (5 mg total) by mouth every 4 (four) hours as needed for up to 5 days for severe pain.   potassium chloride SA 20 MEQ tablet Commonly known as: KLOR-CON Take 1 tablet (20 mEq total) by mouth daily for 3 days.   Vitamin D3 250 MCG (10000 UT) capsule Take 10,000 Units by mouth in the morning.   Zinc 50 MG Tabs Take 50 mg by mouth daily.        Follow-up Information     [facility] [place] Follow up on 03/28/2021.   Specialty: Cardiothoracic Surgery Why: Your appointment for suture removal is at 11:00am Contact information: [address] [place], [address] [place] [address] [phone]        [facility] [place]. Go on 04/10/2021.   Specialty: Cardiothoracic Surgery Why: Your appointment is at 1:-00pm Please arrive 30 minutes early for a chest x-ray to be performed by [facility] imaging located on the first floor of the same building. Contact information: [address] [place], [address] [place] [address] [phone]        [facility] CARDIOVASCULAR IMAGING [address] Follow up on 05/01/2021.   Specialty: Cardiology Why: Your follow-up echocardiography is scheduled for Monday, 05/01/2021, at 10:30 AM Contact information: [phone] m[place] [address] [phone]        [facility]. Go on 04/03/2021.   Specialty: Cardiology Why: Your appointment with Gillian Shields is at 9:30am. Contact information: [phone]  [address] [place] [address] [phone]                Signed: Leary Roca, PA-C 03/17/2021, 9:14 AM

## 2021-03-16 NOTE — Progress Notes (Addendum)
[address]            [place] [id number]          [phone]   3 Days Post-Op Procedure(s) (LRB): MINIMALLY INVASIVE MITRAL VALVE REPAIR (MVR) USING MEDTRONIC SIMUFORM RING SIZE (Right) MAZE (N/A) TRANSESOPHAGEAL ECHOCARDIOGRAM (TEE) (N/A) CLIPPING OF ATRIAL APPENDAGE  USING ATRICURE CLIP PRO2 SIZE  Total Length of Stay:  LOS: 3 days   Subjective:  Transferred to 4E yesterday. Walked in the hall several times.  Awake and alert.  No events overnight. O2 at 2L/[place]  Objective: Vital signs in last 24 hours: Temp:  [97.7 F (36.5 C)-98.7 F (37.1 C)] 98.2 F (36.8 C) (06/30 0503) Pulse Rate:  [94-100] 97 (06/30 0503) Cardiac Rhythm: Sinus tachycardia (06/29 1901) Resp:  [8-19] 14 (06/30 0503) BP: (91-122)/(61-90) 109/75 (06/30 0503) SpO2:  [90 %-96 %] 96 % (06/30 0503) Weight:  [109.1 kg] 109.1 kg (06/30 0503)  Filed Weights   03/14/21 0530 03/15/21 0604 03/16/21 0503  Weight: 111.6 kg 111.6 kg 109.1 kg    Weight change: -2.464 kg     Intake/Output from previous day: 06/29 0701 - 06/30 0700 In: 120 [P.O.:120] Out: 1825 [Urine:1525; Chest Tube:300]  Intake/Output this shift: No intake/output data recorded.  Current Meds: Scheduled Meds:  acetaminophen  1,000 mg Oral Q6H   aspirin EC  325 mg Oral Daily   bisacodyl  10 mg Oral Daily   Or   bisacodyl  10 mg Rectal Daily   Chlorhexidine Gluconate Cloth  6 each Topical Daily   [facility], Patient & Family Education   Does not apply Once   docusate sodium  200 mg Oral Daily   enoxaparin (LOVENOX) injection  40 mg Subcutaneous QHS   furosemide  40 mg Intravenous BID   [START ON 03/17/2021] furosemide  40 mg Oral Daily   metoprolol tartrate  12.5 mg Oral BID   pantoprazole  40 mg Oral Daily   potassium chloride  20 mEq Oral BID WC   sodium chloride flush  3 mL Intravenous Q12H   Continuous Infusions:  sodium chloride     lactated ringers      magnesium sulfate bolus IVPB 4 g (03/16/21 0612)   PRN Meds:.hydrocortisone cream, lip balm, metoprolol tartrate, morphine injection, ondansetron (ZOFRAN) IV, oxyCODONE, sodium chloride flush, traMADol  General appearance: alert, cooperative, and no distress Neurologic: intact Heart: Sinus rhythm / Sinus tach.  No murmur. Lungs: Breath sounds are clear bilaterally Chest tube drainage 57ml past 12 hours.  Abdomen: The abdomen is soft and nontender Extremities: All extremities well perfused.   Wound: The right chest incision is covered with a dry Aquacel dressing.  Lab Results: CBC: Recent Labs    03/15/21 0513 03/16/21 0034  WBC 9.5 7.1  HGB 11.9* 11.4*  HCT 36.1* 34.5*  PLT 76* 83*    BMET:  Recent Labs    03/15/21 0513 03/16/21 0034  NA 131* 131*  K 4.1 4.2  CL 102 98  CO2 24 27  GLUCOSE 104* 102*  BUN 13 17  CREATININE 1.02 1.08  CALCIUM 8.1* 8.4*     CMET: Lab Results  Component Value Date   WBC 7.1 03/16/2021   HGB 11.4 (L) 03/16/2021   HCT 34.5 (L) 03/16/2021   PLT 83 (L) 03/16/2021   GLUCOSE 102 (H) 03/16/2021   CHOL 165  04/13/2020   TRIG 66 10/26/2020   HDL 37 (L) 04/13/2020   LDLCALC 115 (H) 04/13/2020   ALT 16 03/10/2021   AST 35 03/10/2021   NA 131 (L) 03/16/2021   K 4.2 03/16/2021   CL 98 03/16/2021   CREATININE 1.08 03/16/2021   BUN 17 03/16/2021   CO2 27 03/16/2021   TSH 0.645 10/26/2020   INR 1.3 (H) 03/15/2021   HGBA1C 6.0 (H) 03/10/2021      PT/INR:  Recent Labs    03/15/21 0513  LABPROT 16.3*  INR 1.3*    Radiology: No results found.   Assessment/Plan: S/P Procedure(s) (LRB): MINIMALLY INVASIVE MITRAL VALVE REPAIR (MVR) USING MEDTRONIC SIMUFORM RING SIZE (Right) MAZE (N/A) TRANSESOPHAGEAL ECHOCARDIOGRAM (TEE) (N/A) CLIPPING OF ATRIAL APPENDAGE  USING ATRICURE CLIP PRO2 SIZE  -Postop day 3 minimally invasive mitral valve repair for severe MR.  VS and cardiac rhythm stable.  Remove the chest tubes and pacer  wires today.  Continue diuresis.  Advance activity.  No Coumadin,  plan to resume his Eliquis at discharge. Keep in ICU.  -Remote history of atrial fibrillation-status post left side cryo-MAZE and left atrial clip application. S. Tach now. Continue low-dose metoprolol since BP is in the low 100's   -Expected postoperative volume excess-weight is down 2.5kg from yesterday. Continue diuresis.   -Thrombocytopenia- Plt count trending up. Monitor.  -DVT prophylaxis- continue  enoxaparin.  Leary Roca, PA-C [phone] 03/16/2021 7:46 AM   I have seen and examined the patient and agree with the assessment and plan as outlined.  D/C pacing wires and tubes.  Continue diuresis.  Anticipate likely D/C home tomorrow.  Restart Eliquis at hospital D/C   Purcell Nails, MD 03/16/2021 8:04 AM

## 2021-03-16 NOTE — Discharge Instructions (Signed)

## 2021-03-16 NOTE — Care Management Important Message (Signed)
Important Message  Patient Details  Name: Johnny [name] MRN: [id number] Date of Birth: [birth year]   Medicare Important Message Given:  Yes     Amyah Clawson Stefan Church 03/16/2021, 2:53 PM

## 2021-03-16 NOTE — Progress Notes (Signed)
CARDIAC REHAB PHASE I   PRE:  Rate/Rhythm: 102 ST  BP:  Sitting: 112/82      SaO2: 93 2L  MODE:  Ambulation: 470 ft   POST:  Rate/Rhythm: 115 ST  BP:  Sitting: 120/85    SaO2: 94 2L  Pt ambulated 431ft in hallway assist of one with front wheel walker. Pt encouraged several times throughout walk to slow his pace. Pt c/o R hand pain, stating it hurts when below the level of his heart, so he would not use it to steer walker. For CT d/c later today, can trial ambulation without walker. Pt returned to recliner. Encouraged continued ambulation and IS use. Will continue to follow.  [id number] Reynold Bowen, RN BSN 03/16/2021 11:37 AM

## 2021-03-17 ENCOUNTER — Inpatient Hospital Stay (HOSPITAL_COMMUNITY): Payer: Medicare Other

## 2021-03-17 MED ORDER — METOPROLOL TARTRATE 25 MG PO TABS
25.0000 mg | ORAL_TABLET | Freq: Two times a day (BID) | ORAL | Status: DC
  Administered 2021-03-17: 25 mg via ORAL
  Filled 2021-03-17: qty 1

## 2021-03-17 MED ORDER — FUROSEMIDE 40 MG PO TABS: 40.0000 mg | ORAL_TABLET | Freq: Every day | ORAL | 0 refills | Status: DC

## 2021-03-17 MED ORDER — POTASSIUM CHLORIDE CRYS ER 20 MEQ PO TBCR: 20.0000 meq | EXTENDED_RELEASE_TABLET | Freq: Every day | ORAL | 0 refills | Status: DC

## 2021-03-17 MED ORDER — MAGNESIUM SULFATE 4 GM/100ML IV SOLN
4.0000 g | Freq: Once | INTRAVENOUS | Status: AC
  Administered 2021-03-17: 4 g via INTRAVENOUS
  Filled 2021-03-17: qty 100

## 2021-03-17 MED ORDER — ACETAMINOPHEN 325 MG PO TABS: 650.0000 mg | ORAL_TABLET | ORAL | 2 refills | Status: DC | PRN

## 2021-03-17 MED ORDER — METOPROLOL TARTRATE 25 MG PO TABS: 25.0000 mg | ORAL_TABLET | Freq: Two times a day (BID) | ORAL | 2 refills | Status: DC

## 2021-03-17 MED ORDER — OXYCODONE HCL 5 MG PO TABS: 5.0000 mg | ORAL_TABLET | ORAL | 0 refills | Status: AC | PRN

## 2021-03-17 NOTE — Progress Notes (Signed)
[address]            [place] [address]          [phone]   4 Days Post-Op Procedure(s) (LRB): MINIMALLY INVASIVE MITRAL VALVE REPAIR (MVR) USING MEDTRONIC SIMUFORM RING SIZE (Right) MAZE (N/A) TRANSESOPHAGEAL ECHOCARDIOGRAM (TEE) (N/A) CLIPPING OF ATRIAL APPENDAGE  USING ATRICURE CLIP PRO2 SIZE  Total Length of Stay:  LOS: 4 days   No new concerns. Walked several times yesterday. On O2 at 1L last night, on RA now.  Having appropriate bowel function.   Objective: Vital signs in last 24 hours: Temp:  [98.3 F (36.8 C)-98.5 F (36.9 C)] 98.4 F (36.9 C) (07/01 0500) Pulse Rate:  [102-111] 102 (07/01 0500) Cardiac Rhythm: Sinus tachycardia (06/30 1914) Resp:  [15-20] 20 (07/01 0500) BP: (107-137)/(75-87) 137/87 (07/01 0500) SpO2:  [94 %-95 %] 95 % (07/01 0500) Weight:  [104 kg] 104 kg (07/01 0500)  Filed Weights   03/15/21 0604 03/16/21 0503 03/17/21 0500  Weight: 111.6 kg 109.1 kg 104 kg    Weight change: -5.126 kg     Intake/Output from previous day: 06/30 0701 - 07/01 0700 In: 480 [P.O.:480] Out: 50 [Chest Tube:50]  Intake/Output this shift: No intake/output data recorded.  Current Meds: Scheduled Meds:  acetaminophen  1,000 mg Oral Q6H   aspirin EC  325 mg Oral Daily   bisacodyl  10 mg Oral Daily   Or   bisacodyl  10 mg Rectal Daily   Chlorhexidine Gluconate Cloth  6 each Topical Daily   [facility], Patient & Family Education   Does not apply Once   docusate sodium  200 mg Oral Daily   furosemide  40 mg Oral Daily   metoprolol tartrate  25 mg Oral BID   pantoprazole  40 mg Oral Daily   potassium chloride  20 mEq Oral BID WC   sodium chloride flush  3 mL Intravenous Q12H   Continuous Infusions:  sodium chloride     lactated ringers     magnesium sulfate bolus IVPB 4 g (03/17/21 0629)   PRN Meds:.hydrocortisone cream, lip balm, metoprolol tartrate, morphine injection, ondansetron  (ZOFRAN) IV, oxyCODONE, sodium chloride flush, traMADol  General appearance: alert, cooperative, and no distress Neurologic: intact Heart: Sinus rhythm / Sinus tach.  No murmur. Lungs: Breath sounds are clear bilaterally Abdomen: The abdomen is soft and nontender Extremities: All extremities well perfused.   Wound: The right chest incision intact and dry with expected bruising. .  Lab Results: CBC: Recent Labs    03/15/21 0513 03/16/21 0034  WBC 9.5 7.1  HGB 11.9* 11.4*  HCT 36.1* 34.5*  PLT 76* 83*    BMET:  Recent Labs    03/15/21 0513 03/16/21 0034  NA 131* 131*  K 4.1 4.2  CL 102 98  CO2 24 27  GLUCOSE 104* 102*  BUN 13 17  CREATININE 1.02 1.08  CALCIUM 8.1* 8.4*     CMET: Lab Results  Component Value Date   WBC 7.1 03/16/2021   HGB 11.4 (L) 03/16/2021   HCT 34.5 (L) 03/16/2021   PLT 83 (L) 03/16/2021   GLUCOSE 102 (H) 03/16/2021   CHOL 165 04/13/2020   TRIG 66 10/26/2020   HDL 37 (L) 04/13/2020   LDLCALC 115 (H) 04/13/2020   ALT 16 03/10/2021   AST 35 03/10/2021   NA  131 (L) 03/16/2021   K 4.2 03/16/2021   CL 98 03/16/2021   CREATININE 1.08 03/16/2021   BUN 17 03/16/2021   CO2 27 03/16/2021   TSH 0.645 10/26/2020   INR 1.3 (H) 03/15/2021   HGBA1C 6.0 (H) 03/10/2021      PT/INR:  Recent Labs    03/15/21 0513  LABPROT 16.3*  INR 1.3*    Radiology: No results found.   Assessment/Plan: S/P Procedure(s) (LRB): MINIMALLY INVASIVE MITRAL VALVE REPAIR (MVR) USING MEDTRONIC SIMUFORM RING SIZE (Right) MAZE (N/A) TRANSESOPHAGEAL ECHOCARDIOGRAM (TEE) (N/A) CLIPPING OF ATRIAL APPENDAGE  USING ATRICURE CLIP PRO2 SIZE  -Postop day 34 minimally invasive mitral valve repair for severe MR.  VS and cardiac rhythm stable.    -Remote history of atrial fibrillation-status post left side cryo-MAZE and left atrial clip application. S. Tach persists. BP will now support increasing the metoprolol.   -Expected postoperative volume  excess-weight is near pre-op level.   -Disposition-Plan for discharge to home later this morning. Resume Eliquis.    Leary Roca, PA-C [phone] 03/17/2021 7:57 AM

## 2021-03-17 NOTE — Progress Notes (Signed)
CARDIAC REHAB PHASE I   D/c education completed with pt. Pt educated on importance of site care and monitoring incisions daily. Encouraged continued IS use and walks. Pt given heart healthy diet. Reviewed restrictions and exercise guidelines. Will refer to CRP II HP.   [phone] Reynold Bowen, RN BSN 03/17/2021 10:20 AM

## 2021-03-17 NOTE — Progress Notes (Signed)
D/C instructions given to patient. Medications and wound care instructions given to patient. All questions answered. IV removed clean dry and intact.  Versie Starks, RN

## 2021-03-17 NOTE — Care Management Important Message (Signed)
Important Message  Patient Details  Name: Johnny [name] MRN: [id number] Date of Birth: 1956/02/22   Medicare Important Message Given:  Yes  Patient left prior to IM delivery.  Will mailed the document to the patients home address.     Welton Bord 03/17/2021, 2:31 PM

## 2021-03-17 NOTE — Care Management Important Message (Signed)
Important Message  Patient Details  Name: Johnny [name] MRN: [id number] Date of Birth: [birth year]   Medicare Important Message Given:  Yes  Patient left prior to IM delivery.  Will mailed the document to the patients home address.     Swayze Kozuch 03/17/2021, 2:33 PM

## 2021-03-17 NOTE — Anesthesia Postprocedure Evaluation (Signed)
Anesthesia Post Note  Patient: Johnny [name]  Procedure(s) Performed: MINIMALLY INVASIVE MITRAL VALVE REPAIR (MVR) USING MEDTRONIC SIMUFORM RING SIZE (Right: Chest) MAZE TRANSESOPHAGEAL ECHOCARDIOGRAM (TEE) CLIPPING OF ATRIAL APPENDAGE  USING ATRICURE CLIP PRO2 SIZE (Chest)     Patient location during evaluation: ICU Anesthesia Type: General Level of consciousness: patient cooperative and awake Pain management: pain level controlled Vital Signs Assessment: post-procedure vital signs reviewed and stable Respiratory status: spontaneous breathing, nonlabored ventilation, respiratory function stable and patient connected to nasal cannula oxygen Cardiovascular status: blood pressure returned to baseline and stable Postop Assessment: no apparent nausea or vomiting Anesthetic complications: no   No notable events documented.  Last Vitals:  Vitals:   03/17/21 0500 03/17/21 0859  BP: 137/87 118/73  Pulse: (!) 102 (!) 108  Resp: 20 17  Temp: 36.9 C 36.6 C  SpO2: 95% 93%    Last Pain:  Vitals:   03/17/21 1049  TempSrc:   PainSc: 0-No pain                 Alajah Witman

## 2021-03-22 LAB — TYPE AND SCREEN
ABO/RH(D): O POS
Antibody Screen: NEGATIVE
Unit division: 0
Unit division: 0

## 2021-03-22 LAB — BPAM RBC
Blood Product Expiration Date: 202207302359
Blood Product Expiration Date: 202207302359
ISSUE DATE / TIME: 202206261434
ISSUE DATE / TIME: 202206261434
Unit Type and Rh: 5100
Unit Type and Rh: 5100

## 2021-03-23 ENCOUNTER — Other Ambulatory Visit: Payer: Self-pay

## 2021-03-23 DIAGNOSIS — M546 Pain in thoracic spine: Secondary | ICD-10-CM

## 2021-03-23 MED ORDER — CYCLOBENZAPRINE HCL 10 MG PO TABS: 10.0000 mg | ORAL_TABLET | Freq: Three times a day (TID) | ORAL | 0 refills | Status: DC | PRN

## 2021-03-24 ENCOUNTER — Telehealth (HOSPITAL_COMMUNITY): Payer: Self-pay

## 2021-03-24 NOTE — Telephone Encounter (Signed)
Per phase I cardiac rehab, fax cardiac rehab referral to [place] [facility]. 

## 2021-03-28 ENCOUNTER — Other Ambulatory Visit: Payer: Self-pay

## 2021-03-28 ENCOUNTER — Ambulatory Visit (INDEPENDENT_AMBULATORY_CARE_PROVIDER_SITE_OTHER): Payer: Self-pay

## 2021-03-28 VITALS — Ht 74.0 in

## 2021-03-28 DIAGNOSIS — Z4802 Encounter for removal of sutures: Secondary | ICD-10-CM

## 2021-03-28 NOTE — Progress Notes (Signed)
Patient arrived for nurse visit to remove 2 sutures post- procedure Mini MVRepair 03/13/21 with Dr. Cornelius Moras.  Sutures removed with no signs/ symptoms of infection noted.  Patient tolerated procedure well.  Patient/ family instructed to keep the incision sites clean and dry.  Patient/ family acknowledged instructions given.    Patient advised on pain medication and when to take it. Patient advised to keep active and slowly increase activity. He acknowledged receipt. He is aware of his follow-up appointment.

## 2021-04-02 NOTE — Progress Notes (Signed)
Office Visit    Patient Name: Johnny PeckLarry E Girdner Date of Encounter: 04/03/2021  PCP:  Marisue BrooklynSaguier, Edward, PA-C   [facility]  Cardiologist:  Olga MillersBrian Crenshaw, MD  Advanced Practice Provider:  No care team member to display Electrophysiologist:  None   Chief Complaint    Johnny [name] is a 65 y.o. male with a hx of mitral valve prolapse and mitral regurgitation s/p minimally invasive mitral valve repair, recurrent paroxysmal atrial fibrillation s/p MAZE, aneurysmal dilation of the aortic root  presents today for hospital follow up.   Past Medical History    Past Medical History:  Diagnosis Date   Atrial fibrillation (HCC)    Chronic insomnia    Dysrhythmia    afib   Heart murmur    History of rheumatic fever    age 41   Mitral regurgitation    Patent foramen ovale    S/P Maze operation for atrial fibrillation 03/13/2021   Left side atrial lesion set using cryothermy with clipping of LA appendage   S/P minimally-invasive mitral valve repair 03/13/2021   Complex valvuloplasty including artificial PTFE neochord placement x4 with 36 mm Medtronic Simuform ring annuloplasty   Thoracic aortic aneurysm [facility](HCC)    Past Surgical History:  Procedure Laterality Date   CARDIAC CATHETERIZATION  02/10/2021   CARDIOVERSION     CLIPPING OF ATRIAL APPENDAGE  03/13/2021   Procedure: CLIPPING OF ATRIAL APPENDAGE  USING ATRICURE CLIP PRO2 SIZE 45MM;  Surgeon: Purcell Nailswen, Clarence H, MD;  [facility];  Service: Open Heart Surgery;;   MAZE N/A 03/13/2021   Procedure: MAZE;  Surgeon: Purcell Nailswen, Clarence H, MD;  [facility];  Service: Open Heart Surgery;  Laterality: N/A;   MITRAL VALVE REPAIR Right 03/13/2021   Procedure: MINIMALLY INVASIVE MITRAL VALVE REPAIR (MVR) USING MEDTRONIC SIMUFORM RING SIZE 36MM;  Surgeon: Purcell Nailswen, Clarence H, MD;  [facility];  Service: Open Heart Surgery;  Laterality: Right;   RIGHT/LEFT HEART CATH AND CORONARY ANGIOGRAPHY N/A 02/10/2021   Procedure:  RIGHT/LEFT HEART CATH AND CORONARY ANGIOGRAPHY;  Surgeon: Tonny Bollmanooper, Michael, MD;  [facility];  Service: Cardiovascular;  Laterality: N/A;   TEE WITHOUT CARDIOVERSION N/A 01/16/2021   Procedure: TRANSESOPHAGEAL ECHOCARDIOGRAM (TEE);  Surgeon: Lewayne Buntingrenshaw, Brian S, MD;  [facility];  Service: Cardiovascular;  Laterality: N/A;   TEE WITHOUT CARDIOVERSION N/A 03/13/2021   Procedure: TRANSESOPHAGEAL ECHOCARDIOGRAM (TEE);  Surgeon: Purcell Nailswen, Clarence H, MD;  [facility];  Service: Open Heart Surgery;  Laterality: N/A;    Allergies  Allergies  Allergen Reactions   Bee Venom Anaphylaxis   Antihistamines, Chlorpheniramine-Type Other (See Comments)    Acts as a depressant with pt.    History of Present Illness    Johnny PeckLarry E Pusey is a 65 y.o. male with a hx of mitral valve prolapse and mitral regurgitation s/p minimally invasive mitral valve repair, recurrent paroxysmal atrial fibrillation s/p MAZE, aneurysmal dilation of the aortic root last seen while hospitalized.  He has had a murmur for many years and had rheumatic fever at the age of 1. He has been followed for many years by Dr. Jens Somrenshaw due to atrial fibrillation.  February 2022 developed COVID19 pneumonia. He had syncopal episode and was hospitalized. Workup included EKG, CTA of chst, CT head and cervical spins, CXR, orthostatic vitals, carotid duplex, blood work were unremarkable. Echocardiogram at the time with normal LVEF with obvious flail segment of posterior leaflet of mitral valve with moderate mitral regurgitation. He  was discharged.  TEE 01/16/21 with obvious ruptured primary chordae tendinae with flail segment involving portion of the middle scallop (P2) of the posterior leaflet and severe MR. LV size and systolic function appeared normal. There was moderate left atrial enlargement. He was referred for elective surgical consultation.   He was admitted 03/13/21 for elective invasive mitral valve repair with  left-sided cryo-maze procedure and clipping of left atrial appendage. He was diuresed with IV lasix due to expected postoperative volume excess. His Eliquis was resumed at discharge.   He presents today for follow up with his wife.  We reviewed his hospitalization in detail.  He had understandable questions regarding his mitral repair, left atrial appendage clipping, Maze procedure. Reports no shortness of breath nor dyspnea on exertion. Reports no chest pain, pressure, or tightness.  Reports some incisional discomfort for which he has been taking pain reliever as cardiothoracic surgery.  No edema, orthopnea, PND. Reports no palpitations.    EKGs/Labs/Other Studies Reviewed:   The following studies were reviewed today:  TEE 01/16/21  1. Flail P2 segement of posterior MV leaflet with severe eccentric MR.   2. Left ventricular ejection fraction, by estimation, is 60 to 65%. The  left ventricle has normal function.   3. Right ventricular systolic function is normal. The right ventricular  size is normal.   4. Left atrial size was moderately dilated. No left atrial/left atrial  appendage thrombus was detected.   5. The mitral valve is abnormal. Severe mitral valve regurgitation. There  is severe holosystolic prolapse of the middle scallop of the posterior  leaflet of the mitral valve.   6. The aortic valve is tricuspid. Aortic valve regurgitation is not  visualized.   7. Aortic dilatation noted. There is mild dilatation of the aortic root,  measuring 44 mm. There is mild dilatation of the ascending aorta,  measuring 42 mm.  US Doppler Carotid 03/10/21 Summary:  Right Carotid: The extracranial vessels were near-normal with only minimal wall thickening or plaque.   Left Carotid: The extracranial vessels were near-normal with only minimal wall thickening or plaque.  Vertebrals:  Bilateral vertebral arteries demonstrate antegrade flow.  Subclavians: Normal flow hemodynamics were seen in bilateral  subclavian arteries.   Right Upper Extremity: No significant arterial obstruction detected in the  right upper extremity. Doppler waveforms remain within normal limits with right radial compression. Doppler waveform obliterate with right ulnar compression.   Left Upper Extremity: No significant arterial obstruction detected in the  left upper extremity. Doppler waveforms remain within normal limits with  left radial compression. Doppler waveforms decrease >50% with left ulnar compression.   R/LHC 02/10/21  1. Widely patent coronary arteries with no obstructive CAD 2. Normal intracardiac hemodynamics with low diastolic filling pressures  EKG:  EKG is ordered today.  The ekg ordered today demonstrates ST 101 bpm with occasional PVC, stable TWI lead III. No acute ST/T wave changes.   Recent Labs: 04/12/2020: B Natriuretic Peptide 508.8 10/26/2020: TSH 0.645 03/10/2021: ALT 16 03/16/2021: BUN 17; Creatinine, Ser 1.08; Hemoglobin 11.4; Magnesium 2.0; Platelets 83; Potassium 4.2; Sodium 131  Recent Lipid Panel    Component Value Date/Time   CHOL 165 04/13/2020 0143   TRIG 66 10/26/2020 1043   HDL 37 (L) 04/13/2020 0143   CHOLHDL 4.5 04/13/2020 0143   VLDL 13 04/13/2020 0143   LDLCALC 115 (H) 04/13/2020 0143    Risk Assessment/Calculations:   CHA2DS2-VASc Score = 2  {This indicates a 2.2% annual risk of stroke. The patient's  score is based upon: CHF History: No HTN History: No Diabetes History: No Stroke History: No Vascular Disease History: Yes Age Score: 1 Gender Score: 0   Home Medications   Current Meds  Medication Sig   acetaminophen (TYLENOL) 325 MG tablet Take 2 tablets (650 mg total) by mouth every 4 (four) hours as needed.   Cholecalciferol (VITAMIN D3) 250 MCG (10000 UT) capsule Take 10,000 Units by mouth in the morning.   cyclobenzaprine (FLEXERIL) 10 MG tablet Take 1 tablet (10 mg total) by mouth 3 (three) times daily as needed for muscle spasms.   ELIQUIS 5 MG TABS  tablet Take 1 tablet by mouth twice daily   Hydroxocobalamin Acetate 1000 MCG/ML SOLN Inject 1,000 mcg into the muscle 3 (three) times a week.   MAGNESIUM PO Take 12 mg by mouth daily.   metoprolol tartrate (LOPRESSOR) 25 MG tablet Take 1 tablet (25 mg total) by mouth 2 (two) times daily.   Multiple Vitamin (MULTIVITAMIN WITH MINERALS) TABS tablet Take 1 tablet by mouth in the morning.   Zinc 50 MG TABS Take 50 mg by mouth daily.   [DISCONTINUED] acetaminophen (TYLENOL) 325 MG tablet Take 2 tablets (650 mg total) by mouth every 4 (four) hours as needed for moderate pain.    Review of Systems      All other systems reviewed and are otherwise negative except as noted above.  Physical Exam    VS:  BP 96/60   Pulse (!) 101   Ht 6\' 2"  (1.88 m)   Wt 217 lb 9.6 oz (98.7 kg)   SpO2 96%   BMI 27.94 kg/m  , BMI Body mass index is 27.94 kg/m.  Wt Readings from Last 3 Encounters:  04/03/21 217 lb 9.6 oz (98.7 kg)  03/17/21 229 lb 4.8 oz (104 kg)  03/10/21 232 lb (105.2 kg)     GEN: Well nourished, well developed, in no acute distress. HEENT: normal. Neck: Supple, no JVD, carotid bruits, or masses. Cardiac: tachycardic, RRR, no murmurs, rubs, or gallops. No clubbing, cyanosis, edema.  Radials/PT 2+ and equal bilaterally.  Respiratory:  Respirations regular and unlabored, clear to auscultation bilaterally. GI: Soft, nontender, nondistended. MS: No deformity or atrophy. Skin: Warm and dry, no rash. Surgical incisions healing appropriately with well approximated edges and no signs of infection.  Neuro:  Strength and sensation are intact. Psych: Normal affect.  Assessment & Plan    Mitral valve prolapse and severe MR s/p minimally invasive mitral valve repair - He is feeling well after surgery. Upcoming echo for post-surgical monitoring 05/01/21. Surgical incisions healing appropriately.   PAF / Chronic anticoagulation / s/p MAZE - EKG today shows sinus tachycardia with occasional PVC.  Reports no palpitations nor sensation of heart racing. He is relatively hypotensive today but asymptomatic with no lightheadedness, dizziness. Continue current dose Metoprolol 25mg  BID.  Denies bleeding complications on Eliquis. Continue Eliquis 5mg  BID. We discussed that post MAZE and left atrial appendage clipping if he wishes to stop anticoagulation would consider ZIO vs Linq. He shares with me that may prefer long term anticoagulation. Encouraged to discuss with Dr. 05/03/21 at upcoming appointment.   Thoracic aortic aneurysm - Due for follow up MRA 2023. Dilation has been stable since 2014. Continue optimal blood pressure and heart rhythm control.   Previous syncope in setting of COVID 19 - No recurrence. No indication for further evaluation at this time.   Disposition: Follow up in August as scheduled  with Dr. 2024 or APP.  Signed, Alver Sorrow, NP 04/03/2021, 10:15 AM [facility]

## 2021-04-03 ENCOUNTER — Encounter (HOSPITAL_BASED_OUTPATIENT_CLINIC_OR_DEPARTMENT_OTHER): Payer: Self-pay | Admitting: Family

## 2021-04-03 ENCOUNTER — Other Ambulatory Visit: Payer: Self-pay

## 2021-04-03 ENCOUNTER — Ambulatory Visit (INDEPENDENT_AMBULATORY_CARE_PROVIDER_SITE_OTHER): Payer: Medicare Other | Admitting: Family

## 2021-04-03 VITALS — BP 96/60 | HR 101 | Ht 74.0 in | Wt 217.6 lb

## 2021-04-03 DIAGNOSIS — I480 Paroxysmal atrial fibrillation: Secondary | ICD-10-CM | POA: Diagnosis not present

## 2021-04-03 DIAGNOSIS — I340 Nonrheumatic mitral (valve) insufficiency: Secondary | ICD-10-CM

## 2021-04-03 DIAGNOSIS — I712 Thoracic aortic aneurysm, without rupture, unspecified: Secondary | ICD-10-CM

## 2021-04-03 DIAGNOSIS — Z7901 Long term (current) use of anticoagulants: Secondary | ICD-10-CM | POA: Diagnosis not present

## 2021-04-03 DIAGNOSIS — Z9889 Other specified postprocedural states: Secondary | ICD-10-CM

## 2021-04-03 NOTE — Patient Instructions (Addendum)
Medication Instructions:  Continue your current medications.   *If you need a refill on your cardiac medications before your next appointment, please call your pharmacy*  Lab Work: Your physician recommends lab work this week: CBC, BMP   If you have labs (blood work) drawn today and your tests are completely normal, you will receive your results only by: MyChart Message (if you have MyChart) OR A paper copy in the mail If you have any lab test that is abnormal or we need to change your treatment, we will call you to review the results.  Testing/Procedures: You have an echocardiogram upcoming 05/01/21.   Follow-Up: At [facility], you and your health needs are our priority.  As part of our continuing mission to provide you with exceptional heart care, we have created designated Provider Care Teams.  These Care Teams include your primary Cardiologist (physician) and Advanced Practice Providers (APPs -  Physician Assistants and Nurse Practitioners) who all work together to provide you with the care you need, when you need it.  We recommend signing up for the patient portal called "MyChart".  Sign up information is provided on this After Visit Summary.  MyChart is used to connect with patients for Virtual Visits (Telemedicine).  Patients are able to view lab/test results, encounter notes, upcoming appointments, etc.  Non-urgent messages can be sent to your provider as well.   To learn more about what you can do with MyChart, go to [email or web address].    Your next appointment:   As scheduled 04/26/21 with Dr. Jens Som  Other Instructions  Exercise recommendations: The American Heart Association recommends 150 minutes of moderate intensity exercise weekly. Try 30 minutes of moderate intensity exercise 4-5 times per week. This could include walking, jogging, or swimming.  Heart Healthy Diet Recommendations: A low-salt diet is recommended. Meats should be grilled, baked, or boiled.  Avoid fried foods. Focus on lean protein sources like fish or chicken with vegetables and fruits. The American Heart Association is a Chief Technology Officer!

## 2021-04-05 LAB — ECHO INTRAOPERATIVE TEE
AV Mean grad: 2 mmHg
Annulus: 4.6 cm
Ao-asc: 3.4 cm
Height: 74 in
Mean grad: 1 mmHg
STJ: 3.8 cm
Weight: 3710.78 oz

## 2021-04-06 ENCOUNTER — Other Ambulatory Visit: Payer: Self-pay | Admitting: Thoracic Surgery (Cardiothoracic Vascular Surgery)

## 2021-04-06 DIAGNOSIS — Z9889 Other specified postprocedural states: Secondary | ICD-10-CM

## 2021-04-10 ENCOUNTER — Encounter: Payer: Self-pay | Admitting: Physician Assistant

## 2021-04-10 ENCOUNTER — Ambulatory Visit
Admission: RE | Admit: 2021-04-10 | Discharge: 2021-04-10 | Disposition: A | Discharge status: Home / Self Care | Payer: Medicare Other | Source: Ambulatory Visit | Attending: Surgery | Admitting: Surgery

## 2021-04-10 ENCOUNTER — Other Ambulatory Visit: Payer: Self-pay

## 2021-04-10 ENCOUNTER — Ambulatory Visit (INDEPENDENT_AMBULATORY_CARE_PROVIDER_SITE_OTHER): Payer: Self-pay | Admitting: Physician Assistant

## 2021-04-10 VITALS — BP 107/73 | HR 116 | Resp 20 | Ht 74.0 in | Wt 221.0 lb

## 2021-04-10 DIAGNOSIS — Z9889 Other specified postprocedural states: Secondary | ICD-10-CM

## 2021-04-10 DIAGNOSIS — Z952 Presence of prosthetic heart valve: Secondary | ICD-10-CM | POA: Diagnosis not present

## 2021-04-10 NOTE — Progress Notes (Addendum)
[address]       [place] [address]             [phone]       CARDIAC SURGERY POSTOPERATIVE VISIT  Patient Name: Johnny [name] MRN: [id number] DOB: 23-Sep-1955  Subjective: Johnny [name] is a 65 y.o. male with a past medical history of atrial fibrillation and  thoracic aortic aneurysm for post op follow up  after having undergone a minimally invasive mitral valve repair, LA clip, and minimally invasive MAZE by Dr. Cornelius Moras on 03/13/2021. He denies dizziness or shortness of breath. He does have complaints of a pain on top of his right shoulder.  Past Medical History:  Diagnosis Date   Atrial fibrillation (HCC)    Chronic insomnia    Dysrhythmia    afib   Heart murmur    History of rheumatic fever    age 87   Mitral regurgitation    Patent foramen ovale    S/P Maze operation for atrial fibrillation 03/13/2021   Left side atrial lesion set using cryothermy with clipping of LA appendage   S/P minimally-invasive mitral valve repair 03/13/2021   Complex valvuloplasty including artificial PTFE neochord placement x4 with 36 mm Medtronic Simuform ring annuloplasty   Thoracic aortic aneurysm (HCC)    Prior to Admission medications   Medication Sig Start Date End Date Taking? Authorizing Provider  acetaminophen (TYLENOL) 325 MG tablet Take 2 tablets (650 mg total) by mouth every 4 (four) hours as needed. 03/17/21 03/17/22  Leary Roca, PA-C  Cholecalciferol (VITAMIN D3) 250 MCG (10000 UT) capsule Take 10,000 Units by mouth in the morning.    [provider]  cyclobenzaprine (FLEXERIL) 10 MG tablet Take 1 tablet (10 mg total) by mouth 3 (three) times daily as needed for muscle spasms. 03/23/21   Purcell Nails, MD  ELIQUIS 5 MG TABS tablet Take 1 tablet by mouth twice daily 06/16/20   Lewayne Bunting, MD  Hydroxocobalamin Acetate 1000 MCG/ML SOLN Inject 1,000 mcg into the muscle 3 (three) times a week.    [provider]  MAGNESIUM PO Take 12  mg by mouth daily.    [provider]  metoprolol tartrate (LOPRESSOR) 25 MG tablet Take 1 tablet (25 mg total) by mouth 2 (two) times daily. 03/17/21   Leary Roca, PA-C  Multiple Vitamin (MULTIVITAMIN WITH MINERALS) TABS tablet Take 1 tablet by mouth in the morning.    [provider]  Zinc 50 MG TABS Take 50 mg by mouth daily.    [provider]   Today's Vitals   04/10/21 1304  BP: 107/73  Pulse: (!) 116  Resp: 20  SpO2: 95%  Weight: 221 lb (100.2 kg)  Height: 6\' 2"  (1.88 m)   Body mass index is 28.37 kg/m.   Physical Exam:  GENERAL: Well-nourished, well-developed, in no acute distress CARDIOVASCULAR: Tachycardic but appears regular. No murmurs/rubs/gallops.  RESPIRATORY: Respiratory effort is normal. Lungs clear to auscultation. ABDOMEN: Bowel sounds present. No masses or tenderness. WOUNDS: Right anterior wound and right groin wounds are clean and dry. There are 2 eschars where chest tubes were present. No sign of infection  Imaging Studies: CLINICAL DATA:  MVR   EXAM: CHEST - 2 VIEW   COMPARISON:  03/17/2021   FINDINGS: No significant change in chest radiographs, with bandlike scarring or atelectasis of the right midlung and elevation of the right hemidiaphragm. No acute airspace opacity. Mitral valve prosthesis  and left atrial appendage clip. Osseous structures are unremarkable.   IMPRESSION: 1. No significant change in chest radiographs, with bandlike scarring or atelectasis of the right midlung and elevation of the right hemidiaphragm. No acute airspace opacity.   2.  Mitral valve prosthesis and left atrial appendage clip.     Electronically Signed   By: Lauralyn Primes M.D.   On: 04/10/2021 13:23  Impression/Plan: Overall, Mr Yang is recovering well from minimally invasive mitral valve repair, MAZE, and LA clip. He has already seen the cardiology NP in follow up on 04/03/2021. EKG at that time showed ST with occasional PVCs.  There were no changes made to his medications. His rate is a little fast at this office visit. No EKG is able to be taken however, the rate feels regular just fast. I will not titrate Lopressor at this time. If he consistently becomes tachycardic or back in a fib, we will address accordingly. Regarding pain on top of his right shoulder, it appears this may be positional from sleeping. He was instructed he may sleep on either side or his front or back as he is mostly sleeping on one side. Patient is not taking narcotic for pain and he is already driving. Cardiac rehab has contacted patient but patient likely will defer at this time. He was instructed he may go swimming and bathe once eschars are no longer present on chest tube wounds. He may continue to shower and use soap and water on these wounds. If he develops redness or drainage, to call the office to be seen. He was instructed to gradually increase his activity as tolerates and to slowly start weights, if he chooses to do so. We discussed endocarditis and the importance of receiving an antibiotic prior to any dental procedure. Of note, he has an aneurysmal dilatation of the aortic arch up to 4.9 cm and the ascending thoracic aorta is 3.7 cm. He states Dr. Jens Som has been following this for years. He has a follow up appointment to see Dr. Jens Som on 04/26/2021 and a follow up echo 08/15. As discussed with surgeon, patient will need a scan and follow up within 6 months. I will discuss with Dr. Jens Som whether he wants to continue to follow or one of [facility] surgeons will follow aneurysmal dilatation of the aortic arch etc.   Doree Fudge, PA-C 04/10/2021 12:37 PM

## 2021-04-10 NOTE — Patient Instructions (Signed)
Endocarditis is a potentially serious infection of heart valves or inside lining of the heart.  It occurs more commonly in patients with diseased heart valves (such as patient's with aortic or mitral valve disease) and in patients who have undergone heart valve repair or replacement.  Certain surgical and dental procedures may put you at risk, such as dental cleaning, other dental procedures, or any surgery involving the respiratory, urinary, gastrointestinal tract, gallbladder or prostate gland.   To minimize your chances for develooping endocarditis, maintain good oral health and seek prompt medical attention for any infections involving the mouth, teeth, gums, skin or urinary tract.    Always notify your doctor or dentist about your underlying heart valve condition before having any invasive procedures. You will need to take antibiotics before certain procedures, including all routine dental cleanings or other dental procedures.  Your cardiologist or dentist should prescribe these antibiotics for you to be taken ahead of time.  You may return to driving an automobile as long as you are no longer requiring oral narcotic pain relievers during the daytime.  It would be wise to start driving only short distances during the daylight and gradually increase from there as you feel comfortable.   You are encouraged to enroll and participate in the outpatient cardiac rehab program beginning as soon as practical.

## 2021-04-12 DIAGNOSIS — I340 Nonrheumatic mitral (valve) insufficiency: Secondary | ICD-10-CM | POA: Diagnosis not present

## 2021-04-12 DIAGNOSIS — Z7901 Long term (current) use of anticoagulants: Secondary | ICD-10-CM | POA: Diagnosis not present

## 2021-04-12 DIAGNOSIS — I480 Paroxysmal atrial fibrillation: Secondary | ICD-10-CM | POA: Diagnosis not present

## 2021-04-13 LAB — CBC
Hematocrit: 44.3 % (ref 37.5–51.0)
Hemoglobin: 14.8 g/dL (ref 13.0–17.7)
MCH: 30.1 pg (ref 26.6–33.0)
MCHC: 33.4 g/dL (ref 31.5–35.7)
MCV: 90 fL (ref 79–97)
Platelets: 242 10*3/uL (ref 150–450)
RBC: 4.91 x10E6/uL (ref 4.14–5.80)
RDW: 13 % (ref 11.6–15.4)
WBC: 8.4 10*3/uL (ref 3.4–10.8)

## 2021-04-13 LAB — BASIC METABOLIC PANEL
BUN/Creatinine Ratio: 16 (ref 10–24)
BUN: 16 mg/dL (ref 8–27)
CO2: 22 mmol/L (ref 20–29)
Calcium: 9.8 mg/dL (ref 8.6–10.2)
Chloride: 103 mmol/L (ref 96–106)
Creatinine, Ser: 1 mg/dL (ref 0.76–1.27)
Glucose: 97 mg/dL (ref 65–99)
Potassium: 4.5 mmol/L (ref 3.5–5.2)
Sodium: 139 mmol/L (ref 134–144)
eGFR: 83 mL/min/{1.73_m2} (ref >59)

## 2021-04-17 NOTE — Progress Notes (Signed)
HPI: FU atrial fibrillation, thoracic aortic aneurysm and MV repair. Transesophageal echocardiogram May 2022 showed normal LV function, mildly dilated aortic root at 44 mm, flail P2 segment of posterior mitral valve leaflet with severe eccentric mitral regurgitation, moderate left atrial enlargement.  Cardiac catheterization May 2022 showed no obstructive coronary disease.  Carotid Dopplers June 2022 showed minimal plaque bilaterally.  CTA June 2022 showed thoracic aortic aneurysm at the sinuses of Valsalva measuring 49 mm, probable patent foramen ovale.  No abdominal aortic aneurysm noted.  Patient had mitral valve repair, Maze procedure, left atrial appendage clipping June 2022.  Since he was last seen, the patient denies any dyspnea on exertion, orthopnea, PND, pedal edema, palpitations, syncope or chest pain.   Current Outpatient Medications  Medication Sig Dispense Refill   Cholecalciferol (VITAMIN D3) 250 MCG (10000 UT) capsule Take 10,000 Units by mouth in the morning.     ELIQUIS 5 MG TABS tablet Take 1 tablet by mouth twice daily 180 tablet 1   Hydroxocobalamin Acetate 1000 MCG/ML SOLN Inject 1,000 mcg into the muscle 3 (three) times a week.     MAGNESIUM PO Take 12 mg by mouth daily.     metoprolol tartrate (LOPRESSOR) 25 MG tablet Take 1 tablet (25 mg total) by mouth 2 (two) times daily. 60 tablet 2   Multiple Vitamin (MULTIVITAMIN WITH MINERALS) TABS tablet Take 1 tablet by mouth in the morning.     Zinc 50 MG TABS Take 50 mg by mouth daily.     acetaminophen (TYLENOL) 325 MG tablet Take 2 tablets (650 mg total) by mouth every 4 (four) hours as needed. 100 tablet 2   cyclobenzaprine (FLEXERIL) 10 MG tablet Take 1 tablet (10 mg total) by mouth 3 (three) times daily as needed for muscle spasms. 30 tablet 0   No current facility-administered medications for this visit.     Past Medical History:  Diagnosis Date   Atrial fibrillation (HCC)    Chronic insomnia    Dysrhythmia     afib   Heart murmur    History of rheumatic fever    age 65   Mitral regurgitation    Patent foramen ovale    S/P Maze operation for atrial fibrillation 03/13/2021   Left side atrial lesion set using cryothermy with clipping of LA appendage   S/P minimally-invasive mitral valve repair 03/13/2021   Complex valvuloplasty including artificial PTFE neochord placement x4 with 36 mm Medtronic Simuform ring annuloplasty   Thoracic aortic aneurysm [facility])     Past Surgical History:  Procedure Laterality Date   CARDIAC CATHETERIZATION  02/10/2021   CARDIOVERSION     CLIPPING OF ATRIAL APPENDAGE  03/13/2021   Procedure: CLIPPING OF ATRIAL APPENDAGE  USING ATRICURE CLIP PRO2 SIZE ;  Surgeon: Purcell Nails, MD;  Location: [facility] OR;  Service: Open Heart Surgery;;   MAZE N/A 03/13/2021   Procedure: MAZE;  Surgeon: Purcell Nails, MD;  Location: [facility] OR;  Service: Open Heart Surgery;  Laterality: N/A;   MITRAL VALVE REPAIR Right 03/13/2021   Procedure: MINIMALLY INVASIVE MITRAL VALVE REPAIR (MVR) USING MEDTRONIC SIMUFORM RING SIZE ;  Surgeon: Purcell Nails, MD;  Location[facility] OR;  Service: Open Heart Surgery;  Laterality: Right;   RIGHT/LEFT HEART CATH AND CORONARY ANGIOGRAPHY N/A 02/10/2021   Procedure: RIGHT/LEFT HEART CATH AND CORONARY ANGIOGRAPHY;  Surgeon: Tonny Bollman, MD;  Location: [facility] INVASIVE CV LAB;  Service: Cardiovascular;  Laterality: N/A;   TEE WITHOUT CARDIOVERSION N/A  01/16/2021   Procedure: TRANSESOPHAGEAL ECHOCARDIOGRAM (TEE);  Surgeon: Lewayne Bunting, MD;  Location: [facility] ENDOSCOPY;  Service: Cardiovascular;  Laterality: N/A;   TEE WITHOUT CARDIOVERSION N/A 03/13/2021   Procedure: TRANSESOPHAGEAL ECHOCARDIOGRAM (TEE);  Surgeon: Purcell Nails, MD;  Location: [facility] OR;  Service: Open Heart Surgery;  Laterality: N/A;    Social History   Socioeconomic History   Marital status: Married    Spouse name: Not on file   Number of children: Not on file   Years of education: Not  on file   Highest education level: Not on file  Occupational History   Not on file  Tobacco Use   Smoking status: Never   Smokeless tobacco: Never  Vaping Use   Vaping Use: Never used  Substance and Sexual Activity   Alcohol use: No   Drug use: No   Sexual activity: Yes  Other Topics Concern   Not on file  Social History Narrative          [employer]   Married   2 daughters ages 17, 34      Social Determinants of Health   Financial Resource Strain: Not on file  Food Insecurity: Not on file  Transportation Needs: Not on file  Physical Activity: Not on file  Stress: Not on file  Social Connections: Not on file  Intimate Partner Violence: Not on file    Family History  Problem Relation Age of Onset   Pulmonary fibrosis Mother    Diverticulosis Father     ROS: no fevers or chills, productive cough, hemoptysis, dysphasia, odynophagia, melena, hematochezia, dysuria, hematuria, rash, seizure activity, orthopnea, PND, pedal edema, claudication. Remaining systems are negative.  Physical Exam: Well-developed well-nourished in no acute distress.  Skin is warm and dry.  HEENT is normal.  Neck is supple.  Chest is clear to auscultation with normal expansion.  Cardiovascular exam is regular rate and rhythm.  Abdominal exam nontender or distended. No masses palpated. Extremities show no edema. neuro grossly intact  ECG-sinus tachycardia at a rate of 105, nonspecific ST changes.  Personally reviewed  A/P  1 status post mitral valve repair-we discussed the importance of SBE prophylaxis.  We will arrange a baseline echo following recent surgery.  2 thoracic aortic aneurysm-patient's thoracic aneurysm has not changed in size.  We will plan to repeat CTA June 2023.  3 history of paroxysmal atrial fibrillation-patient is now status post Maze procedure and left atrial appendage clipping.  He remains in sinus rhythm.  We will continue apixaban for now.  When I  see him back in 6 months if he remains in sinus rhythm with no recurrences we will consider discontinuing at that time.  4 history of syncope-patient has had no recurrences and previous episode was felt secondary to dehydration with possible vagal contribution.  Olga Millers, MD

## 2021-04-25 DIAGNOSIS — I499 Cardiac arrhythmia, unspecified: Secondary | ICD-10-CM | POA: Insufficient documentation

## 2021-04-25 DIAGNOSIS — Q211 Atrial septal defect: Secondary | ICD-10-CM | POA: Insufficient documentation

## 2021-04-25 DIAGNOSIS — Q2112 Patent foramen ovale: Secondary | ICD-10-CM | POA: Insufficient documentation

## 2021-04-25 DIAGNOSIS — R011 Cardiac murmur, unspecified: Secondary | ICD-10-CM | POA: Insufficient documentation

## 2021-04-26 ENCOUNTER — Other Ambulatory Visit: Payer: Self-pay

## 2021-04-26 ENCOUNTER — Ambulatory Visit: Payer: Medicare Other | Admitting: Cardiology

## 2021-04-26 ENCOUNTER — Encounter: Payer: Self-pay | Admitting: Cardiology

## 2021-04-26 VITALS — BP 90/74 | HR 105 | Ht 74.0 in | Wt 221.4 lb

## 2021-04-26 DIAGNOSIS — I480 Paroxysmal atrial fibrillation: Secondary | ICD-10-CM | POA: Diagnosis not present

## 2021-04-26 DIAGNOSIS — I712 Thoracic aortic aneurysm, without rupture, unspecified: Secondary | ICD-10-CM

## 2021-04-26 DIAGNOSIS — Z9889 Other specified postprocedural states: Secondary | ICD-10-CM

## 2021-04-26 NOTE — Patient Instructions (Signed)

## 2021-05-01 ENCOUNTER — Ambulatory Visit (HOSPITAL_COMMUNITY): Payer: Medicare Other | Attending: Internal Medicine

## 2021-05-01 ENCOUNTER — Other Ambulatory Visit: Payer: Self-pay

## 2021-05-01 DIAGNOSIS — Z9889 Other specified postprocedural states: Secondary | ICD-10-CM | POA: Insufficient documentation

## 2021-05-01 DIAGNOSIS — Z954 Presence of other heart-valve replacement: Secondary | ICD-10-CM

## 2021-05-01 DIAGNOSIS — I340 Nonrheumatic mitral (valve) insufficiency: Secondary | ICD-10-CM | POA: Insufficient documentation

## 2021-05-02 LAB — ECHOCARDIOGRAM COMPLETE
Area-P 1/2: 4.41 cm2
MV VTI: 3.61 cm2
S' Lateral: 2.8 cm

## 2021-05-05 ENCOUNTER — Encounter: Payer: Self-pay | Admitting: *Deleted

## 2021-05-16 ENCOUNTER — Encounter: Payer: Self-pay | Admitting: Medical

## 2021-05-16 ENCOUNTER — Telehealth: Payer: Self-pay | Admitting: Medical

## 2021-05-16 DIAGNOSIS — R5383 Other fatigue: Secondary | ICD-10-CM

## 2021-05-16 DIAGNOSIS — Z8639 Personal history of other endocrine, nutritional and metabolic disease: Secondary | ICD-10-CM

## 2021-05-16 NOTE — Telephone Encounter (Signed)
Future lab placed  

## 2021-05-18 ENCOUNTER — Other Ambulatory Visit: Payer: Self-pay

## 2021-05-18 ENCOUNTER — Other Ambulatory Visit (INDEPENDENT_AMBULATORY_CARE_PROVIDER_SITE_OTHER): Payer: Medicare Other

## 2021-05-18 DIAGNOSIS — Z8639 Personal history of other endocrine, nutritional and metabolic disease: Secondary | ICD-10-CM | POA: Diagnosis not present

## 2021-05-18 DIAGNOSIS — R5383 Other fatigue: Secondary | ICD-10-CM

## 2021-05-18 LAB — COMPREHENSIVE METABOLIC PANEL
ALT: 17 U/L (ref 0–53)
AST: 20 U/L (ref 0–37)
Albumin: 3.6 g/dL (ref 3.5–5.2)
Alkaline Phosphatase: 98 U/L (ref 39–117)
BUN: 17 mg/dL (ref 6–23)
CO2: 28 mEq/L (ref 19–32)
Calcium: 10 mg/dL (ref 8.4–10.5)
Chloride: 103 mEq/L (ref 96–112)
Creatinine, Ser: 0.91 mg/dL (ref 0.40–1.50)
GFR: 87.51 mL/min (ref >60.00)
Glucose, Bld: 95 mg/dL (ref 70–99)
Potassium: 5.2 mEq/L — ABNORMAL HIGH (ref 3.5–5.1)
Sodium: 138 mEq/L (ref 135–145)
Total Bilirubin: 0.7 mg/dL (ref 0.2–1.2)
Total Protein: 6.7 g/dL (ref 6.0–8.3)

## 2021-05-18 LAB — CBC WITH DIFFERENTIAL/PLATELET
Basophils Absolute: 0 10*3/uL (ref 0.0–0.1)
Basophils Relative: 0.2 % (ref 0.0–3.0)
Eosinophils Absolute: 0.2 10*3/uL (ref 0.0–0.7)
Eosinophils Relative: 1.7 % (ref 0.0–5.0)
HCT: 41.7 % (ref 39.0–52.0)
Hemoglobin: 13.8 g/dL (ref 13.0–17.0)
Lymphocytes Relative: 12 % (ref 12.0–46.0)
Lymphs Abs: 1.2 10*3/uL (ref 0.7–4.0)
MCHC: 33 g/dL (ref 30.0–36.0)
MCV: 90.9 fl (ref 78.0–100.0)
Monocytes Absolute: 1 10*3/uL (ref 0.1–1.0)
Monocytes Relative: 9.5 % (ref 3.0–12.0)
Neutro Abs: 7.7 10*3/uL (ref 1.4–7.7)
Neutrophils Relative %: 76.6 % (ref 43.0–77.0)
Platelets: 352 10*3/uL (ref 150.0–400.0)
RBC: 4.58 Mil/uL (ref 4.22–5.81)
RDW: 14.3 % (ref 11.5–15.5)
WBC: 10.1 10*3/uL (ref 4.0–10.5)

## 2021-05-18 LAB — LIPID PANEL
Cholesterol: 163 mg/dL (ref 0–200)
HDL: 38.9 mg/dL — ABNORMAL LOW (ref >39.00)
LDL Cholesterol: 106 mg/dL — ABNORMAL HIGH (ref 0–99)
NonHDL: 123.71
Total CHOL/HDL Ratio: 4
Triglycerides: 88 mg/dL (ref 0.0–149.0)
VLDL: 17.6 mg/dL (ref 0.0–40.0)

## 2021-05-18 LAB — VITAMIN B12: Vitamin B-12: >1550 pg/mL — ABNORMAL HIGH (ref 211–911)

## 2021-05-18 LAB — TSH: TSH: 0.98 u[IU]/mL (ref 0.35–5.50)

## 2021-05-23 ENCOUNTER — Ambulatory Visit (INDEPENDENT_AMBULATORY_CARE_PROVIDER_SITE_OTHER): Payer: Medicare Other | Admitting: Medical

## 2021-05-23 ENCOUNTER — Other Ambulatory Visit: Payer: Self-pay

## 2021-05-23 VITALS — BP 122/62 | HR 102 | Temp 98.2°F | Resp 18 | Ht 74.0 in | Wt 224.0 lb

## 2021-05-23 DIAGNOSIS — I480 Paroxysmal atrial fibrillation: Secondary | ICD-10-CM

## 2021-05-23 DIAGNOSIS — Z8679 Personal history of other diseases of the circulatory system: Secondary | ICD-10-CM

## 2021-05-23 DIAGNOSIS — R748 Abnormal levels of other serum enzymes: Secondary | ICD-10-CM | POA: Diagnosis not present

## 2021-05-23 DIAGNOSIS — E538 Deficiency of other specified B group vitamins: Secondary | ICD-10-CM

## 2021-05-23 DIAGNOSIS — E875 Hyperkalemia: Secondary | ICD-10-CM | POA: Diagnosis not present

## 2021-05-23 NOTE — Progress Notes (Signed)
Subjective:    Patient ID: Johnny [name], male    DOB: Nov 08, 1955, 65 y.o.   MRN: [id number]  HPI  Pt sent me note reminder on labs that I had ordered.   Hx of low b12 in past. Pt states told chromosome issue in the past. Told needed injections.Pt does injection Sunday, Tuesday and Thursday. 1cc. Has been on for 10-15 years.  Mild high potassium. Advised to cut back.   Hx of high cholesterol. LDL high and low hdl.  Thyroid levels are normal.  Pt in for follow up. Pt seen by cardiothoracic surgeon and cardiologist.  "Impression/Plan: Surgeon office appointment. Overall, Mr Lurz is recovering well from minimally invasive mitral valve repair, MAZE, and LA clip. He has already seen the cardiology NP in follow up on 04/03/2021. EKG at that time showed ST with occasional PVCs. There were no changes made to his medications. His rate is a little fast at this office visit. No EKG is able to be taken however, the rate feels regular just fast. I will not titrate Lopressor at this time. If he consistently becomes tachycardic or back in a fib, we will address accordingly. Regarding pain on top of his right shoulder, it appears this may be positional from sleeping. He was instructed he may sleep on either side or his front or back as he is mostly sleeping on one side. Patient is not taking narcotic for pain and he is already driving. Cardiac rehab has contacted patient but patient likely will defer at this time. He was instructed he may go swimming and bathe once eschars are no longer present on chest tube wounds. He may continue to shower and use soap and water on these wounds. If he develops redness or drainage, to call the office to be seen. He was instructed to gradually increase his activity as tolerates and to slowly start weights, if he chooses to do so. We discussed endocarditis and the importance of receiving an antibiotic prior to any dental procedure. Of note, he has an aneurysmal dilatation of  the aortic arch up to 4.9 cm and the ascending thoracic aorta is 3.7 cm. He states Dr. Jens Som has been following this for years. He has a follow up appointment to see Dr. Jens Som on 04/26/2021 and a follow up echo 08/15. As discussed with surgeon, patient will need a scan and follow up within 6 months. I will discuss with Dr. Jens Som whether he wants to continue to follow or one of [facility] surgeons will follow aneurysmal dilatation of the aortic arch etc."  Cardiologist appointment note.  "1 status post mitral valve repair-we discussed the importance of SBE prophylaxis.  We will arrange a baseline echo following recent surgery.   2 thoracic aortic aneurysm-patient's thoracic aneurysm has not changed in size.  We will plan to repeat CTA June 2023.   3 history of paroxysmal atrial fibrillation-patient is now status post Maze procedure and left atrial appendage clipping.  He remains in sinus rhythm.  We will continue apixaban for now.  When I see him back in 6 months if he remains in sinus rhythm with no recurrences we will consider discontinuing at that time.   4 history of syncope-patient has had no recurrences and previous episode was felt secondary to dehydration with possible vagal contribution."    Pt is still on metroprolol and eliquis.  Pt states cardiologist has him on metoprolol planned for 90 days. Pt has some dizziness with metoprolol. No low bp. Pt pulse  in past ran between 100-110 historically.   Pt has been advised to be cautious on changing position before ambulating.  Pt may reach out and get advise on whether he can stop metoprolol.      Review of Systems  Constitutional:  Negative for chills, fatigue and fever.  HENT:  Negative for congestion, drooling, ear discharge and ear pain.   Respiratory:  Negative for cough, chest tightness, shortness of breath and wheezing.   Cardiovascular:  Negative for chest pain and palpitations.  Gastrointestinal:  Negative for abdominal  pain.  Musculoskeletal:  Negative for back pain, gait problem, myalgias and neck pain.       Brief myalgia 2 weeks ago but resolved quickly.  Skin:  Negative for rash.  Neurological:  Negative for dizziness, syncope, speech difficulty, weakness, numbness and headaches.  Hematological:  Negative for adenopathy. Does not bruise/bleed easily.  Psychiatric/Behavioral:  Negative for behavioral problems and hallucinations. The patient is not hyperactive.     Past Medical History:  Diagnosis Date   Atrial fibrillation (HCC)    Chronic insomnia    Dysrhythmia    afib   Heart murmur    History of rheumatic fever    age 65   Mitral regurgitation    Patent foramen ovale    S/P Maze operation for atrial fibrillation 03/13/2021   Left side atrial lesion set using cryothermy with clipping of LA appendage   S/P minimally-invasive mitral valve repair 03/13/2021   Complex valvuloplasty including artificial PTFE neochord placement x4 with 36 mm Medtronic Simuform ring annuloplasty   Thoracic aortic aneurysm (HCC)      Social History   Socioeconomic History   Marital status: Married    Spouse name: Not on file   Number of children: Not on file   Years of education: Not on file   Highest education level: Not on file  Occupational History   Not on file  Tobacco Use   Smoking status: Never   Smokeless tobacco: Never  Vaping Use   Vaping Use: Never used  Substance and Sexual Activity   Alcohol use: No   Drug use: No   Sexual activity: Yes  Other Topics Concern   Not on file  Social History Narrative          [employer]   Married   2 daughters ages 69, 80      Social Determinants of Health   Financial Resource Strain: Not on file  Food Insecurity: Not on file  Transportation Needs: Not on file  Physical Activity: Not on file  Stress: Not on file  Social Connections: Not on file  Intimate Partner Violence: Not on file    Past Surgical History:  Procedure  Laterality Date   CARDIAC CATHETERIZATION  02/10/2021   CARDIOVERSION     CLIPPING OF ATRIAL APPENDAGE  03/13/2021   Procedure: CLIPPING OF ATRIAL APPENDAGE  USING ATRICURE CLIP PRO2 SIZE ;  Surgeon: Purcell Nails, MD;  Location[facility] OR;  Service: Open Heart Surgery;;   MAZE N/A 03/13/2021   Procedure: MAZE;  Surgeon: Purcell Nails, MD;  Location: [facility] OR;  Service: Open Heart Surgery;  Laterality: N/A;   MITRAL VALVE REPAIR Right 03/13/2021   Procedure: MINIMALLY INVASIVE MITRAL VALVE REPAIR (MVR) USING MEDTRONIC SIMUFORM RING SIZE ;  Surgeon: Purcell Nails, MD;  Location: [facility] OR;  Service: Open Heart Surgery;  Laterality: Right;   RIGHT/LEFT HEART CATH AND CORONARY ANGIOGRAPHY N/A 02/10/2021   Procedure:  RIGHT/LEFT HEART CATH AND CORONARY ANGIOGRAPHY;  Surgeon: Tonny Bollman, MD;  Location: [facility] INVASIVE CV LAB;  Service: Cardiovascular;  Laterality: N/A;   TEE WITHOUT CARDIOVERSION N/A 01/16/2021   Procedure: TRANSESOPHAGEAL ECHOCARDIOGRAM (TEE);  Surgeon: Lewayne Bunting, MD;  Location: [facility] ENDOSCOPY;  Service: Cardiovascular;  Laterality: N/A;   TEE WITHOUT CARDIOVERSION N/A 03/13/2021   Procedure: TRANSESOPHAGEAL ECHOCARDIOGRAM (TEE);  Surgeon: Purcell Nails, MD;  Location: [facility] OR;  Service: Open Heart Surgery;  Laterality: N/A;    Family History  Problem Relation Age of Onset   Pulmonary fibrosis Mother    Diverticulosis Father     Allergies  Allergen Reactions   Bee Venom Anaphylaxis   Antihistamines, Chlorpheniramine-Type Other (See Comments)    Acts as a depressant with pt.    Current Outpatient Medications on File Prior to Visit  Medication Sig Dispense Refill   Cholecalciferol (VITAMIN D3) 250 MCG (10000 UT) capsule Take 10,000 Units by mouth in the morning.     ELIQUIS 5 MG TABS tablet Take 1 tablet by mouth twice daily 180 tablet 1   Hydroxocobalamin Acetate 1000 MCG/ML SOLN Inject 1,000 mcg into the muscle 3 (three) times a week.     MAGNESIUM PO Take 12  mg by mouth daily.     metoprolol tartrate (LOPRESSOR) 25 MG tablet Take 1 tablet (25 mg total) by mouth 2 (two) times daily. 60 tablet 2   Multiple Vitamin (MULTIVITAMIN WITH MINERALS) TABS tablet Take 1 tablet by mouth in the morning.     Zinc 50 MG TABS Take 50 mg by mouth daily.     No current facility-administered medications on file prior to visit.    BP 122/62 (BP Location: Right Arm, Patient Position: Sitting, Cuff Size: Normal)   Pulse (!) 102   Temp 98.2 F (36.8 C) (Oral)   Resp 18   Ht 6\' 2"  (1.88 m)   Wt 224 lb (101.6 kg)   SpO2 96%   BMI 28.76 kg/m       Objective:   Physical Exam   General Mental Status- Alert. General Appearance- Not in acute distress.   Skin General: Color- Normal Color. Moisture- Normal Moisture.  Neck Carotid Arteries- Normal color. Moisture- Normal Moisture. No carotid bruits. No JVD.  Chest and Lung Exam Auscultation: Breath Sounds:-Normal.  Cardiovascular Auscultation:Rythm- Regular. Murmurs & Other Heart Sounds:Auscultation of the heart reveals- No Murmurs.  Abdomen Inspection:-Inspeection Normal. Palpation/Percussion:Note:No mass. Palpation and Percussion of the abdomen reveal- Non Tender, Non Distended + BS, no rebound or guarding.   Neurologic Cranial Nerve exam:- CN III-XII intact(No nystagmus), symmetric smile. Strength:- 5/5 equal and symmetric strength both upper and lower extremities.   Lower ext- no pedal edema.      Assessment & Plan:   Patient Instructions  History of paroxysmal atrial fibrillation-patient is now status post Maze procedure and left atrial appendage clipping.  He remains in sinus rhythm.  We will continue apixaban for now.   Thoracic aortic aneurysm-patient's thoracic aneurysm has not changed in size.  We will plan to repeat CTA June 2023.  Status post mitral valve repair-we discussed the importance of SBE prophylaxis.   Hyperlipidemia but ct coronary score was 0. Advise low cholesterol  diet.  B12 high. Advise skip b12 dose on Sunday. Recheck b12 on Monday.  High potassium on lab. Avoid high potassium foods. Repeat cmp/k level on Monday.  Follow b12 and cmp on Monday. Follow up date to be determined after lab review.   Monday,  PA-C    Time spent with patient today was  41 minutes which consisted of chart review, discussing diagnosis, work up treatment and documentation. Also answered all questions.

## 2021-05-23 NOTE — Patient Instructions (Addendum)
History of paroxysmal atrial fibrillation-patient is now status post Maze procedure and left atrial appendage clipping.  He remains in sinus rhythm.  We will continue apixaban for now.   Get opinion from cardiologist on if can stop b blocker in future.(Consider calcium channel blocker in place of b blocker if pulse still little high)  Thoracic aortic aneurysm-patient's thoracic aneurysm has not changed in size.  We will plan to repeat CTA June 2023.  Status post mitral valve repair-we discussed the importance of SBE prophylaxis.   Hyperlipidemia but ct coronary score was 0. Advise low cholesterol diet.  B12 high. Advise skip b12 dose on Sunday. Recheck b12 on Monday.  High potassium on lab. Avoid high potassium foods. Repeat cmp/k level on Monday.  Follow b12 and cmp on Monday. Follow up date to be determined after lab review.  If recurrent severe myalgia occur randomly let us know and would do inflammatory lab.  Regarding pt genetic mutation hx.  His my chart message later below. It is my understanding that MTHFR is a genetic variant or SNP (single nucleotide polymorphism) that can have significant consequences on health.  Our goal has been to supplement the B-12 to overcome the issues MTHFR causes due issues in processing Folic Acids.        We are supplementing B-12 due to MTHFR  Double Genetic Mutation.  The Hydroxocobalamin Acetate injections assist with metabolizing folate and breaking down the amino acid homocysteine. Vitamin B12 also plays a major role as a cofactor in the methylation process of folate and the conversion of homocysteine to methionine.        Research indicates that most people who have MTHFR benefit from taking methylcobalamin (methylated B12). In addition,  it is helpful to consume more natural forms of B12, folate, and vitamin B6 (also important in the process of detoxification), such as beans, lentils, asparagus, broccoli, and avocado.        I have been following  this general MTHFR management protocol as defined by genetic counseling for over 10 years.

## 2021-05-24 ENCOUNTER — Encounter: Payer: Self-pay | Admitting: Medical

## 2021-05-26 ENCOUNTER — Telehealth: Payer: Self-pay | Admitting: *Deleted

## 2021-05-26 NOTE — Telephone Encounter (Signed)
Pt has lab appointment on Monday. Schedule notes say b12 and cmp.  Pt has multiple future lab orders dated 02/17/21.  Please review those orders and advise if all of those need to be collected on Monday.

## 2021-05-26 NOTE — Addendum Note (Signed)
Addended by: Gwenevere Abbot on: 05/26/2021 09:57 PM   Modules accepted: Orders

## 2021-05-29 ENCOUNTER — Other Ambulatory Visit: Payer: Self-pay

## 2021-05-29 ENCOUNTER — Other Ambulatory Visit (INDEPENDENT_AMBULATORY_CARE_PROVIDER_SITE_OTHER): Payer: Medicare Other

## 2021-05-29 DIAGNOSIS — E875 Hyperkalemia: Secondary | ICD-10-CM

## 2021-05-29 DIAGNOSIS — R748 Abnormal levels of other serum enzymes: Secondary | ICD-10-CM

## 2021-05-29 LAB — COMPREHENSIVE METABOLIC PANEL
ALT: 12 U/L (ref 0–53)
AST: 13 U/L (ref 0–37)
Albumin: 3.8 g/dL (ref 3.5–5.2)
Alkaline Phosphatase: 100 U/L (ref 39–117)
BUN: 15 mg/dL (ref 6–23)
CO2: 26 mEq/L (ref 19–32)
Calcium: 9.8 mg/dL (ref 8.4–10.5)
Chloride: 103 mEq/L (ref 96–112)
Creatinine, Ser: 0.88 mg/dL (ref 0.40–1.50)
GFR: 89.26 mL/min (ref >60.00)
Glucose, Bld: 100 mg/dL — ABNORMAL HIGH (ref 70–99)
Potassium: 4.8 mEq/L (ref 3.5–5.1)
Sodium: 139 mEq/L (ref 135–145)
Total Bilirubin: 0.9 mg/dL (ref 0.2–1.2)
Total Protein: 6.7 g/dL (ref 6.0–8.3)

## 2021-05-29 LAB — VITAMIN B12: Vitamin B-12: >1550 pg/mL — ABNORMAL HIGH (ref 211–911)

## 2021-05-29 NOTE — Telephone Encounter (Signed)
June orders deleted as duplicate per PCP.

## 2021-05-30 ENCOUNTER — Encounter: Payer: Self-pay | Admitting: Medical

## 2021-07-18 ENCOUNTER — Telehealth: Payer: Self-pay | Admitting: Medical

## 2021-07-18 NOTE — Telephone Encounter (Signed)
Left message for patient to call back and schedule Medicare Annual Wellness Visit (AWV) in office.  ° °If not able to come in office, please offer to do virtually or by telephone.  Left office number and my jabber #[phone]. ° °Due for AWVI ° °Please schedule at anytime with Nurse Health Advisor. °  °

## 2021-09-04 ENCOUNTER — Telehealth: Payer: Self-pay | Admitting: Medical

## 2021-09-04 NOTE — Telephone Encounter (Signed)
Left message for patient to call back and schedule Medicare Annual Wellness Visit (AWV) in office.  ° °If not able to come in office, please offer to do virtually or by telephone.  Left office number and my jabber #[phone]. ° °Due for AWVI ° °Please schedule at anytime with Nurse Health Advisor. °  °

## 2021-10-26 NOTE — Progress Notes (Signed)
HPI: FU atrial fibrillation, thoracic aortic aneurysm and MV repair. Transesophageal echocardiogram May 2022 showed normal LV function, mildly dilated aortic root at 44 mm, flail P2 segment of posterior mitral valve leaflet with severe eccentric mitral regurgitation, moderate left atrial enlargement.  Cardiac catheterization May 2022 showed no obstructive coronary disease.  Carotid Dopplers June 2022 showed minimal plaque bilaterally.  CTA June 2022 showed thoracic aortic aneurysm at the sinuses of Valsalva measuring 49 mm, probable patent foramen ovale.  No abdominal aortic aneurysm noted.  Patient had mitral valve repair, Maze procedure, left atrial appendage clipping June 2022.  Echo 8/22 showed EF 50-55, mild LVE, mild left atrial enlargement, status post mitral valve repair with no residual mitral regurgitation and mean gradient 3 mmHg, mildly dilated aortic root at 41 mm.  Since he was last seen, there is no dyspnea, exertional chest pain, palpitations or syncope.  Current Outpatient Medications  Medication Sig Dispense Refill   Cholecalciferol (VITAMIN D3) 250 MCG (10000 UT) capsule Take 10,000 Units by mouth in the morning.     Hydroxocobalamin Acetate 1000 MCG/ML SOLN Inject 1,000 mcg into the muscle 3 (three) times a week.     MAGNESIUM PO Take 12 mg by mouth daily.     Multiple Vitamin (MULTIVITAMIN WITH MINERALS) TABS tablet Take 1 tablet by mouth in the morning.     Zinc 50 MG TABS Take 50 mg by mouth daily.     No current facility-administered medications for this visit.     Past Medical History:  Diagnosis Date   Atrial fibrillation (HCC)    Chronic insomnia    Dysrhythmia    afib   Heart murmur    History of rheumatic fever    age 31   Mitral regurgitation    Patent foramen ovale    S/P Maze operation for atrial fibrillation 03/13/2021   Left side atrial lesion set using cryothermy with clipping of LA appendage   S/P minimally-invasive mitral valve repair 03/13/2021    Complex valvuloplasty including artificial PTFE neochord placement x4 with 36 mm Medtronic Simuform ring annuloplasty   Thoracic aortic aneurysm     Past Surgical History:  Procedure Laterality Date   CARDIAC CATHETERIZATION  02/10/2021   CARDIOVERSION     CLIPPING OF ATRIAL APPENDAGE  03/13/2021   Procedure: CLIPPING OF ATRIAL APPENDAGE  USING ATRICURE CLIP PRO2 SIZE 45MM;  Surgeon: Rexene Alberts, MD;  Location: [place];  Service: Open Heart Surgery;;   MAZE N/A 03/13/2021   Procedure: MAZE;  Surgeon: Rexene Alberts, MD;  Location: [place];  Service: Open Heart Surgery;  Laterality: N/A;   MITRAL VALVE REPAIR Right 03/13/2021   Procedure: MINIMALLY INVASIVE MITRAL VALVE REPAIR (MVR) USING MEDTRONIC SIMUFORM RING SIZE 36MM;  Surgeon: Rexene Alberts, MD;  Location: [place];  Service: Open Heart Surgery;  Laterality: Right;   RIGHT/LEFT HEART CATH AND CORONARY ANGIOGRAPHY N/A 02/10/2021   Procedure: RIGHT/LEFT HEART CATH AND CORONARY ANGIOGRAPHY;  Surgeon: Sherren Mocha, MD;  Location: [place] [facility];  Service: Cardiovascular;  Laterality: N/A;   TEE WITHOUT CARDIOVERSION N/A 01/16/2021   Procedure: TRANSESOPHAGEAL ECHOCARDIOGRAM (TEE);  Surgeon: Lelon Perla, MD;  Location: [facility] ENDOSCOPY;  Service: Cardiovascular;  Laterality: N/A;   TEE WITHOUT CARDIOVERSION N/A 03/13/2021   Procedure: TRANSESOPHAGEAL ECHOCARDIOGRAM (TEE);  Surgeon: Rexene Alberts, MD;  Location: [place];  Service: Open Heart Surgery;  Laterality: N/A;    Social History   Socioeconomic History  Marital status: Married    Spouse name: Not on file   Number of children: Not on file   Years of education: Not on file   Highest education level: Not on file  Occupational History   Not on file  Tobacco Use   Smoking status: Never   Smokeless tobacco: Never  Vaping Use   Vaping Use: Never used  Substance and Sexual Activity   Alcohol use: No   Drug use: No   Sexual activity: Yes  Other Topics Concern   Not  on file  Social History Narrative          [employer] [place]   Married   2 daughters ages 26, 48      Social Determinants of Health   Financial Resource Strain: Not on file  Food Insecurity: Not on file  Transportation Needs: Not on file  Physical Activity: Not on file  Stress: Not on file  Social Connections: Not on file  Intimate Partner Violence: Not on file    Family History  Problem Relation Age of Onset   Pulmonary fibrosis Mother    Diverticulosis Father     ROS: no fevers or chills, productive cough, hemoptysis, dysphasia, odynophagia, melena, hematochezia, dysuria, hematuria, rash, seizure activity, orthopnea, PND, pedal edema, claudication. Remaining systems are negative.  Physical Exam: Well-developed well-nourished in no acute distress.  Skin is warm and dry.  HEENT is normal.  Neck is supple.  Chest is clear to auscultation with normal expansion.  Cardiovascular exam is regular rate and rhythm.  Abdominal exam nontender or distended. No masses palpated. Extremities show no edema. neuro grossly intact  ECG-sinus tachycardia at a rate of 113, no ST changes.  Personally reviewed  A/P  1 status post mitral valve repair-follow-up echocardiogram showed no residual mitral regurgitation and no significant mitral stenosis.  Continue SBE prophylaxis.  2 thoracic aortic aneurysm-repeat CTA June 2023.  3 paroxysmal atrial fibrillation-patient remains in sinus rhythm.  He is status post Maze procedure and left atrial appendage clipping.  We have therefore discontinued his anticoagulation previously.  4 history of syncope-no recurrences and previous episode felt secondary to dehydration with possible vagal contribution.  Kirk Ruths, MD

## 2021-11-08 ENCOUNTER — Other Ambulatory Visit: Payer: Self-pay

## 2021-11-08 ENCOUNTER — Encounter: Payer: Self-pay | Admitting: Cardiology

## 2021-11-08 ENCOUNTER — Ambulatory Visit: Payer: Medicare Other | Admitting: Cardiology

## 2021-11-08 VITALS — BP 112/62 | HR 113 | Ht 74.0 in | Wt 235.0 lb

## 2021-11-08 DIAGNOSIS — I712 Thoracic aortic aneurysm, without rupture, unspecified: Secondary | ICD-10-CM | POA: Diagnosis not present

## 2021-11-08 DIAGNOSIS — I480 Paroxysmal atrial fibrillation: Secondary | ICD-10-CM | POA: Diagnosis not present

## 2021-11-08 DIAGNOSIS — Z9889 Other specified postprocedural states: Secondary | ICD-10-CM | POA: Diagnosis not present

## 2021-11-08 NOTE — Patient Instructions (Signed)

## 2021-11-16 ENCOUNTER — Telehealth: Payer: Self-pay | Admitting: Medical

## 2021-11-16 NOTE — Telephone Encounter (Signed)
Left message for patient to call back and schedule Medicare Annual Wellness Visit (AWV) in office.  ° °If not able to come in office, please offer to do virtually or by telephone.  Left office number and my jabber #[phone]. ° °Due for AWVI ° °Please schedule at anytime with Nurse Health Advisor. °  °

## 2022-02-13 ENCOUNTER — Other Ambulatory Visit: Payer: Self-pay | Admitting: *Deleted

## 2022-02-13 DIAGNOSIS — I712 Thoracic aortic aneurysm, without rupture, unspecified: Secondary | ICD-10-CM

## 2022-05-28 ENCOUNTER — Encounter: Payer: Self-pay | Admitting: Cardiology

## 2022-05-30 ENCOUNTER — Encounter: Payer: Medicare Other | Admitting: Medical

## 2022-09-17 IMAGING — CT CT ANGIO CHEST
2 of 7 series · 17 of 36 positions shown · IV contrast (APPLIED)
Comparison: MRA 12/10/2020

CLINICAL DATA: Follow-up thoracic aortic aneurysm

EXAM:
CT ANGIOGRAPHY CHEST, ABDOMEN AND PELVIS
TECHNIQUE: Non-contrast CT of the chest was initially obtained.

[Series 3: ax thins · axial · 0.59mm/px · z∈[-535,+84]mm · 16 of 697 slices shown]
[im 39/697  lung]
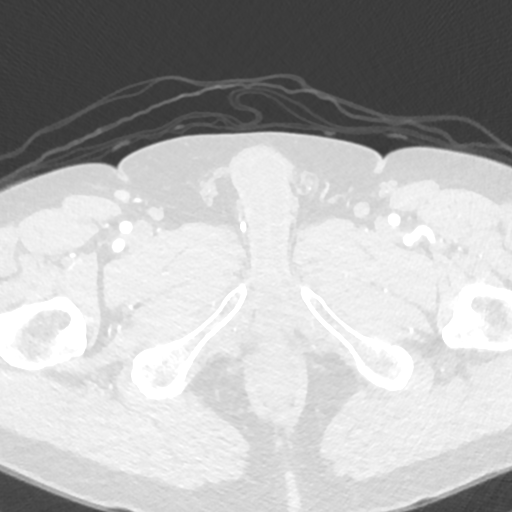
[im 78/697  mediastinal]
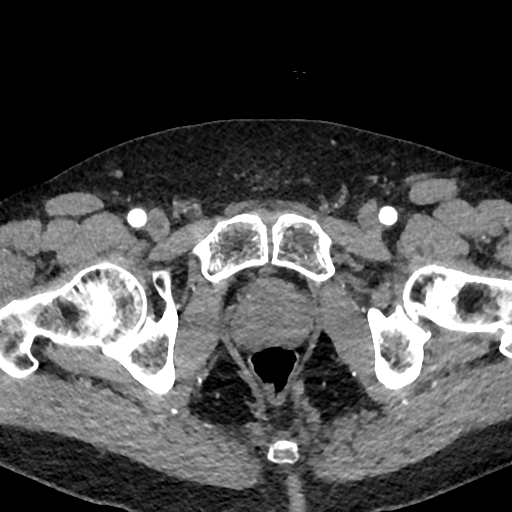
[im 117/697  lung]
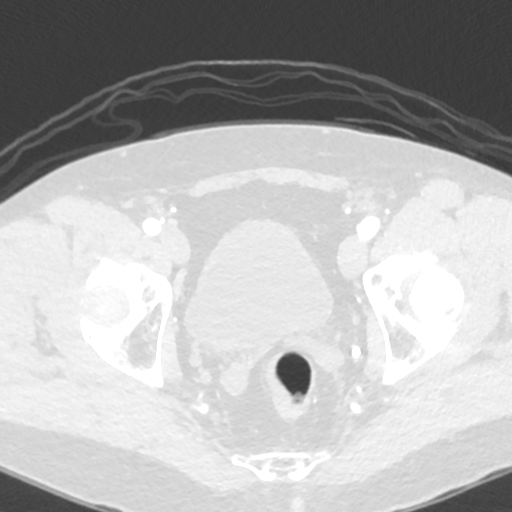
[im 155/697  mediastinal]
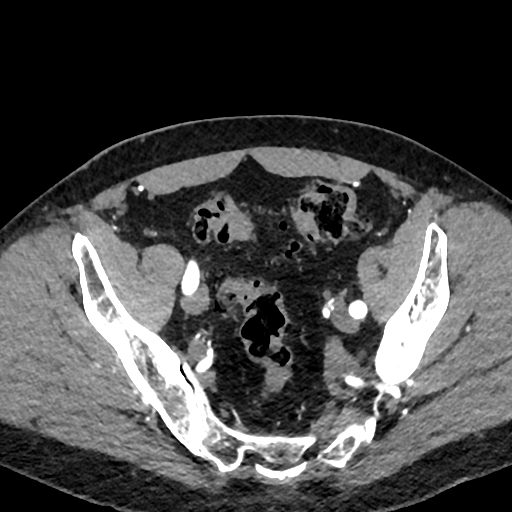
[im 194/697  lung]
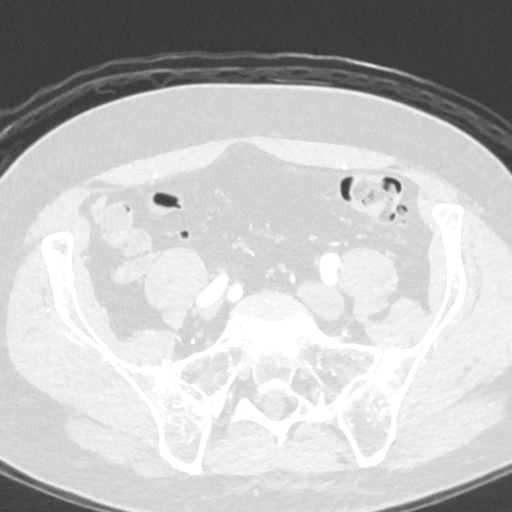
[im 233/697  mediastinal]
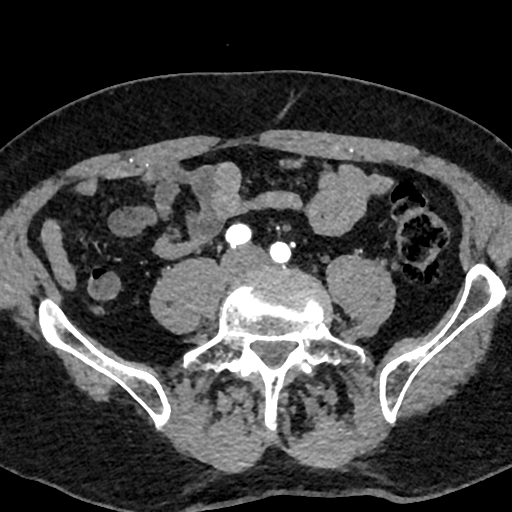
[im 271/697  lung]
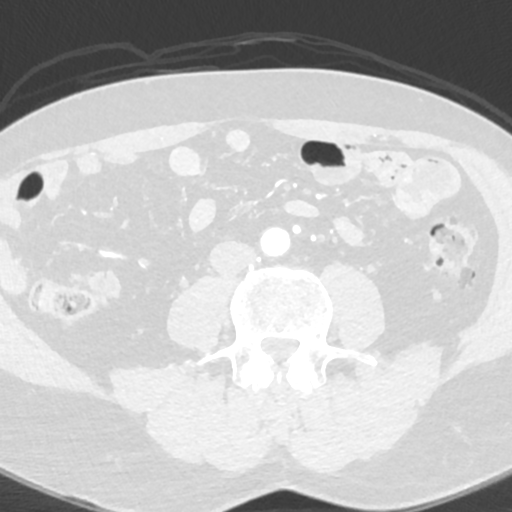
[im 310/697  mediastinal]
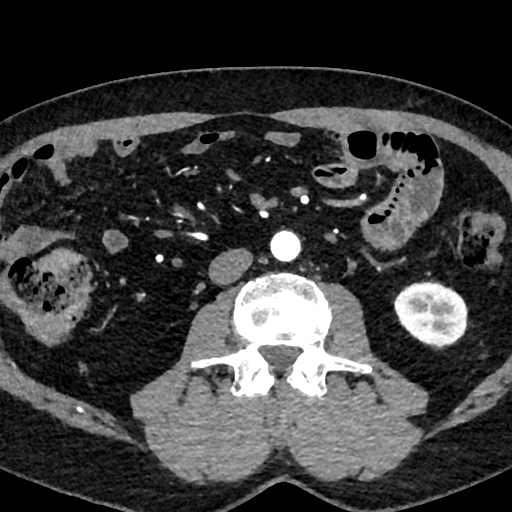
[im 387/697  lung]
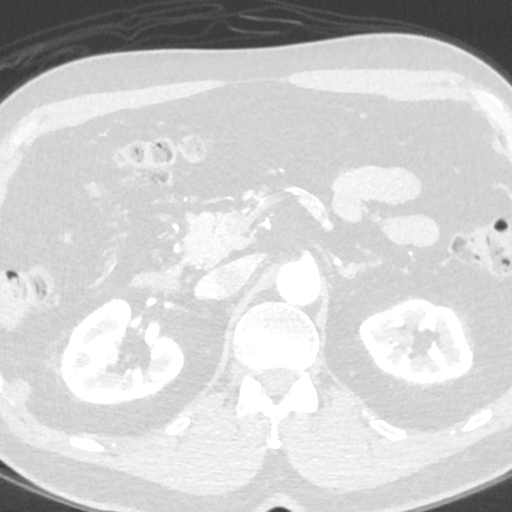
[im 426/697  mediastinal]
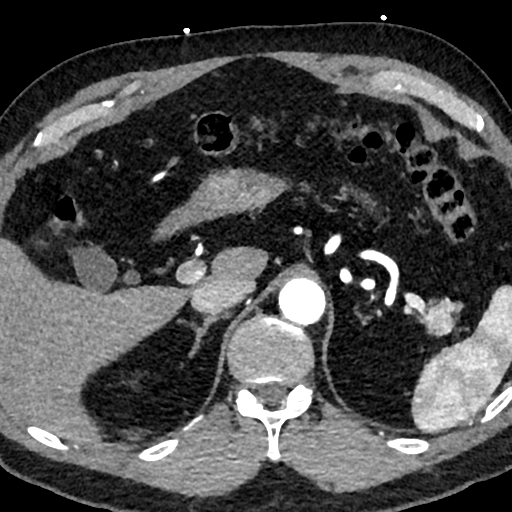
[im 465/697  lung]
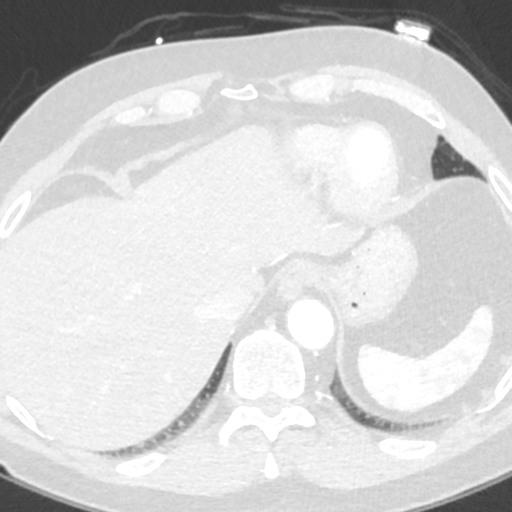
[im 503/697  mediastinal]
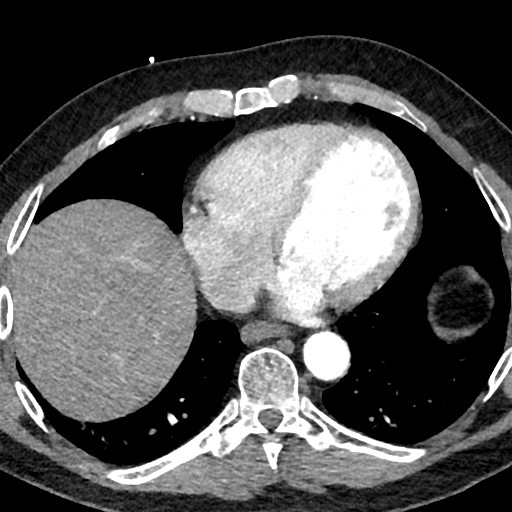
[im 542/697  lung]
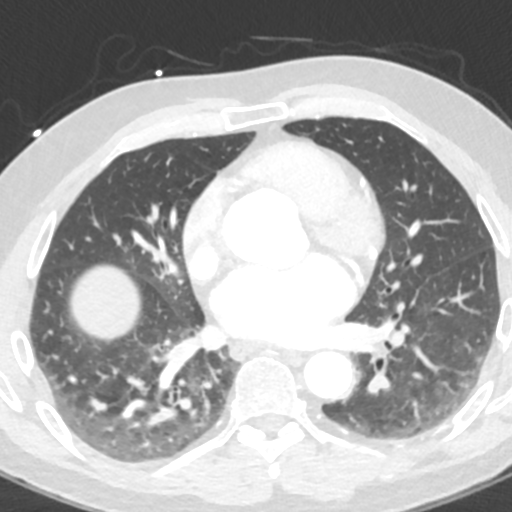
[im 581/697  mediastinal]
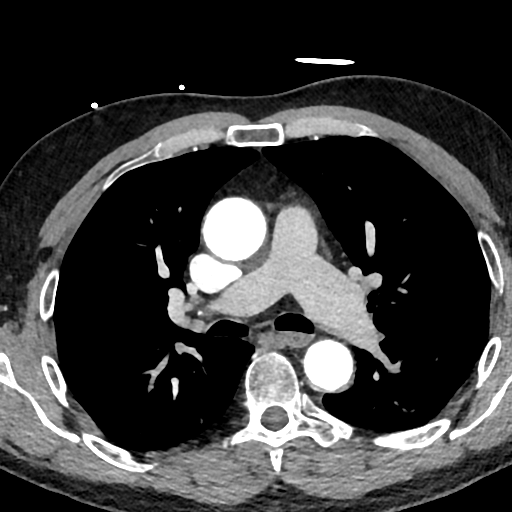
[im 619/697  lung]
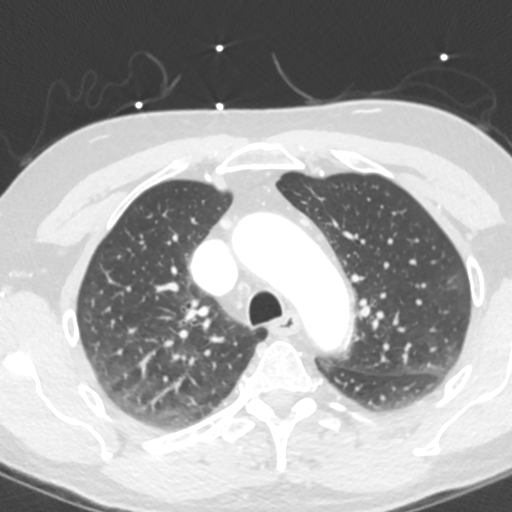
[im 658/697  mediastinal]
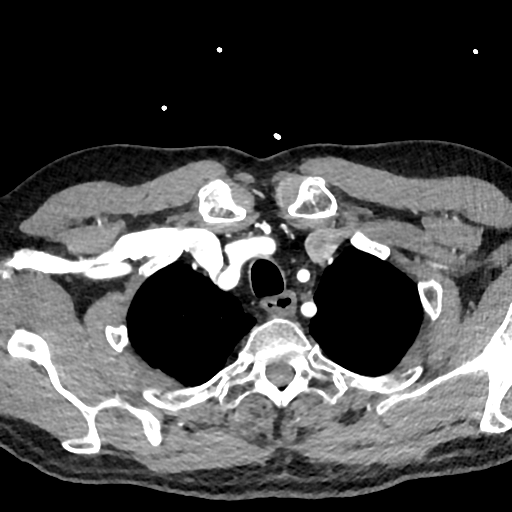

[Series 6: cor · coronal · 0.83mm/px · 1 of 143 slices shown]
[im 72/143  mediastinal]
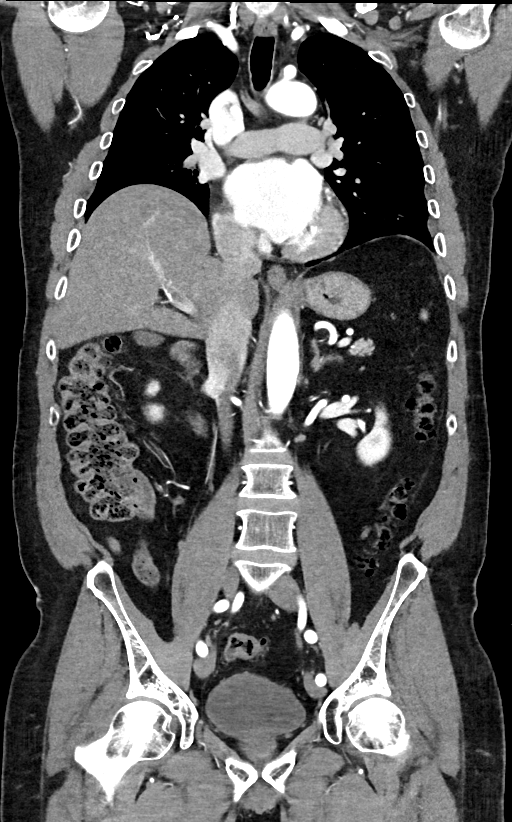

[17 of 36 positions shown; findings below may reference images not displayed]

Multidetector CT imaging through the chest, abdomen and pelvis was
performed using the standard protocol during bolus administration of
intravenous contrast. Multiplanar reconstructed images and MIPs were
obtained and reviewed to evaluate the vascular anatomy.

CONTRAST:  100mL OMNIPAQUE IOHEXOL 350 MG/ML SOLN
FINDINGS: CTA CHEST FINDINGS

Cardiovascular: Aneurysmal dilatation of the aortic arch measuring
up to 4.9 cm compared with 4.8 cm on prior MRA. Ascending thoracic
aorta 3.7 cm. Distal aortic arch 3.3 cm. Proximal descending
thoracic aorta 3.0 cm. No dissection. Heart is upper limits normal
in size. Scattered calcifications in the distal aortic arch.

Mediastinum/Nodes: No mediastinal, hilar, or axillary adenopathy.
Trachea and esophagus are unremarkable. Thyroid unremarkable.

Lungs/Pleura: Lungs are clear. No focal airspace opacities or
suspicious nodules. No effusions.

Musculoskeletal: Chest wall soft tissues are unremarkable. No acute
bony abnormality.

Review of the MIP images confirms the above findings.

CTA ABDOMEN AND PELVIS FINDINGS

VASCULAR

Aorta: No aneurysm or dissection. Proximal abdominal aorta diameter
2.5 cm. Mid abdominal aorta at the level of the renal arteries
cm. Distal abdominal aorta diameter 1.6 cm.

Celiac: Focal dissection within the proximal celiac artery. Mild
ectasia of the vessel at this level measuring 1 cm. Vessels patent.

SMA: Patent without evidence of aneurysm, dissection, vasculitis or
significant stenosis.

Renals: Both renal arteries are patent without evidence of aneurysm,
dissection, vasculitis, fibromuscular dysplasia or significant
stenosis.

IMA: Patent without evidence of aneurysm, dissection, vasculitis or
significant stenosis.

Inflow: Scattered calcifications.  No aneurysm or dissection.

Veins: No obvious venous abnormality within the limitations of this
arterial phase study.

Review of the MIP images confirms the above findings.

NON-VASCULAR

Hepatobiliary: No focal hepatic abnormality. Gallbladder
unremarkable.

Pancreas: No focal abnormality or ductal dilatation.

Spleen: No focal abnormality.  Normal size.

Adrenals/Urinary Tract: No adrenal abnormality. Left lower pole
parapelvic cyst. Otherwise no focal renal abnormality. No stones or
hydronephrosis. Urinary bladder is unremarkable.

Stomach/Bowel: Diffuse colonic diverticulosis. No active
diverticulitis. Appendix is normal. Stomach and small bowel
decompressed, unremarkable.

Lymphatic: No adenopathy

Reproductive: Prostate enlargement.

Other: No free fluid or free air.

Musculoskeletal: No acute bony abnormality.

Review of the MIP images confirms the above findings.
IMPRESSION: Aneurysmal dilatation of the aortic root measuring 4.9 cm, not
significantly changed since prior MRA. Otherwise, aorta normal
caliber. No aortic dissection.

Focal dissection within the proximal celiac artery which does not
appear to be flow limiting. Slight ectasia of the vessel at this
level, 1 cm.

No acute cardiopulmonary disease.

Diffuse colonic diverticulosis.  No active diverticulitis.

## 2022-11-03 DIAGNOSIS — I712 Thoracic aortic aneurysm, without rupture, unspecified: Secondary | ICD-10-CM | POA: Diagnosis not present

## 2022-11-03 DIAGNOSIS — Z743 Need for continuous supervision: Secondary | ICD-10-CM | POA: Diagnosis not present

## 2022-11-03 DIAGNOSIS — M25522 Pain in left elbow: Secondary | ICD-10-CM | POA: Diagnosis not present

## 2022-11-03 DIAGNOSIS — S52692B Other fracture of lower end of left ulna, initial encounter for open fracture type I or II: Secondary | ICD-10-CM | POA: Diagnosis not present

## 2022-11-03 DIAGNOSIS — M542 Cervicalgia: Secondary | ICD-10-CM | POA: Diagnosis not present

## 2022-11-03 DIAGNOSIS — E7212 Methylenetetrahydrofolate reductase deficiency: Secondary | ICD-10-CM | POA: Diagnosis not present

## 2022-11-03 DIAGNOSIS — S52512B Displaced fracture of left radial styloid process, initial encounter for open fracture type I or II: Secondary | ICD-10-CM | POA: Diagnosis not present

## 2022-11-03 DIAGNOSIS — G8918 Other acute postprocedural pain: Secondary | ICD-10-CM | POA: Diagnosis not present

## 2022-11-03 DIAGNOSIS — S52612B Displaced fracture of left ulna styloid process, initial encounter for open fracture type I or II: Secondary | ICD-10-CM | POA: Diagnosis not present

## 2022-11-03 DIAGNOSIS — Y92410 Unspecified street and highway as the place of occurrence of the external cause: Secondary | ICD-10-CM | POA: Diagnosis not present

## 2022-11-03 DIAGNOSIS — K5730 Diverticulosis of large intestine without perforation or abscess without bleeding: Secondary | ICD-10-CM | POA: Diagnosis not present

## 2022-11-03 DIAGNOSIS — S0990XA Unspecified injury of head, initial encounter: Secondary | ICD-10-CM | POA: Diagnosis not present

## 2022-11-03 DIAGNOSIS — Z7982 Long term (current) use of aspirin: Secondary | ICD-10-CM | POA: Diagnosis not present

## 2022-11-03 DIAGNOSIS — I714 Abdominal aortic aneurysm, without rupture, unspecified: Secondary | ICD-10-CM | POA: Diagnosis not present

## 2022-11-03 DIAGNOSIS — S52592B Other fractures of lower end of left radius, initial encounter for open fracture type I or II: Secondary | ICD-10-CM | POA: Diagnosis not present

## 2022-11-03 DIAGNOSIS — Y998 Other external cause status: Secondary | ICD-10-CM | POA: Diagnosis not present

## 2022-11-03 DIAGNOSIS — I4891 Unspecified atrial fibrillation: Secondary | ICD-10-CM | POA: Diagnosis not present

## 2022-11-03 DIAGNOSIS — S52502B Unspecified fracture of the lower end of left radius, initial encounter for open fracture type I or II: Secondary | ICD-10-CM | POA: Diagnosis not present

## 2022-11-03 DIAGNOSIS — S63015A Dislocation of distal radioulnar joint of left wrist, initial encounter: Secondary | ICD-10-CM | POA: Diagnosis not present

## 2022-11-03 DIAGNOSIS — Z041 Encounter for examination and observation following transport accident: Secondary | ICD-10-CM | POA: Diagnosis not present

## 2022-11-03 DIAGNOSIS — Z23 Encounter for immunization: Secondary | ICD-10-CM | POA: Diagnosis not present

## 2022-11-03 DIAGNOSIS — R29898 Other symptoms and signs involving the musculoskeletal system: Secondary | ICD-10-CM | POA: Diagnosis not present

## 2022-11-03 DIAGNOSIS — I4811 Longstanding persistent atrial fibrillation: Secondary | ICD-10-CM | POA: Diagnosis not present

## 2022-11-03 DIAGNOSIS — R109 Unspecified abdominal pain: Secondary | ICD-10-CM | POA: Diagnosis not present

## 2022-11-03 DIAGNOSIS — M79603 Pain in arm, unspecified: Secondary | ICD-10-CM | POA: Diagnosis not present

## 2022-11-03 DIAGNOSIS — S52602B Unspecified fracture of lower end of left ulna, initial encounter for open fracture type I or II: Secondary | ICD-10-CM | POA: Diagnosis not present

## 2022-11-03 DIAGNOSIS — S52502A Unspecified fracture of the lower end of left radius, initial encounter for closed fracture: Secondary | ICD-10-CM | POA: Diagnosis not present

## 2022-11-03 DIAGNOSIS — S0081XA Abrasion of other part of head, initial encounter: Secondary | ICD-10-CM | POA: Diagnosis not present

## 2022-11-04 DIAGNOSIS — S0081XA Abrasion of other part of head, initial encounter: Secondary | ICD-10-CM | POA: Diagnosis not present

## 2022-11-04 DIAGNOSIS — I4811 Longstanding persistent atrial fibrillation: Secondary | ICD-10-CM | POA: Diagnosis not present

## 2022-11-04 DIAGNOSIS — I712 Thoracic aortic aneurysm, without rupture, unspecified: Secondary | ICD-10-CM | POA: Diagnosis not present

## 2022-11-04 DIAGNOSIS — Z7982 Long term (current) use of aspirin: Secondary | ICD-10-CM | POA: Diagnosis not present

## 2022-11-04 DIAGNOSIS — R531 Weakness: Secondary | ICD-10-CM | POA: Diagnosis not present

## 2022-11-04 DIAGNOSIS — I714 Abdominal aortic aneurysm, without rupture, unspecified: Secondary | ICD-10-CM | POA: Diagnosis not present

## 2022-11-04 DIAGNOSIS — E7212 Methylenetetrahydrofolate reductase deficiency: Secondary | ICD-10-CM | POA: Diagnosis not present

## 2022-11-04 DIAGNOSIS — R29898 Other symptoms and signs involving the musculoskeletal system: Secondary | ICD-10-CM | POA: Diagnosis not present

## 2022-11-04 DIAGNOSIS — S52612B Displaced fracture of left ulna styloid process, initial encounter for open fracture type I or II: Secondary | ICD-10-CM | POA: Diagnosis not present

## 2022-11-04 DIAGNOSIS — I4891 Unspecified atrial fibrillation: Secondary | ICD-10-CM | POA: Diagnosis not present

## 2022-11-04 DIAGNOSIS — Z23 Encounter for immunization: Secondary | ICD-10-CM | POA: Diagnosis not present

## 2022-11-04 DIAGNOSIS — S52512B Displaced fracture of left radial styloid process, initial encounter for open fracture type I or II: Secondary | ICD-10-CM | POA: Diagnosis not present

## 2022-11-05 ENCOUNTER — Telehealth: Payer: Self-pay

## 2022-11-05 DIAGNOSIS — I491 Atrial premature depolarization: Secondary | ICD-10-CM | POA: Diagnosis not present

## 2022-11-05 NOTE — Transitions of Care (Post Inpatient/ED Visit) (Unsigned)
   11/05/2022  Name: DECARLOS SEPP MRN: [id number] DOB: 24-Apr-1956  Today's TOC FU Call Status: Today's TOC FU Call Status:: Unsuccessul Call (1st Attempt) Unsuccessful Call (1st Attempt) Date: 11/05/22  Attempted to reach the patient regarding the most recent Inpatient/ED visit.  Follow Up Plan: Additional outreach attempts will be made to reach the patient to complete the Transitions of Care (Post Inpatient/ED visit) call.   Signature Juanda Crumble, [place] Direct Dial [phone]

## 2022-11-06 DIAGNOSIS — S52602E Unspecified fracture of lower end of left ulna, subsequent encounter for open fracture type I or II with routine healing: Secondary | ICD-10-CM | POA: Diagnosis not present

## 2022-11-06 DIAGNOSIS — S52502E Unspecified fracture of the lower end of left radius, subsequent encounter for open fracture type I or II with routine healing: Secondary | ICD-10-CM | POA: Diagnosis not present

## 2022-11-06 NOTE — Transitions of Care (Post Inpatient/ED Visit) (Signed)
   11/06/2022  Name: Johnny [name] MRN: [id number] DOB: 10/27/1955  Today's TOC FU Call Status: Today's TOC FU Call Status:: Successful TOC FU Call Competed Unsuccessful Call (1st Attempt) Date: 11/05/22 [facility] FU Call Complete Date: 11/06/22  Transition Care Management Follow-up Telephone Call Date of Discharge: 11/04/22 Discharge Facility: [facility] Type of Discharge: Inpatient Admission Primary Inpatient Discharge Diagnosis:: fracture left radius How have you been since you were released from the hospital?: Better Any questions or concerns?: No  Items Reviewed: Did you receive and understand the discharge instructions provided?: Yes Medications obtained and verified?: Yes (Medications Reviewed) Any new allergies since your discharge?: No Dietary orders reviewed?: NA Do you have support at home?: Yes People in Home: spouse  Home Care and Equipment/Supplies: [place] Ordered?: NA Any new equipment or medical supplies ordered?: NA  Functional Questionnaire: Do you need assistance with bathing/showering or dressing?: No Do you need assistance with meal preparation?: No Do you need assistance with eating?: No Do you have difficulty maintaining continence: No Do you need assistance with getting out of bed/getting out of a chair/moving?: No Do you have difficulty managing or taking your medications?: No  Folllow up appointments reviewed: PCP Follow-up appointment confirmed?: NA Specialist Hospital Follow-up appointment confirmed?: Yes Date of Specialist follow-up appointment?: 11/06/22 Follow-Up Specialty Provider:: Dr Lenn Sink Do you need transportation to your follow-up appointment?: No Do you understand care options if your condition(s) worsen?: Yes-patient verbalized understanding    [place], [place] Nurse Health Advisor Direct Dial [phone]

## 2022-11-08 ENCOUNTER — Ambulatory Visit (INDEPENDENT_AMBULATORY_CARE_PROVIDER_SITE_OTHER): Payer: Medicare Other | Admitting: Medical

## 2022-11-08 ENCOUNTER — Encounter: Payer: Self-pay | Admitting: Medical

## 2022-11-08 ENCOUNTER — Ambulatory Visit (HOSPITAL_BASED_OUTPATIENT_CLINIC_OR_DEPARTMENT_OTHER)
Admission: RE | Admit: 2022-11-08 | Discharge: 2022-11-08 | Disposition: A | Discharge status: Home / Self Care | Payer: Medicare Other | Source: Ambulatory Visit | Attending: Medical | Admitting: Medical

## 2022-11-08 VITALS — BP 110/78 | HR 102 | Temp 98.0°F | Resp 18 | Ht 74.0 in | Wt 238.8 lb

## 2022-11-08 DIAGNOSIS — S2241XA Multiple fractures of ribs, right side, initial encounter for closed fracture: Secondary | ICD-10-CM | POA: Diagnosis not present

## 2022-11-08 DIAGNOSIS — R0781 Pleurodynia: Secondary | ICD-10-CM | POA: Insufficient documentation

## 2022-11-08 DIAGNOSIS — Z4802 Encounter for removal of sutures: Secondary | ICD-10-CM | POA: Diagnosis not present

## 2022-11-08 DIAGNOSIS — S52126K Nondisplaced fracture of head of unspecified radius, subsequent encounter for closed fracture with nonunion: Secondary | ICD-10-CM

## 2022-11-08 DIAGNOSIS — K5909 Other constipation: Secondary | ICD-10-CM | POA: Insufficient documentation

## 2022-11-08 DIAGNOSIS — K5900 Constipation, unspecified: Secondary | ICD-10-CM | POA: Diagnosis not present
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
## 2022-11-19 DIAGNOSIS — S52502E Unspecified fracture of the lower end of left radius, subsequent encounter for open fracture type I or II with routine healing: Secondary | ICD-10-CM | POA: Diagnosis not present

## 2022-12-17 DIAGNOSIS — S52502E Unspecified fracture of the lower end of left radius, subsequent encounter for open fracture type I or II with routine healing: Secondary | ICD-10-CM | POA: Diagnosis not present

## 2023-01-07 DIAGNOSIS — I340 Nonrheumatic mitral (valve) insufficiency: Secondary | ICD-10-CM | POA: Diagnosis not present

## 2023-01-07 DIAGNOSIS — I4891 Unspecified atrial fibrillation: Secondary | ICD-10-CM | POA: Diagnosis not present

## 2023-01-07 DIAGNOSIS — I351 Nonrheumatic aortic (valve) insufficiency: Secondary | ICD-10-CM | POA: Diagnosis not present

## 2023-01-07 DIAGNOSIS — Z9889 Other specified postprocedural states: Secondary | ICD-10-CM | POA: Diagnosis not present

## 2023-01-07 DIAGNOSIS — Z472 Encounter for removal of internal fixation device: Secondary | ICD-10-CM | POA: Diagnosis not present

## 2023-01-07 DIAGNOSIS — Z7982 Long term (current) use of aspirin: Secondary | ICD-10-CM | POA: Diagnosis not present

## 2023-01-07 DIAGNOSIS — Z79899 Other long term (current) drug therapy: Secondary | ICD-10-CM | POA: Diagnosis not present

## 2023-01-07 DIAGNOSIS — Z888 Allergy status to other drugs, medicaments and biological substances status: Secondary | ICD-10-CM | POA: Diagnosis not present

## 2023-01-07 DIAGNOSIS — Z91030 Bee allergy status: Secondary | ICD-10-CM | POA: Diagnosis not present

## 2023-01-07 DIAGNOSIS — S52501D Unspecified fracture of the lower end of right radius, subsequent encounter for closed fracture with routine healing: Secondary | ICD-10-CM | POA: Diagnosis not present

## 2023-01-07 DIAGNOSIS — I712 Thoracic aortic aneurysm, without rupture, unspecified: Secondary | ICD-10-CM | POA: Diagnosis not present

## 2023-01-07 DIAGNOSIS — S52502E Unspecified fracture of the lower end of left radius, subsequent encounter for open fracture type I or II with routine healing: Secondary | ICD-10-CM | POA: Diagnosis not present

## 2023-01-07 DIAGNOSIS — S52602E Unspecified fracture of lower end of left ulna, subsequent encounter for open fracture type I or II with routine healing: Secondary | ICD-10-CM | POA: Diagnosis not present

## 2023-02-01 DIAGNOSIS — Z969 Presence of functional implant, unspecified: Secondary | ICD-10-CM | POA: Diagnosis not present

## 2023-02-08 ENCOUNTER — Other Ambulatory Visit: Payer: Self-pay | Admitting: *Deleted

## 2023-02-08 DIAGNOSIS — I7143 Infrarenal abdominal aortic aneurysm, without rupture: Secondary | ICD-10-CM

## 2023-02-08 DIAGNOSIS — R1907 Generalized intra-abdominal and pelvic swelling, mass and lump: Secondary | ICD-10-CM

## 2023-03-01 DIAGNOSIS — S52602S Unspecified fracture of lower end of left ulna, sequela: Secondary | ICD-10-CM | POA: Diagnosis not present

## 2023-03-01 DIAGNOSIS — S52502S Unspecified fracture of the lower end of left radius, sequela: Secondary | ICD-10-CM | POA: Diagnosis not present

## 2023-03-01 DIAGNOSIS — Z969 Presence of functional implant, unspecified: Secondary | ICD-10-CM | POA: Diagnosis not present

## 2023-03-12 ENCOUNTER — Telehealth: Payer: Self-pay | Admitting: Cardiology

## 2023-03-12 DIAGNOSIS — R1907 Generalized intra-abdominal and pelvic swelling, mass and lump: Secondary | ICD-10-CM

## 2023-03-12 DIAGNOSIS — I712 Thoracic aortic aneurysm, without rupture, unspecified: Secondary | ICD-10-CM

## 2023-03-12 DIAGNOSIS — I7143 Infrarenal abdominal aortic aneurysm, without rupture: Secondary | ICD-10-CM

## 2023-03-12 NOTE — Telephone Encounter (Signed)
Patient is calling about the test the dr Jens Som has ordered. Stated its not the normal test he usually takes yearly, wondering why there is a change. Please advise

## 2023-03-12 NOTE — Telephone Encounter (Signed)
Patient states he has had MRA/MRI for upper and lower aorta for over 15 years.  He states he has only had a CT prior to open heart surgery. He is not sure why he is having the CT now rather than his normal test MRA/MRI. Please advise

## 2023-03-13 ENCOUNTER — Encounter: Payer: Self-pay | Admitting: Cardiology

## 2023-03-13 NOTE — Telephone Encounter (Signed)
Left message for pt, order changed to MRA. Sent to pre-cert.

## 2023-04-30 ENCOUNTER — Other Ambulatory Visit: Payer: Medicare Other

## 2023-07-24 DIAGNOSIS — S52502D Unspecified fracture of the lower end of left radius, subsequent encounter for closed fracture with routine healing: Secondary | ICD-10-CM | POA: Diagnosis not present

## 2023-07-24 DIAGNOSIS — S52602D Unspecified fracture of lower end of left ulna, subsequent encounter for closed fracture with routine healing: Secondary | ICD-10-CM | POA: Diagnosis not present

## 2024-01-20 ENCOUNTER — Encounter: Payer: Self-pay | Admitting: Cardiology

## 2024-01-27 ENCOUNTER — Telehealth: Payer: Self-pay | Admitting: Cardiology

## 2024-01-27 NOTE — Telephone Encounter (Signed)
 Patient wants to confirm orders for MR ANGIO ABDOMEN W WO CONTRAST and MR Angiogram Chest W Wo Contrast ordered 03/13/23 are still valid.  Patient wants to have test done in [facility].

## 2024-01-27 NOTE — Telephone Encounter (Signed)
 Spoke with pt regarding the orders for an MR Angio Abdomen and MR Angiogram Chest. Orders are still active and the tests need to be scheduled. A message was sent to centralized scheduling to get the pt set up. Pt verbalized understanding. All questions if any were answered.

## 2024-02-06 ENCOUNTER — Encounter: Payer: Self-pay | Admitting: Cardiology

## 2024-02-24 ENCOUNTER — Ambulatory Visit: Payer: Self-pay | Admitting: Cardiology

## 2024-02-24 ENCOUNTER — Ambulatory Visit
Admission: RE | Admit: 2024-02-24 | Discharge: 2024-02-24 | Disposition: A | Discharge status: Home / Self Care | Source: Ambulatory Visit | Attending: Cardiology | Admitting: Cardiology

## 2024-02-24 DIAGNOSIS — I712 Thoracic aortic aneurysm, without rupture, unspecified: Secondary | ICD-10-CM

## 2024-02-24 DIAGNOSIS — I7121 Aneurysm of the ascending aorta, without rupture: Secondary | ICD-10-CM | POA: Diagnosis not present

## 2024-02-24 DIAGNOSIS — I7143 Infrarenal abdominal aortic aneurysm, without rupture: Secondary | ICD-10-CM

## 2024-02-24 MED ORDER — GADOPICLENOL 0.5 MMOL/ML IV SOLN
10.0000 mL | Freq: Once | INTRAVENOUS | Status: AC | PRN
  Administered 2024-02-24: 10 mL via INTRAVENOUS

## 2024-02-26 ENCOUNTER — Other Ambulatory Visit: Payer: Self-pay | Admitting: *Deleted

## 2024-02-26 DIAGNOSIS — I712 Thoracic aortic aneurysm, without rupture, unspecified: Secondary | ICD-10-CM

## 2024-07-15 NOTE — Progress Notes (Signed)
 Johnny [name]                                          MRN: [id number]   07/15/2024   The [facility] Quality Team Specialist reviewed this patient medical record for the purposes of chart review for care gap closure. The following were reviewed: chart review for care gap closure-colorectal cancer screening.    [facility] Quality Team

## 2024-07-20 ENCOUNTER — Telehealth: Payer: Self-pay

## 2024-07-20 NOTE — Telephone Encounter (Signed)
 Called patient to make an appointment. He will call us  back to schedule. He states that he has to get his schedule before making an appointment.

## 2024-08-26 ENCOUNTER — Ambulatory Visit
Admission: RE | Admit: 2024-08-26 | Discharge: 2024-08-26 | Disposition: A | Discharge status: Home / Self Care | Source: Ambulatory Visit | Attending: Cardiology | Admitting: Cardiology

## 2024-08-26 DIAGNOSIS — I712 Thoracic aortic aneurysm, without rupture, unspecified: Secondary | ICD-10-CM

## 2024-08-26 MED ORDER — GADOPICLENOL 0.5 MMOL/ML IV SOLN
10.0000 mL | Freq: Once | INTRAVENOUS | Status: AC | PRN
  Administered 2024-08-26: 10 mL via INTRAVENOUS

## 2024-08-29 ENCOUNTER — Ambulatory Visit: Payer: Self-pay | Admitting: Cardiology

## 2024-09-04 ENCOUNTER — Telehealth: Payer: Self-pay | Admitting: *Deleted

## 2024-09-04 ENCOUNTER — Ambulatory Visit

## 2024-09-04 VITALS — BP 130/80 | HR 58 | Temp 97.8°F | Resp 16 | Ht 72.0 in | Wt 244.5 lb

## 2024-09-04 DIAGNOSIS — Z0000 Encounter for general adult medical examination without abnormal findings: Secondary | ICD-10-CM

## 2024-09-04 DIAGNOSIS — Z1211 Encounter for screening for malignant neoplasm of colon: Secondary | ICD-10-CM | POA: Diagnosis not present

## 2024-09-04 NOTE — Progress Notes (Signed)
 "  Chief Complaint  Patient presents with   Medicare Wellness     Subjective:   Johnny [name] is a 68 y.o. male who presents for a Medicare Annual Wellness Visit.  Visit info / Clinical Intake: Medicare Wellness Visit Type:: Initial Annual Wellness Visit Persons participating in visit and providing information:: patient Medicare Wellness Visit Mode:: In-person (required for WTM) Interpreter Needed?: No Pre-visit prep was completed: yes AWV questionnaire completed by patient prior to visit?: yes Date:: 08/31/24 Living arrangements:: (Patient-Rptd) lives with spouse/significant other Patient's Overall Health Status Rating: (Patient-Rptd) excellent Typical amount of pain: (Patient-Rptd) none Does pain affect daily life?: (Patient-Rptd) no Are you currently prescribed opioids?: no  Dietary Habits and Nutritional Risks How many meals a day?: (Patient-Rptd) 3 Eats fruit and vegetables daily?: (Patient-Rptd) yes Most meals are obtained by: (Patient-Rptd) preparing own meals; eating out; having others provide food In the last 2 weeks, have you had any of the following?: none Diabetic:: no  Functional Status Activities of Daily Living (to include ambulation/medication): (Patient-Rptd) Independent Ambulation: (Patient-Rptd) Independent Medication Administration: (Patient-Rptd) Independent Home Management (perform basic housework or laundry): (Patient-Rptd) Independent Manage your own finances?: (Patient-Rptd) yes Primary transportation is: (Patient-Rptd) driving Concerns about vision?: no *vision screening is required for WTM* (up to date with Dr Ladora at [facility]) Concerns about hearing?: no  Fall Screening Falls in the past year?: (Patient-Rptd) 0 Number of falls in past year: 0 Was there an injury with Fall?: 0 Fall Risk Category Calculator: 0 Patient Fall Risk Level: Low Fall Risk  Fall Risk Patient at Risk for Falls Due to: No Fall Risks Fall risk Follow up: Falls  evaluation completed  Home and Transportation Safety: All rugs have non-skid backing?: (!) (Patient-Rptd) no All stairs or steps have railings?: (Patient-Rptd) yes Grab bars in the bathtub or shower?: (!) (Patient-Rptd) no Have non-skid surface in bathtub or shower?: (!) (Patient-Rptd) no Good home lighting?: (Patient-Rptd) yes Regular seat belt use?: (Patient-Rptd) yes Hospital stays in the last year:: (Patient-Rptd) no  Cognitive Assessment Difficulty concentrating, remembering, or making decisions? : (Patient-Rptd) no Will 6CIT or Mini Cog be Completed: yes What year is it?: 0 points What month is it?: 0 points Give patient an address phrase to remember (5 components): [address], [place]  About what time is it?: 0 points Count backwards from 20 to 1: 0 points Say the months of the year in reverse: 0 points Repeat the address phrase from earlier: 2 points 6 CIT Score: 2 points  Advance Directives (For Healthcare) Does Patient Have a Medical Advance Directive?: No Would patient like information on creating a medical advance directive?: Yes (MAU/Ambulatory/Procedural Areas - Information given)  Reviewed/Updated  Reviewed/Updated: Reviewed All (Medical, Surgical, Family, Medications, Allergies, Care Teams, Patient Goals)    Allergies (verified) Bee venom and Antihistamines, chlorpheniramine-type   Current Medications (verified) Outpatient Encounter Medications as of 09/04/2024  Medication Sig   Cholecalciferol (VITAMIN D3) 250 MCG (10000 UT) capsule Take 10,000 Units by mouth in the morning.   Cyanocobalamin  (B-12) 5000 MCG SUBL Place 2 each under the tongue daily.   MAGNESIUM  PO Take 12 mg by mouth daily.   Multiple Vitamin (MULTIVITAMIN WITH MINERALS) TABS tablet Take 1 tablet by mouth in the morning.   Zinc 50 MG TABS Take 50 mg by mouth daily.   [DISCONTINUED] Hydroxocobalamin Acetate 1000 MCG/ML SOLN Inject 1,000 mcg into the muscle 3 (three) times a week.    No facility-administered encounter medications on file as of 09/04/2024.  History: Past Medical History:  Diagnosis Date   Atrial fibrillation (HCC)    Chronic insomnia    Dysrhythmia    afib   Heart murmur    History of rheumatic fever    age 58   Mitral regurgitation    Patent foramen ovale    S/P Maze operation for atrial fibrillation 03/13/2021   Left side atrial lesion set using cryothermy with clipping of LA appendage   S/P minimally-invasive mitral valve repair 03/13/2021   Complex valvuloplasty including artificial PTFE neochord placement x4 with 36 mm Medtronic Simuform ring annuloplasty   Thoracic aortic aneurysm    Past Surgical History:  Procedure Laterality Date   CARDIAC CATHETERIZATION  02/10/2021   CARDIOVERSION     CLIPPING OF ATRIAL APPENDAGE  03/13/2021   Procedure: CLIPPING OF ATRIAL APPENDAGE  USING ATRICURE CLIP PRO2 SIZE ;  Surgeon: Dusty Sudie DEL, MD;  Location: [facility] OR;  Service: Open Heart Surgery;;   MAZE N/A 03/13/2021   Procedure: MAZE;  Surgeon: Dusty Sudie DEL, MD;  Location: [facility] OR;  Service: Open Heart Surgery;  Laterality: N/A;   MITRAL VALVE REPAIR Right 03/13/2021   Procedure: MINIMALLY INVASIVE MITRAL VALVE REPAIR (MVR) USING MEDTRONIC SIMUFORM RING SIZE ;  Surgeon: Dusty Sudie DEL, MD;  Location: [facility] OR;  Service: Open Heart Surgery;  Laterality: Right;   RIGHT/LEFT HEART CATH AND CORONARY ANGIOGRAPHY N/A 02/10/2021   Procedure: RIGHT/LEFT HEART CATH AND CORONARY ANGIOGRAPHY;  Surgeon: Wonda Sharper, MD;  Location[facility] INVASIVE CV LAB;  Service: Cardiovascular;  Laterality: N/A;   TEE WITHOUT CARDIOVERSION N/A 01/16/2021   Procedure: TRANSESOPHAGEAL ECHOCARDIOGRAM (TEE);  Surgeon: Pietro Redell RAMAN, MD;  Location: [facility] ENDOSCOPY;  Service: Cardiovascular;  Laterality: N/A;   TEE WITHOUT CARDIOVERSION N/A 03/13/2021   Procedure: TRANSESOPHAGEAL ECHOCARDIOGRAM (TEE);  Surgeon: Dusty Sudie DEL, MD;  Location: [facility] OR;  Service: Open Heart  Surgery;  Laterality: N/A;   Family History  Problem Relation Age of Onset   Pulmonary fibrosis Mother    Diverticulosis Father    Lung cancer Sister    Parkinson's disease Sister    Social History   Occupational History   Not on file  Tobacco Use   Smoking status: Never   Smokeless tobacco: Never  Vaping Use   Vaping status: Never Used  Substance and Sexual Activity   Alcohol use: No   Drug use: No   Sexual activity: Yes   Tobacco Counseling Counseling given: Not Answered  SDOH Screenings   Food Insecurity: No Food Insecurity (08/31/2024)  Housing: Low Risk (08/31/2024)  Transportation Needs: No Transportation Needs (08/31/2024)  Utilities: Not At Risk (09/04/2024)  Depression (PHQ2-9): Low Risk (09/04/2024)  Financial Resource Strain: Low Risk (08/31/2024)  Physical Activity: Sufficiently Active (08/31/2024)  Social Connections: Socially Integrated (08/31/2024)  Stress: No Stress Concern Present (08/31/2024)  Tobacco Use: Low Risk (09/04/2024)   See flowsheets for full screening details  Depression Screen PHQ 2 & 9 Depression Scale- Over the past 2 weeks, how often have you been bothered by any of the following problems? Little interest or pleasure in doing things: 0 Feeling down, depressed, or hopeless (PHQ Adolescent also includes...irritable): 0 PHQ-2 Total Score: 0     Goals Addressed   None          Objective:    Today's Vitals   09/04/24 1501  BP: 130/80  Pulse: (!) 58  Resp: 16  Temp: 97.8 F (36.6 C)  TempSrc: Oral  SpO2: 98%  Weight: 244 lb  8 oz (110.9 kg)  Height: 6' (1.829 m)   Body mass index is 33.16 kg/m.  Hearing/Vision screen No results found. Immunizations and Health Maintenance Health Maintenance  Topic Date Due   COVID-19 Vaccine (1) Never done   Hepatitis C Screening  Never done   Pneumococcal Vaccine: 50+ Years (1 of 2 - PCV) Never done   Colonoscopy  Never done   Influenza Vaccine  04/17/2024   Zoster Vaccines-  Shingrix (1 of 2) 09/04/2025 (Originally 05/10/1975)   Medicare Annual Wellness (AWV)  09/04/2025   DTaP/Tdap/Td (3 - Td or Tdap) 11/03/2032   Meningococcal B Vaccine  Aged Out        Assessment/Plan:  This is a routine wellness examination for Johnny [name].  Patient Care Team: Saguier, Edward, PA-C as PCP - General (Internal Medicine) Pietro Redell RAMAN, MD as PCP - Cardiology (Cardiology) Patient, No Pcp Per (General Practice) Pietro Redell RAMAN, MD (Cardiology)  I have personally reviewed and noted the following in the patients chart:   Medical and social history Use of alcohol, tobacco or illicit drugs  Current medications and supplements including opioid prescriptions. Functional ability and status Nutritional status Physical activity Advanced directives List of other physicians Hospitalizations, surgeries, and ER visits in previous 12 months Vitals Screenings to include cognitive, depression, and falls Referrals and appointments  Orders Placed This Encounter  Procedures   Cologuard    Standing Status:   Future    Expected Date:   09/04/2024    Expiration Date:   09/04/2025   In addition, I have reviewed and discussed with patient certain preventive protocols, quality metrics, and best practice recommendations. A written personalized care plan for preventive services as well as general preventive health recommendations were provided to patient.   Lolita Libra, CMA   09/04/2024   Return in 1 year (on 09/04/2025).  After Visit Summary: (In Person-Printed) AVS printed and given to the patient  Nurse Notes: Vaccines not given: Covid, Flu, Shingles declined today HM Addressed: Cologuard Ordered Pt will research pneumonia information and let us  know if he would like to proceed with that.  "

## 2024-09-04 NOTE — Telephone Encounter (Signed)
 Pt had AWV today. FYI:   He has scheduled a cpe for 10/07/24 9:20am.  He is willing to do the Hep C screen and would like PSA screening at upcoming CPE.

## 2024-09-04 NOTE — Patient Instructions (Addendum)
 Mr. Baggerly,  Thank you for taking the time for your Medicare Wellness Visit. I appreciate your continued commitment to your health goals. Please review the care plan we discussed, and feel free to reach out if I can assist you further.  Please note that Annual Wellness Visits do not include a physical exam. Some assessments may be limited, especially if the visit was conducted virtually. If needed, we may recommend an in-person follow-up with your provider.   Ongoing Care Seeing your primary care provider every 3 to 6 months helps us  monitor your health and provide consistent, personalized care.   Dallas Maxwell, PA-C: 10/07/24 9:20am, physical and labs Medicare AWV:  Call when you are ready to schedule your next AWV.      Referrals If a referral was made during today's visit and you haven't received any updates within two weeks, please contact the referred provider directly to check on the status.  Cologuard:  has been ordered today. You should received the box in the mail within 1-2 weeks. Please let us  know if you do not get it.  Recommended Screenings: Pneumonia Vaccine -- let us  know after your research if you would like to get it at your physical in January.  Health Maintenance  Topic Date Due   Medicare Annual Wellness Visit  Never done   COVID-19 Vaccine (1) Never done   Hepatitis C Screening  Never done   Pneumococcal Vaccine for age over 77 (1 of 2 - PCV) Never done   Zoster (Shingles) Vaccine (1 of 2) Never done   Colon Cancer Screening  Never done   Flu Shot  04/17/2024   DTaP/Tdap/Td vaccine (3 - Td or Tdap) 11/03/2032   Meningitis B Vaccine  Aged Out       03/15/2021    2:50 PM  Advanced Directives  Does Patient Have a Medical Advance Directive? No  Would patient like information on creating a medical advance directive? No - Patient declined  Once completed and notarized, you may return a copy of your Advanced Directive(s) by either of the following:  Bring a  copy of your health care power of attorney and living will to the office to be added to your chart at your convenience. You can mail a copy to [facility] [address] [place], [place] [address] or email to [email or web address]   Vision: Annual vision screenings are recommended for early detection of glaucoma, cataracts, and diabetic retinopathy. These exams can also reveal signs of chronic conditions such as diabetes and high blood pressure.  Dental: Annual dental screenings help detect early signs of oral cancer, gum disease, and other conditions linked to overall health, including heart disease and diabetes.  Please see the attached documents for additional preventive care recommendations.

## 2024-10-07 ENCOUNTER — Ambulatory Visit: Admitting: Medical

## 2024-10-07 ENCOUNTER — Telehealth: Payer: Self-pay | Admitting: *Deleted

## 2024-10-07 ENCOUNTER — Encounter: Payer: Self-pay | Admitting: Medical

## 2024-10-07 ENCOUNTER — Ambulatory Visit: Payer: Self-pay | Admitting: Medical

## 2024-10-07 VITALS — BP 118/80 | HR 93 | Temp 97.7°F | Resp 16 | Ht 72.0 in | Wt 242.6 lb

## 2024-10-07 DIAGNOSIS — R350 Frequency of micturition: Secondary | ICD-10-CM | POA: Diagnosis not present

## 2024-10-07 DIAGNOSIS — I712 Thoracic aortic aneurysm, without rupture, unspecified: Secondary | ICD-10-CM | POA: Diagnosis not present

## 2024-10-07 DIAGNOSIS — R739 Hyperglycemia, unspecified: Secondary | ICD-10-CM

## 2024-10-07 DIAGNOSIS — Z1211 Encounter for screening for malignant neoplasm of colon: Secondary | ICD-10-CM

## 2024-10-07 DIAGNOSIS — E785 Hyperlipidemia, unspecified: Secondary | ICD-10-CM | POA: Diagnosis not present

## 2024-10-07 DIAGNOSIS — N181 Chronic kidney disease, stage 1: Secondary | ICD-10-CM

## 2024-10-07 LAB — COMPREHENSIVE METABOLIC PANEL WITH GFR
ALT: 20 U/L (ref 3–53)
AST: 24 U/L (ref 5–37)
Albumin: 4.3 g/dL (ref 3.5–5.2)
Alkaline Phosphatase: 77 U/L (ref 39–117)
BUN: 17 mg/dL (ref 6–23)
CO2: 25 meq/L (ref 19–32)
Calcium: 9.6 mg/dL (ref 8.4–10.5)
Chloride: 104 meq/L (ref 96–112)
Creatinine, Ser: 1.07 mg/dL (ref 0.40–1.50)
GFR: 70.36 mL/min
Glucose, Bld: 89 mg/dL (ref 70–99)
Potassium: 4.3 meq/L (ref 3.5–5.1)
Sodium: 137 meq/L (ref 135–145)
Total Bilirubin: 1.2 mg/dL (ref 0.2–1.2)
Total Protein: 6.8 g/dL (ref 6.0–8.3)

## 2024-10-07 LAB — HEMOGLOBIN A1C: Hgb A1c MFr Bld: 6.3 % (ref 4.6–6.5)

## 2024-10-07 LAB — LIPID PANEL
Cholesterol: 197 mg/dL (ref 28–200)
HDL: 45.3 mg/dL
LDL Cholesterol: 131 mg/dL — ABNORMAL HIGH (ref 10–99)
NonHDL: 151.75
Total CHOL/HDL Ratio: 4
Triglycerides: 102 mg/dL (ref 10.0–149.0)
VLDL: 20.4 mg/dL (ref 0.0–40.0)

## 2024-10-07 LAB — PSA: PSA: 1.57 ng/mL (ref 0.10–4.00)

## 2024-10-07 NOTE — Patient Instructions (Signed)
 Colon cancer screening Awaiting Cologuard kit due to shipping delay. Prefers Cologuard over colonoscopy. - Follow up with medical assistant to contact [employer] for Cologuard kit reshipment. - Advised to mail back the Cologuard kit once received and follow up if results are not received in 3-4 weeks.  Hyperglycemia Occasional elevated blood sugar, likely stress-related. Previous levels slightly elevated but normal. - Ordered A1c test to assess average blood sugar levels over the past three months. - Ordered kidney function tests and liver enzyme tests.  Chronic kidney disease, stage 1 Kidney function well-managed with GFR of 80. Previous GFR of 58 likely due to dehydration. - Ordered kidney function tests to monitor GFR.  Hyperlipidemia Mild elevated cholesterol with slightly elevated LDL. - Ordered lipid panel to assess current cholesterol levels.  Lower urinary tract symptoms Reports nocturia without pain or symptoms suggestive of prostate issues. - Ordered PSA .  Thoracic aortic aneurysm, stable Stable mild aneurysm dilation of aortic root, maximum diameter 5.1 cm. Recent MRI showed no significant changes. - Continue annual MRI surveillance of the thoracic aorta.  Staff following up on why you have not gotten cologuard kit.  Follow up date to be determined after lab review. Recommend /offer every 6 months.

## 2024-10-07 NOTE — Progress Notes (Signed)
 "  Subjective:    Patient ID: Johnny [name], male    DOB: 03-11-1956, 69 y.o.   MRN: [id number]  HPI    Pt in for general check up. Labs not done since 2022.  Last general check up in 2022 AVS below.   Thoracic aortic aneurysm-patient's thoracic aneurysm has not changed in size.  We will plan to repeat CTA June 2023.   Status post mitral valve repair-we discussed the importance of SBE prophylaxis.    Hyperlipidemia but ct coronary score was 0. Advise low cholesterol diet.   B12 high. Advise skip b12 dose on Sunday. Recheck b12 on Monday.   High potassium on lab. Avoid high potassium foods. Repeat cmp/k level on Monday.    Johnny [name] is a 69 year old male who presents for follow-up on Cologuard testing and elevated blood sugar levels.  He did not receive the Cologuard kit that was to be mailed 3 to 4 weeks ago. He previously completed a stool test that was negative for blood but has never had colonoscopy or other colorectal cancer screening.  He has elevated blood sugars, including 149 mg/dL during a wrist fracture hospitalization in February 2024, with prior values typically around 102 to 105 mg/dL. He has frequent urination, waking 2 to 3 times nightly. He denies difficulty initiating urination, dribbling, or perineal pain.  He has mild hyperlipidemia and prior variable kidney function, with a GFR of 58 during a hospitalization. He has an aortic root aneurysm monitored annually, with MRI in December 2025 showing stable mild dilation measuring 5.1 cm.  He eats a diet rich in meat and vegetables and is aware of the need for good hydration, though he drinks less water than his wife.   Review of Systems  Constitutional:  Negative for fatigue.  HENT:  Negative for congestion.   Respiratory:  Negative for chest tightness, shortness of breath and wheezing.   Cardiovascular:  Negative for chest pain and palpitations.  Gastrointestinal:  Negative for abdominal pain,  diarrhea and vomiting.  Genitourinary:  Positive for frequency. Negative for dysuria and urgency.       Mild frequency of urination  Musculoskeletal:  Negative for back pain.  Skin:  Negative for rash.  Neurological:  Negative for dizziness and numbness.  Hematological:  Negative for adenopathy.  Psychiatric/Behavioral:  Negative for behavioral problems and dysphoric mood. The patient is not nervous/anxious.     Past Medical History:  Diagnosis Date   Atrial fibrillation (HCC)    Chronic insomnia    Dysrhythmia    afib   Heart murmur    History of rheumatic fever    age 49   Mitral regurgitation    Patent foramen ovale    S/P Maze operation for atrial fibrillation 03/13/2021   Left side atrial lesion set using cryothermy with clipping of LA appendage   S/P minimally-invasive mitral valve repair 03/13/2021   Complex valvuloplasty including artificial PTFE neochord placement x4 with 36 mm Medtronic Simuform ring annuloplasty   Thoracic aortic aneurysm      Social History   Socioeconomic History   Marital status: Married    Spouse name: Not on file   Number of children: Not on file   Years of education: Not on file   Highest education level: Bachelor's degree (e.g., BA, AB, BS)  Occupational History   Not on file  Tobacco Use   Smoking status: Never   Smokeless tobacco: Never  Vaping Use   Vaping status:  Never Used  Substance and Sexual Activity   Alcohol use: No   Drug use: No   Sexual activity: Yes  Other Topics Concern   Not on file  Social History Narrative          [employer]   Married   2 daughters ages 64, 76      Social Drivers of Health   Tobacco Use: Low Risk (09/04/2024)   Patient History    Smoking Tobacco Use: Never    Smokeless Tobacco Use: Never    Passive Exposure: Not on file  Financial Resource Strain: Low Risk (08/31/2024)   Overall Financial Resource Strain (CARDIA)    Difficulty of Paying Living Expenses: Not hard at  all  Food Insecurity: No Food Insecurity (08/31/2024)   Epic    Worried About Radiation Protection Practitioner of Food in the Last Year: Never true    Ran Out of Food in the Last Year: Never true  Transportation Needs: No Transportation Needs (08/31/2024)   Epic    Lack of Transportation (Medical): No    Lack of Transportation (Non-Medical): No  Physical Activity: Sufficiently Active (08/31/2024)   Exercise Vital Sign    Days of Exercise per Week: 7 days    Minutes of Exercise per Session: 30 min  Stress: No Stress Concern Present (08/31/2024)   [employer] of Occupational Health - Occupational Stress Questionnaire    Feeling of Stress: Not at all  Social Connections: Socially Integrated (08/31/2024)   Social Connection and Isolation Panel    Frequency of Communication with Friends and Family: More than three times a week    Frequency of Social Gatherings with Friends and Family: More than three times a week    Attends Religious Services: More than 4 times per year    Active Member of [employer] or Organizations: Yes    Attends Banker Meetings: More than 4 times per year    Marital Status: Married  Catering Manager Violence: Not At Risk (09/04/2024)   Epic    Fear of Current or Ex-Partner: No    Emotionally Abused: No    Physically Abused: No    Sexually Abused: No  Depression (PHQ2-9): Low Risk (09/04/2024)   Depression (PHQ2-9)    PHQ-2 Score: 0  Alcohol Screen: Not on file  Housing: Low Risk (08/31/2024)   Epic    Unable to Pay for Housing in the Last Year: No    Number of Times Moved in the Last Year: 0    Homeless in the Last Year: No  Utilities: Not At Risk (09/04/2024)   Epic    Threatened with loss of utilities: No  Health Literacy: Not on file    Past Surgical History:  Procedure Laterality Date   CARDIAC CATHETERIZATION  02/10/2021   CARDIOVERSION     CLIPPING OF ATRIAL APPENDAGE  03/13/2021   Procedure: CLIPPING OF ATRIAL APPENDAGE  USING ATRICURE CLIP PRO2 SIZE  ;  Surgeon: Dusty Sudie DEL, MD;  Location: [facility] OR;  Service: Open Heart Surgery;;   MAZE N/A 03/13/2021   Procedure: MAZE;  Surgeon: Dusty Sudie DEL, MD;  Location: [facility] OR;  Service: Open Heart Surgery;  Laterality: N/A;   MITRAL VALVE REPAIR Right 03/13/2021   Procedure: MINIMALLY INVASIVE MITRAL VALVE REPAIR (MVR) USING MEDTRONIC SIMUFORM RING SIZE ;  Surgeon: Dusty Sudie DEL, MD;  Location: [facility] OR;  Service: Open Heart Surgery;  Laterality: Right;   RIGHT/LEFT HEART CATH AND CORONARY ANGIOGRAPHY N/A 02/10/2021  Procedure: RIGHT/LEFT HEART CATH AND CORONARY ANGIOGRAPHY;  Surgeon: Wonda Sharper, MD;  Location: [facility] INVASIVE CV LAB;  Service: Cardiovascular;  Laterality: N/A;   TEE WITHOUT CARDIOVERSION N/A 01/16/2021   Procedure: TRANSESOPHAGEAL ECHOCARDIOGRAM (TEE);  Surgeon: Pietro Redell RAMAN, MD;  Location: [facility] ENDOSCOPY;  Service: Cardiovascular;  Laterality: N/A;   TEE WITHOUT CARDIOVERSION N/A 03/13/2021   Procedure: TRANSESOPHAGEAL ECHOCARDIOGRAM (TEE);  Surgeon: Dusty Sudie DEL, MD;  Location: [facility] OR;  Service: Open Heart Surgery;  Laterality: N/A;    Family History  Problem Relation Age of Onset   Pulmonary fibrosis Mother    Diverticulosis Father    Lung cancer Sister    Parkinson's disease Sister     Allergies[1]  Medications Ordered Prior to Encounter[2]  BP 118/80   Pulse 93   Temp 97.7 F (36.5 C) (Oral)   Resp 16   Ht 6' (1.829 m)   Wt 242 lb 9.6 oz (110 kg)   SpO2 98%   BMI 32.90 kg/m        Objective:   Physical Exam  General Mental Status- Alert. General Appearance- Not in acute distress.   Skin General: Color- Normal Color. Moisture- Normal Moisture.  Neck Carotid Arteries- Normal color. Moisture- Normal Moisture. No carotid bruits. No JVD.  Chest and Lung Exam Auscultation: Breath Sounds:-CTA  Cardiovascular Auscultation:Rythm- RRR Murmurs & Other Heart Sounds:Auscultation of the heart reveals- No  Murmurs.  Abdomen Inspection:-Inspeection Normal. Palpation/Percussion:Note:No mass. Palpation and Percussion of the abdomen reveal- Non Tender, Non Distended + BS, no rebound or guarding.    Neurologic Cranial Nerve exam:- CN III-XII intact(No nystagmus), symmetric smile. Strength:- 5/5 equal and symmetric strength both upper and lower extremities.    Skin- small scattered freckles and moles. Non worrisome.     Assessment & Plan:   Patient Instructions  Colon cancer screening Awaiting Cologuard kit due to shipping delay. Prefers Cologuard over colonoscopy. - Follow up with medical assistant to contact [facility] for Cologuard kit reshipment. - Advised to mail back the Cologuard kit once received and follow up if results are not received in 3-4 weeks.  Hyperglycemia Occasional elevated blood sugar, likely stress-related. Previous levels slightly elevated but normal. - Ordered A1c test to assess average blood sugar levels over the past three months. - Ordered kidney function tests and liver enzyme tests.  Chronic kidney disease, stage 1 Kidney function well-managed with GFR of 80. Previous GFR of 58 likely due to dehydration. - Ordered kidney function tests to monitor GFR.  Hyperlipidemia Mild elevated cholesterol with slightly elevated LDL. - Ordered lipid panel to assess current cholesterol levels.  Lower urinary tract symptoms Reports nocturia without pain or symptoms suggestive of prostate issues. - Ordered PSA .  Thoracic aortic aneurysm, stable Stable mild aneurysm dilation of aortic root, maximum diameter 5.1 cm. Recent MRI showed no significant changes. - Continue annual MRI surveillance of the thoracic aorta.  Staff following up on why you have not gotten cologuard kit.  Follow up date to be determined after lab review. Recommend /offer every 6 months.   Aubriauna Riner, PA-C     [1]  Allergies Allergen Reactions   Bee Venom Anaphylaxis    Antihistamines, Chlorpheniramine-Type Other (See Comments)    Acts as a depressant with pt.  [2]  Current Outpatient Medications on File Prior to Visit  Medication Sig Dispense Refill   Cholecalciferol (VITAMIN D3) 250 MCG (10000 UT) capsule Take 10,000 Units by mouth in the morning.     Cyanocobalamin  (B-12) 5000  MCG SUBL Place 2 each under the tongue daily.     MAGNESIUM  PO Take 12 mg by mouth daily.     Multiple Vitamin (MULTIVITAMIN WITH MINERALS) TABS tablet Take 1 tablet by mouth in the morning.     Zinc 50 MG TABS Take 50 mg by mouth daily.     No current facility-administered medications on file prior to visit.   "

## 2024-10-07 NOTE — Telephone Encounter (Signed)
 Pt in office and said he never received Cologuard ordered 09/04/24. Called [facility] and they cannot see order in system. Called IT and they placed a new order. Called [facility] and today's order went through. Pt should be receiving it in the mail within a week.

## 2024-10-22 ENCOUNTER — Ambulatory Visit: Payer: Self-pay | Admitting: Medical

## 2024-10-22 LAB — COLOGUARD: COLOGUARD: NEGATIVE
